# Patient Record
Sex: Male | Born: 1935 | Race: Black or African American | Hispanic: No | Smoking: Never smoker
Health system: Southern US, Community
[De-identification: ages and names within clinical notes are randomized; demographics above are authoritative.]

## PROBLEM LIST (undated history)

## (undated) DIAGNOSIS — J449 Chronic obstructive pulmonary disease, unspecified: Secondary | ICD-10-CM

## (undated) DIAGNOSIS — I509 Heart failure, unspecified: Secondary | ICD-10-CM

## (undated) DIAGNOSIS — C801 Malignant (primary) neoplasm, unspecified: Secondary | ICD-10-CM

## (undated) DIAGNOSIS — E785 Hyperlipidemia, unspecified: Secondary | ICD-10-CM

## (undated) DIAGNOSIS — I2510 Atherosclerotic heart disease of native coronary artery without angina pectoris: Secondary | ICD-10-CM

## (undated) DIAGNOSIS — F0390 Unspecified dementia without behavioral disturbance: Secondary | ICD-10-CM

## (undated) DIAGNOSIS — I429 Cardiomyopathy, unspecified: Secondary | ICD-10-CM

## (undated) DIAGNOSIS — I10 Essential (primary) hypertension: Secondary | ICD-10-CM

## (undated) DIAGNOSIS — M1990 Unspecified osteoarthritis, unspecified site: Secondary | ICD-10-CM

## (undated) HISTORY — DX: Cardiomyopathy, unspecified: I42.9

## (undated) HISTORY — DX: Unspecified osteoarthritis, unspecified site: M19.90

## (undated) HISTORY — PX: CARDIAC PACEMAKER PLACEMENT: SHX583

## (undated) HISTORY — DX: Hyperlipidemia, unspecified: E78.5

## (undated) HISTORY — DX: Unspecified dementia, unspecified severity, without behavioral disturbance, psychotic disturbance, mood disturbance, and anxiety: F03.90

---

## 2009-02-28 ENCOUNTER — Ambulatory Visit: Payer: Self-pay | Admitting: Diagnostic Radiology

## 2009-02-28 ENCOUNTER — Emergency Department (HOSPITAL_BASED_OUTPATIENT_CLINIC_OR_DEPARTMENT_OTHER): Admission: EM | Admit: 2009-02-28 | Discharge: 2009-02-28 | Payer: Self-pay | Admitting: Emergency Medicine

## 2010-08-24 LAB — BASIC METABOLIC PANEL
Chloride: 107 mEq/L (ref 96–112)
Creatinine, Ser: 1.2 mg/dL (ref 0.4–1.5)
GFR calc Af Amer: >60 mL/min (ref >60)
GFR calc non Af Amer: 58 mL/min — ABNORMAL LOW (ref >60)

## 2010-08-24 LAB — POCT CARDIAC MARKERS
CKMB, poc: 2 ng/mL (ref 1.0–8.0)
Myoglobin, poc: 173 ng/mL (ref 12–200)
Troponin i, poc: <0.05 ng/mL (ref 0.00–0.09)

## 2010-08-24 LAB — DIFFERENTIAL
Lymphocytes Relative: 8 % — ABNORMAL LOW (ref 12–46)
Lymphs Abs: 1.1 10*3/uL (ref 0.7–4.0)
Monocytes Absolute: 0.8 10*3/uL (ref 0.1–1.0)
Neutrophils Relative %: 85 % — ABNORMAL HIGH (ref 43–77)

## 2010-08-24 LAB — CBC
MCV: 100.1 fL — ABNORMAL HIGH (ref 78.0–100.0)
Platelets: 227 10*3/uL (ref 150–400)
RBC: 3.5 MIL/uL — ABNORMAL LOW (ref 4.22–5.81)
RDW: 13.2 % (ref 11.5–15.5)
WBC: 14.4 10*3/uL — ABNORMAL HIGH (ref 4.0–10.5)

## 2011-08-20 DIAGNOSIS — M549 Dorsalgia, unspecified: Secondary | ICD-10-CM | POA: Insufficient documentation

## 2012-10-25 DIAGNOSIS — Z7189 Other specified counseling: Secondary | ICD-10-CM | POA: Insufficient documentation

## 2012-10-25 DIAGNOSIS — R209 Unspecified disturbances of skin sensation: Secondary | ICD-10-CM | POA: Insufficient documentation

## 2012-10-25 DIAGNOSIS — M159 Polyosteoarthritis, unspecified: Secondary | ICD-10-CM | POA: Insufficient documentation

## 2012-10-25 DIAGNOSIS — R269 Unspecified abnormalities of gait and mobility: Secondary | ICD-10-CM | POA: Insufficient documentation

## 2013-03-02 DIAGNOSIS — R7309 Other abnormal glucose: Secondary | ICD-10-CM | POA: Insufficient documentation

## 2013-03-02 DIAGNOSIS — G4700 Insomnia, unspecified: Secondary | ICD-10-CM | POA: Insufficient documentation

## 2013-03-02 DIAGNOSIS — R197 Diarrhea, unspecified: Secondary | ICD-10-CM | POA: Insufficient documentation

## 2013-06-11 DIAGNOSIS — I509 Heart failure, unspecified: Secondary | ICD-10-CM | POA: Insufficient documentation

## 2013-06-11 DIAGNOSIS — F0391 Unspecified dementia with behavioral disturbance: Secondary | ICD-10-CM | POA: Insufficient documentation

## 2013-06-11 DIAGNOSIS — I428 Other cardiomyopathies: Secondary | ICD-10-CM | POA: Insufficient documentation

## 2013-06-11 DIAGNOSIS — I429 Cardiomyopathy, unspecified: Secondary | ICD-10-CM | POA: Insufficient documentation

## 2013-06-11 DIAGNOSIS — I10 Essential (primary) hypertension: Secondary | ICD-10-CM | POA: Insufficient documentation

## 2013-06-11 DIAGNOSIS — F03918 Unspecified dementia, unspecified severity, with other behavioral disturbance: Secondary | ICD-10-CM | POA: Insufficient documentation

## 2013-06-11 DIAGNOSIS — I5022 Chronic systolic (congestive) heart failure: Secondary | ICD-10-CM | POA: Insufficient documentation

## 2013-06-11 DIAGNOSIS — F0390 Unspecified dementia without behavioral disturbance: Secondary | ICD-10-CM

## 2013-12-24 DIAGNOSIS — R200 Anesthesia of skin: Secondary | ICD-10-CM | POA: Insufficient documentation

## 2013-12-24 DIAGNOSIS — R739 Hyperglycemia, unspecified: Secondary | ICD-10-CM | POA: Insufficient documentation

## 2013-12-24 DIAGNOSIS — M25551 Pain in right hip: Secondary | ICD-10-CM | POA: Insufficient documentation

## 2013-12-24 DIAGNOSIS — N400 Enlarged prostate without lower urinary tract symptoms: Secondary | ICD-10-CM | POA: Insufficient documentation

## 2014-05-10 DIAGNOSIS — E785 Hyperlipidemia, unspecified: Secondary | ICD-10-CM | POA: Insufficient documentation

## 2014-08-30 DIAGNOSIS — M1611 Unilateral primary osteoarthritis, right hip: Secondary | ICD-10-CM | POA: Insufficient documentation

## 2014-11-25 DIAGNOSIS — Z95810 Presence of automatic (implantable) cardiac defibrillator: Secondary | ICD-10-CM | POA: Insufficient documentation

## 2014-11-26 DIAGNOSIS — J439 Emphysema, unspecified: Secondary | ICD-10-CM | POA: Insufficient documentation

## 2015-07-08 DIAGNOSIS — R32 Unspecified urinary incontinence: Secondary | ICD-10-CM | POA: Insufficient documentation

## 2015-07-29 DIAGNOSIS — E559 Vitamin D deficiency, unspecified: Secondary | ICD-10-CM | POA: Insufficient documentation

## 2015-11-16 DIAGNOSIS — H547 Unspecified visual loss: Secondary | ICD-10-CM | POA: Insufficient documentation

## 2015-11-16 DIAGNOSIS — Z9181 History of falling: Secondary | ICD-10-CM | POA: Insufficient documentation

## 2016-02-15 DIAGNOSIS — J3089 Other allergic rhinitis: Secondary | ICD-10-CM | POA: Insufficient documentation

## 2016-11-09 DIAGNOSIS — D649 Anemia, unspecified: Secondary | ICD-10-CM | POA: Insufficient documentation

## 2017-01-25 DIAGNOSIS — S2241XA Multiple fractures of ribs, right side, initial encounter for closed fracture: Secondary | ICD-10-CM | POA: Insufficient documentation

## 2017-05-26 DIAGNOSIS — M48061 Spinal stenosis, lumbar region without neurogenic claudication: Secondary | ICD-10-CM | POA: Insufficient documentation

## 2017-06-21 DIAGNOSIS — R3915 Urgency of urination: Secondary | ICD-10-CM | POA: Insufficient documentation

## 2017-10-20 ENCOUNTER — Emergency Department (HOSPITAL_BASED_OUTPATIENT_CLINIC_OR_DEPARTMENT_OTHER)
Admission: EM | Admit: 2017-10-20 | Discharge: 2017-10-20 | Disposition: A | Discharge status: Home / Self Care | Payer: Medicare Other | Attending: Emergency Medicine | Admitting: Emergency Medicine

## 2017-10-20 ENCOUNTER — Other Ambulatory Visit: Payer: Self-pay

## 2017-10-20 ENCOUNTER — Encounter (HOSPITAL_BASED_OUTPATIENT_CLINIC_OR_DEPARTMENT_OTHER): Payer: Self-pay | Admitting: Emergency Medicine

## 2017-10-20 DIAGNOSIS — Y998 Other external cause status: Secondary | ICD-10-CM | POA: Insufficient documentation

## 2017-10-20 DIAGNOSIS — J449 Chronic obstructive pulmonary disease, unspecified: Secondary | ICD-10-CM | POA: Diagnosis not present

## 2017-10-20 DIAGNOSIS — I2510 Atherosclerotic heart disease of native coronary artery without angina pectoris: Secondary | ICD-10-CM | POA: Insufficient documentation

## 2017-10-20 DIAGNOSIS — Y9389 Activity, other specified: Secondary | ICD-10-CM | POA: Diagnosis not present

## 2017-10-20 DIAGNOSIS — Z79899 Other long term (current) drug therapy: Secondary | ICD-10-CM | POA: Insufficient documentation

## 2017-10-20 DIAGNOSIS — Z7902 Long term (current) use of antithrombotics/antiplatelets: Secondary | ICD-10-CM | POA: Diagnosis not present

## 2017-10-20 DIAGNOSIS — I5022 Chronic systolic (congestive) heart failure: Secondary | ICD-10-CM | POA: Diagnosis not present

## 2017-10-20 DIAGNOSIS — I110 Hypertensive heart disease with heart failure: Secondary | ICD-10-CM | POA: Diagnosis not present

## 2017-10-20 DIAGNOSIS — Z859 Personal history of malignant neoplasm, unspecified: Secondary | ICD-10-CM | POA: Insufficient documentation

## 2017-10-20 DIAGNOSIS — S6992XA Unspecified injury of left wrist, hand and finger(s), initial encounter: Secondary | ICD-10-CM | POA: Diagnosis present

## 2017-10-20 DIAGNOSIS — S61412A Laceration without foreign body of left hand, initial encounter: Secondary | ICD-10-CM

## 2017-10-20 DIAGNOSIS — W228XXA Striking against or struck by other objects, initial encounter: Secondary | ICD-10-CM | POA: Diagnosis not present

## 2017-10-20 DIAGNOSIS — Y929 Unspecified place or not applicable: Secondary | ICD-10-CM | POA: Diagnosis not present

## 2017-10-20 DIAGNOSIS — Z23 Encounter for immunization: Secondary | ICD-10-CM | POA: Insufficient documentation

## 2017-10-20 HISTORY — DX: Essential (primary) hypertension: I10

## 2017-10-20 HISTORY — DX: Malignant (primary) neoplasm, unspecified: C80.1

## 2017-10-20 HISTORY — DX: Heart failure, unspecified: I50.9

## 2017-10-20 HISTORY — DX: Atherosclerotic heart disease of native coronary artery without angina pectoris: I25.10

## 2017-10-20 HISTORY — DX: Chronic obstructive pulmonary disease, unspecified: J44.9

## 2017-10-20 MED ORDER — TETANUS-DIPHTH-ACELL PERTUSSIS 5-2.5-18.5 LF-MCG/0.5 IM SUSP
0.5000 mL | Freq: Once | INTRAMUSCULAR | Status: AC
  Administered 2017-10-20: 0.5 mL via INTRAMUSCULAR
  Filled 2017-10-20: qty 0.5

## 2017-10-20 NOTE — ED Provider Notes (Signed)
[facility] Provider Note   CSN: [id number] Arrival date & time: 10/20/17  1945     History   Chief Complaint Chief Complaint  Patient presents with  . Extremity Laceration    HPI Maurice Mckee is a 84 y.o. male.  Patient is a 84 year old male with a history of CHF, COPD, coronary artery disease and hypertension who presents with an injury to his left hand.  He was opening a door and scraped his hand on the latch.  He has 2 skin tears on his hand.  He has no other injuries.  Is unsure when his last tetanus shot was.     Past Medical History:  Diagnosis Date  . Cancer ([place])   . CHF (congestive heart failure) ([place])   . COPD (chronic obstructive pulmonary disease) ([place])   . Coronary artery disease   . Hypertension     Patient Active Problem List   Diagnosis Date Noted  . 'light-for-dates' infant with signs of fetal malnutrition 09/09/2017  . Urinary urgency 06/21/2017  . Spinal stenosis of lumbar region 05/26/2017  . Fracture of two ribs of right side, closed, initial encounter 01/25/2017  . Anemia, normocytic normochromic 11/09/2016  . Non-seasonal allergic rhinitis 02/15/2016  . Risk for falls 11/16/2015  . Vision problem 11/16/2015  . Vitamin D deficiency 07/29/2015  . Urinary incontinence 07/08/2015  . Pulmonary emphysema ([place]) 11/26/2014  . 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 Primary osteoarthritis of right hip 08/30/2014  . Hyperlipidemia 05/10/2014  . Arthralgia of right hip 12/24/2013  . Enlarged prostate without lower urinary tract symptoms (luts) 12/24/2013  . Hyperglycemia 12/24/2013  . Numbness 12/24/2013  . Chronic systolic congestive heart failure ([employer]) 06/11/2013  . Congestive heart failure ([place]) 06/11/2013  . Dementia without behavioral disturbance 06/11/2013  . Essential hypertension 06/11/2013  . Primary cardiomyopathy ([place]) 06/11/2013  . Abnormal glucose 03/02/2013  . Diarrhea  03/02/2013  . Insomnia 03/02/2013  . Abnormality of gait 10/25/2012  . Encounter for counseling 10/25/2012  . Generalized osteoarthritis of multiple sites 10/25/2012  . Skin sensation disturbance 10/25/2012  . Backache 08/20/2011  . Other allergy, other than to medicinal agents 01/19/2010    History reviewed. No pertinent surgical history.      Home Medications    Prior to Admission medications   Medication Sig Start Date End Date Taking? Authorizing Provider  atorvastatin (LIPITOR) 40 MG tablet 1 tablet. 11/09/16  Yes [provider]  carvedilol (COREG) 12.5 MG tablet Take 1 tablet by mouth. 11/09/16  Yes [provider]  clopidogrel (PLAVIX) 75 MG tablet 1 tablet. 11/09/16  Yes [provider]  donepezil (ARICEPT) 10 MG tablet Take 1 tablet by mouth. 11/09/16  Yes [provider]  furosemide (LASIX) 20 MG tablet Take 1 tablet by mouth. 07/23/16  Yes [provider]  gabapentin (NEURONTIN) 100 MG capsule 100 mg. 10/18/16  Yes [provider]  memantine (NAMENDA) 10 MG tablet Take 1 tablet by mouth. 08/28/17  Yes [provider]  potassium chloride (K-DUR,KLOR-CON) 10 MEQ tablet Take 1 tablet by mouth. 11/09/16  Yes [provider]  solifenacin (VESICARE) 5 MG tablet Take 1 tablet by mouth. 11/09/16 11/09/17 Yes [provider]  Calcium Carbonate-Vitamin D3 600-400 MG-UNIT TABS Take 1 tablet by mouth.    [provider]  ferrous gluconate (FERGON) 324 MG tablet Take 1 tablet by mouth.    [provider]    Family History History reviewed. No pertinent family  history.  Social History Social History   Tobacco Use  . Smoking status: Never Smoker  . Smokeless tobacco: Never Used  Substance Use Topics  . Alcohol use: Never    Frequency: Never  . Drug use: Never     Allergies   Patient has no known allergies.   Review of Systems Review of Systems  Constitutional: Negative for fever.   Gastrointestinal: Negative for nausea and vomiting.  Musculoskeletal: Negative for arthralgias, back pain, joint swelling and neck pain.  Skin: Positive for wound.  Neurological: Negative for weakness, numbness and headaches.     Physical Exam Updated Vital Signs BP 137/78 (BP Location: Right Arm)   Pulse 72   Temp 98 F (36.7 C) (Oral)   Resp 20   Ht 5\' 11"  (1.803 m)   Wt 65.8 kg (145 lb)   SpO2 95%   BMI 20.22 kg/m   Physical Exam  Constitutional: He is oriented to person, place, and time. He appears well-developed and well-nourished.  HENT:  Head: Normocephalic and atraumatic.  Neck: Normal range of motion. Neck supple.  Cardiovascular: Normal rate.  Pulmonary/Chest: Effort normal.  Musculoskeletal: He exhibits no edema or tenderness.  Patient has a 2 to 3 cm skin tear on the dorsal surface of his left hand overlying the fourth and fifth metacarpal bones.  He also has a small less than 1 cm skin tear on the dorsal surface of his fifth finger.  There is no underlying bony tenderness.  No active bleeding.  He has normal sensation and motor function of the hand.  Capillary refill normal distally.  Neurological: He is alert and oriented to person, place, and time.  Skin: Skin is warm and dry.  Psychiatric: He has a normal mood and affect.     ED Treatments / Results  Labs (all labs ordered are listed, but only abnormal results are displayed) Labs Reviewed - No data to display  EKG None  Radiology No results found.  Procedures Procedures (including critical care time)  Medications Ordered in ED Medications  Tdap (BOOSTRIX) injection 0.5 mL (0.5 mLs Intramuscular Given 10/20/17 2311)     Initial Impression / Assessment and Plan / ED Course  I have reviewed the triage vital signs and the nursing notes.  Pertinent labs & imaging results that were available during my care of the patient were reviewed by me and considered in my medical decision making (see chart for  details).     Patient has 2 small skin tears to his hand.  They are not suturable.  He has no underlying bony tenderness.  No active bleeding.  His wound was cleaned and dressed.  His tetanus shot was updated.  He was given ongoing wound care instructions and return precautions.  Final Clinical Impressions(s) / ED Diagnoses   Final diagnoses:  Skin tear of left hand without complication, initial encounter    ED Discharge Orders    None       Malvin Johns, MD 10/20/17 2324

## 2017-10-20 NOTE — ED Triage Notes (Signed)
Patient states that he cut the outside of his left hand on the door jam at home. Patient has noted 2 skin tears to his left hand  - bleeding controlled

## 2017-10-20 NOTE — ED Notes (Signed)
Skin tear on left hand dressed and cleansed with mepital dressing and wound cleanser.

## 2018-03-10 ENCOUNTER — Encounter: Payer: Self-pay | Admitting: Internal Medicine

## 2018-03-10 ENCOUNTER — Non-Acute Institutional Stay (SKILLED_NURSING_FACILITY): Payer: Medicare Other | Admitting: Internal Medicine

## 2018-03-10 DIAGNOSIS — G301 Alzheimer's disease with late onset: Secondary | ICD-10-CM

## 2018-03-10 DIAGNOSIS — I10 Essential (primary) hypertension: Secondary | ICD-10-CM

## 2018-03-10 DIAGNOSIS — S32591D Other specified fracture of right pubis, subsequent encounter for fracture with routine healing: Secondary | ICD-10-CM | POA: Diagnosis not present

## 2018-03-10 DIAGNOSIS — R296 Repeated falls: Secondary | ICD-10-CM | POA: Diagnosis not present

## 2018-03-10 DIAGNOSIS — R410 Disorientation, unspecified: Secondary | ICD-10-CM

## 2018-03-10 DIAGNOSIS — F0280 Dementia in other diseases classified elsewhere without behavioral disturbance: Secondary | ICD-10-CM

## 2018-03-10 DIAGNOSIS — N400 Enlarged prostate without lower urinary tract symptoms: Secondary | ICD-10-CM

## 2018-03-10 DIAGNOSIS — E785 Hyperlipidemia, unspecified: Secondary | ICD-10-CM

## 2018-03-10 NOTE — Progress Notes (Signed)
:  Location:  [place] Room Number: [phone] Place of Service:  SNF ([id number])  Marda Breidenbach D. Sheppard Coil, MD  Patient Care Team: Patient, No Pcp Per as PCP - General (General Practice)  Extended Emergency Contact Information Primary Emergency Contact: [place] Phone: [phone] Relation: None     Allergies: Patient has no known allergies.  Chief Complaint  Patient presents with  . New Admit To SNF    Admit to [facility]    HPI: Patient is 84 y.o. male with hypertension, dementia, chronic diastolic and systolic congestive heart failure, BPH, and multiple falls who presented to [facility] emergency department complaining of a fall day with right hip pain since he was taken to his primary care physician where an x-ray of the hip was obtained and showed a remote inferior pubic rami fracture and he was told to come to the emergency department.  Patient was admitted to [facility] from 10/11-18 for multiple falls.  Work-up was negative.  Hospital course was complicated by one episode of confusion and required Haldol.  Also patient had a couple episodes of diarrhea in the hospital which was negative for C. difficile.  Patient is admitted to skilled nursing facility for OT/PT.  While at skilled nursing facility patient will be followed for dementia treated with Aricept, BPH treated with Flomax and hyperlipidemia treated with Lipitor.  Past Medical History:  Diagnosis Date  . Arthritis   . Cancer ([place])   . Cardiomyopathy ([place])   . CHF (congestive heart failure) ([place])   . COPD (chronic obstructive pulmonary disease) ([place])   . Coronary artery disease   . Dementia ([place])   . Hyperlipidemia   . Hypertension     Past Surgical History:  Procedure Laterality Date  . CARDIAC PACEMAKER PLACEMENT      Allergies as of 03/10/2018   No Known Allergies     Medication List        Accurate as of 03/10/18 11:09 AM. Always use your most  recent med list.          atorvastatin 40 MG tablet Commonly known as:  LIPITOR 1 tablet.   carvedilol 12.5 MG tablet Commonly known as:  COREG Take 1 tablet by mouth.   cholecalciferol 1000 units tablet Commonly known as:  VITAMIN D Take 1,000 Units by mouth daily.   clopidogrel 75 MG tablet Commonly known as:  PLAVIX 1 tablet.   diphenoxylate-atropine 2.5-0.025 MG tablet Commonly known as:  LOMOTIL Take by mouth 4 (four) times daily as needed for diarrhea or loose stools.   donepezil 10 MG tablet Commonly known as:  ARICEPT Take 1 tablet by mouth.   ferrous gluconate 324 MG tablet Commonly known as:  FERGON Take 1 tablet by mouth.   tamsulosin 0.4 MG Caps capsule Commonly known as:  FLOMAX Take 0.4 mg by mouth.       No orders of the defined types were placed in this encounter.   Immunization History  Administered Date(s) Administered  . Tdap 10/20/2017    Social History   Tobacco Use  . Smoking status: Never Smoker  . Smokeless tobacco: Never Used  Substance Use Topics  . Alcohol use: Never    Frequency: Never    Family history is   Family History  Problem Relation Age of Onset  . Cancer Mother       Review of Systems  DATA OBTAINED: from patient, nurse GENERAL:  no fevers,  fatigue, appetite changes SKIN: No itching, or rash EYES: No eye pain, redness, discharge EARS: No earache, tinnitus, change in hearing NOSE: No congestion, drainage or bleeding  MOUTH/THROAT: No mouth or tooth pain, No sore throat RESPIRATORY: No cough, wheezing, SOB CARDIAC: No chest pain, palpitations, lower extremity edema  GI: No abdominal pain, No N/V/D or constipation, No heartburn or reflux  GU: No dysuria, frequency or urgency, or incontinence  MUSCULOSKELETAL: No unrelieved bone/joint pain NEUROLOGIC: No headache, dizziness or focal weakness PSYCHIATRIC: No c/o anxiety or sadness   Vitals:   03/10/18 1047  BP: 111/71  Pulse: 77  Resp: 17  Temp:  (!) 97.3 F (36.3 C)    SpO2 Readings from Last 1 Encounters:  10/20/17 99%   There is no height or weight on file to calculate BMI.     Physical Exam  GENERAL APPEARANCE: Alert, conversant,  No acute distress.  SKIN: No diaphoresis rash HEAD: Normocephalic, atraumatic  EYES: Conjunctiva/lids clear. Pupils round, reactive. EOMs intact.  EARS: External exam WNL, canals clear. Hearing grossly normal.  NOSE: No deformity or discharge.  MOUTH/THROAT: Lips w/o lesions  RESPIRATORY: Breathing is even, unlabored. Lung sounds are clear   CARDIOVASCULAR: Heart RRR no murmurs, rubs or gallops. No peripheral edema.   GASTROINTESTINAL: Abdomen is soft, non-tender, not distended w/ normal bowel sounds. GENITOURINARY: Bladder non tender, not distended  MUSCULOSKELETAL: No abnormal joints or musculature NEUROLOGIC:  Cranial nerves 2-12 grossly intact. Moves all extremities  PSYCHIATRIC: Mood and affect appropriate to situation, no behavioral issues  Patient Active Problem List   Diagnosis Date Noted  . 'light-for-dates' infant with signs of fetal malnutrition 09/09/2017  . Urinary urgency 06/21/2017  . Spinal stenosis of lumbar region 05/26/2017  . Fracture of two ribs of right side, closed, initial encounter 01/25/2017  . Anemia, normocytic normochromic 11/09/2016  . Non-seasonal allergic rhinitis 02/15/2016  . Risk for falls 11/16/2015  . Vision problem 11/16/2015  . Vitamin D deficiency 07/29/2015  . Urinary incontinence 07/08/2015  . Pulmonary emphysema ([place]) 11/26/2014  . 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 Primary osteoarthritis of right hip 08/30/2014  . Hyperlipidemia 05/10/2014  . Arthralgia of right hip 12/24/2013  . Enlarged prostate without lower urinary tract symptoms (luts) 12/24/2013  . Hyperglycemia 12/24/2013  . Numbness 12/24/2013  . Chronic systolic congestive heart failure ([place]) 06/11/2013  . Congestive heart failure ([place])  06/11/2013  . Dementia without behavioral disturbance ([place]) 06/11/2013  . Essential hypertension 06/11/2013  . Primary cardiomyopathy ([place]) 06/11/2013  . Abnormal glucose 03/02/2013  . Diarrhea 03/02/2013  . Insomnia 03/02/2013  . Abnormality of gait 10/25/2012  . Encounter for counseling 10/25/2012  . Generalized osteoarthritis of multiple sites 10/25/2012  . Skin sensation disturbance 10/25/2012  . Backache 08/20/2011  . Other allergy, other than to medicinal agents 01/19/2010      Labs reviewed: Basic Metabolic Panel:    Component Value Date/Time   NA 140 02/28/2009 1520   K 4.9 02/28/2009 1520   CL 107 02/28/2009 1520   CO2 28 02/28/2009 1520   GLUCOSE 100 (H) 02/28/2009 1520   BUN 29 (H) 02/28/2009 1520   CREATININE 1.2 02/28/2009 1520   CALCIUM 9.1 02/28/2009 1520   GFRNONAA 58 (L) 02/28/2009 1520   GFRAA  02/28/2009 1520    >60        The eGFR has been calculated using the MDRD equation. This calculation has not been validated in all clinical situations. eGFR's persistently <60 mL/min signify possible Chronic  Kidney Disease.    No results for input(s): NA, K, CL, CO2, GLUCOSE, BUN, CREATININE, CALCIUM, MG, PHOS in the last 8760 hours. Liver Function Tests: No results for input(s): AST, ALT, ALKPHOS, BILITOT, PROT, ALBUMIN in the last 8760 hours. No results for input(s): LIPASE, AMYLASE in the last 8760 hours. No results for input(s): AMMONIA in the last 8760 hours. CBC: No results for input(s): WBC, NEUTROABS, HGB, HCT, MCV, PLT in the last 8760 hours. Lipid No results for input(s): CHOL, HDL, LDLCALC, TRIG in the last 8760 hours.  Cardiac Enzymes: No results for input(s): CKTOTAL, CKMB, CKMBINDEX, TROPONINI in the last 8760 hours. BNP: No results for input(s): BNP in the last 8760 hours. No results found for: MICROALBUR No results found for: HGBA1C No results found for: TSH No results found for: VITAMINB12 No results found for: FOLATE No results  found for: IRON, TIBC, FERRITIN  Imaging and Procedures obtained prior to SNF admission: No results found.   Not all labs, radiology exams or other studies done during hospitalization come through on my EPIC note; however they are reviewed by me.    Assessment and Plan  Multiple falls/remote right inferior pubic rami fracture- fall precautions; ICD was interrogated and was normal, EKG noted atrial paced rhythm. 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 Sheppard Coil, MD

## 2018-03-12 LAB — CBC AND DIFFERENTIAL
HCT: 40 — AB (ref 41–53)
Hemoglobin: 13.4 — AB (ref 13.5–17.5)
Platelets: 151 (ref 150–399)
WBC: 10.1

## 2018-03-12 LAB — BASIC METABOLIC PANEL
BUN: 22 — AB (ref 4–21)
CREATININE: 1 (ref 0.6–1.3)
Glucose: 106
POTASSIUM: 4.4 (ref 3.4–5.3)
Sodium: 141 (ref 137–147)

## 2018-03-18 ENCOUNTER — Encounter: Payer: Self-pay | Admitting: Internal Medicine

## 2018-03-18 DIAGNOSIS — S32599A Other specified fracture of unspecified pubis, initial encounter for closed fracture: Secondary | ICD-10-CM | POA: Insufficient documentation

## 2018-03-18 DIAGNOSIS — N400 Enlarged prostate without lower urinary tract symptoms: Secondary | ICD-10-CM | POA: Insufficient documentation

## 2018-03-18 DIAGNOSIS — R296 Repeated falls: Secondary | ICD-10-CM | POA: Insufficient documentation

## 2018-03-18 DIAGNOSIS — R410 Disorientation, unspecified: Secondary | ICD-10-CM | POA: Insufficient documentation

## 2018-03-25 ENCOUNTER — Non-Acute Institutional Stay (SKILLED_NURSING_FACILITY): Payer: Medicare Other | Admitting: Internal Medicine

## 2018-03-25 ENCOUNTER — Encounter: Payer: Self-pay | Admitting: Internal Medicine

## 2018-03-25 DIAGNOSIS — R410 Disorientation, unspecified: Secondary | ICD-10-CM

## 2018-03-25 DIAGNOSIS — G301 Alzheimer's disease with late onset: Secondary | ICD-10-CM

## 2018-03-25 DIAGNOSIS — R296 Repeated falls: Secondary | ICD-10-CM | POA: Diagnosis not present

## 2018-03-25 DIAGNOSIS — I10 Essential (primary) hypertension: Secondary | ICD-10-CM

## 2018-03-25 DIAGNOSIS — F0280 Dementia in other diseases classified elsewhere without behavioral disturbance: Secondary | ICD-10-CM

## 2018-03-25 DIAGNOSIS — S32591D Other specified fracture of right pubis, subsequent encounter for fracture with routine healing: Secondary | ICD-10-CM | POA: Diagnosis not present

## 2018-03-25 DIAGNOSIS — N400 Enlarged prostate without lower urinary tract symptoms: Secondary | ICD-10-CM

## 2018-03-25 DIAGNOSIS — E785 Hyperlipidemia, unspecified: Secondary | ICD-10-CM

## 2018-03-25 LAB — BASIC METABOLIC PANEL
BUN: 19 (ref 4–21)
Creatinine: 1.1 (ref 0.6–1.3)
Glucose: 137
POTASSIUM: 4 (ref 3.4–5.3)
SODIUM: 140 (ref 137–147)

## 2018-03-25 NOTE — Progress Notes (Signed)
Location:  [place] Room Number: [phone] Place of Service:  SNF [phone])  Maurice Delaine. Sheppard Coil, MD  Patient Care Team: Patient, Maurice Mckee Per as Mckee - General (General Practice)  Extended Emergency Contact Information Primary Emergency Contact: [place] Phone: [phone] Relation: None  Maurice Mckee Allergies  Chief Complaint  Patient presents with  . Discharge Note    Discharge from [facility]    HPI:  84 y.o. male with hypertension, dementia, chronic diastolic and systolic congestive heart failure, BPH, and multiple falls who presented to [facility] emergency department complaining of a fall the day prior with right hip pain since.  He was taken to his primary care physician where an x-ray of the hip was obtained and showed a remote inferior pubic ramus fracture and he was told to come to the emergency department.  Patient was admitted to [facility] from 10/11-18 for multiple falls.  Work-up was negative.  Hospital course was complicated by one episode of confusion which required Haldol.  Also patient had a couple episodes of diarrhea in the hospital which were negative for C. difficile.  Patient was admitted to skilled nursing facility for OT/PT and is now ready to be discharged home.    Past Medical History:  Diagnosis Date  . Arthritis   . Cancer ([place])   . Cardiomyopathy ([place])   . CHF (congestive heart failure) ([place])   . COPD (chronic obstructive pulmonary disease) ([place])   . Coronary artery disease   . Dementia ([place])   . Hyperlipidemia   . Hypertension     Past Surgical History:  Procedure Laterality Date  . CARDIAC PACEMAKER PLACEMENT       reports that he has never smoked. He has never used smokeless tobacco. He reports that he does not drink alcohol or use drugs. Social History   Socioeconomic History  . Marital status: Widowed    Spouse Mckee: Not on file  . Number of children: Not on file  . Years of  education: Not on file  . Highest education level: Not on file  Occupational History  . Not on file  Social Needs  . Financial resource strain: Not on file  . Food insecurity:    Worry: Not on file    Inability: Not on file  . Transportation needs:    Medical: Not on file    Non-medical: Not on file  Tobacco Use  . Smoking status: Never Smoker  . Smokeless tobacco: Never Used  Substance and Sexual Activity  . Alcohol use: Never    Frequency: Never  . Drug use: Never  . Sexual activity: Not on file  Lifestyle  . Physical activity:    Days per week: Not on file    Minutes per session: Not on file  . Stress: Not on file  Relationships  . Social connections:    Talks on phone: Not on file    Gets together: Not on file    Attends religious service: Not on file    Active member of club or organization: Not on file    Attends meetings of clubs or organizations: Not on file    Relationship status: Not on file  . Intimate partner violence:    Fear of current or ex partner: Not on file    Emotionally abused: Not on file    Physically abused: Not on file    Forced sexual activity: Not on file  Other Topics Concern  . Not on file  Social History Narrative  . Not on file    Pertinent  Health Maintenance Due  Topic Date Due  . PNA vac Low Risk Adult (1 of 2 - PCV13) 11/22/1992  . INFLUENZA VACCINE  12/19/2017    Medications: Allergies as of 03/25/2018   Maurice Mckee Allergies     Medication List        Accurate as of 03/25/18  7:14 PM. Always use your most recent med list.          atorvastatin 40 MG tablet Commonly Mckee as:  LIPITOR 1 tablet.   carvedilol 12.5 MG tablet Commonly Mckee as:  COREG Take 1 tablet by mouth.   cholecalciferol 1000 units tablet Commonly Mckee as:  VITAMIN D Take 1,000 Units by mouth daily.   clopidogrel 75 MG tablet Commonly Mckee as:  PLAVIX 1 tablet.   donepezil 10 MG tablet Commonly Mckee as:  ARICEPT Take 10 mg by mouth at  bedtime.   furosemide 40 MG tablet Commonly Mckee as:  LASIX Take 40 mg by mouth daily.   tamsulosin 0.4 MG Caps capsule Commonly Mckee as:  FLOMAX Take 0.4 mg by mouth.        Vitals:   03/25/18 0958  BP: 110/62  Pulse: 69  Resp: 18  Temp: 97.9 F (36.6 C)  Weight: 145 lb (65.8 kg)  Height: 5\' 11"  (1.803 m)   Body mass index is 20.22 kg/m.  Physical Exam  GENERAL APPEARANCE: Alert, conversant. Maurice acute distress.  HEENT: Unremarkable. RESPIRATORY: Breathing is even, unlabored. Lung sounds are clear   CARDIOVASCULAR: Heart RRR Maurice murmurs, rubs or gallops. Maurice peripheral edema.  GASTROINTESTINAL: Abdomen is soft, non-tender, not distended w/ normal bowel sounds.  NEUROLOGIC: Cranial nerves 2-12 grossly intact. Moves all extremities   Labs reviewed: Basic Metabolic Panel: Recent Labs    03/12/18  NA 141  K 4.4  BUN 22*  CREATININE 1.0   Maurice results found for: [facility] Liver Function Tests: Maurice results for input(s): AST, ALT, ALKPHOS, BILITOT, PROT, ALBUMIN in the last 8760 hours. Maurice results for input(s): LIPASE, AMYLASE in the last 8760 hours. Maurice results for input(s): AMMONIA in the last 8760 hours. CBC: Recent Labs    03/12/18  WBC 10.1  HGB 13.4*  HCT 40*  PLT 151   Lipid Maurice results for input(s): CHOL, HDL, LDLCALC, TRIG in the last 8760 hours. Cardiac Enzymes: Maurice results for input(s): CKTOTAL, CKMB, CKMBINDEX, TROPONINI in the last 8760 hours. BNP: No results for input(s): BNP in the last 8760 hours. CBG: Maurice results for input(s): GLUCAP in the last 8760 hours.  Procedures and Imaging Studies During Stay: Maurice results found.  Assessment/Plan:   Multiple falls  Closed fracture of right inferior pubic ramus with routine healing, subsequent encounter  Delirium  Late onset Alzheimer's disease without behavioral disturbance ([place])  Essential hypertension  Hyperlipidemia, unspecified hyperlipidemia type  Benign prostatic hyperplasia without  lower urinary tract symptoms   Patient is being discharged with the following home health services: OT/PT  Patient is being discharged with the following durable medical equipment: None  Patient has been advised to f/u with their Mckee in 1-2 weeks to bring them up to date on their rehab stay.  Social services at facility was responsible for arranging this appointment.  Pt was provided with a 30 day supply of prescriptions for medications and refills must be obtained from their Mckee.  For controlled substances, a more  limited supply may be provided adequate until Mckee appointment only.  Medications have been reconciled.  Time spent greater than 35 minutes Olumide Dolinger D. Sheppard Coil, MD

## 2018-10-10 ENCOUNTER — Other Ambulatory Visit: Payer: Self-pay

## 2018-10-10 ENCOUNTER — Emergency Department (HOSPITAL_COMMUNITY): Payer: Medicare Other

## 2018-10-10 ENCOUNTER — Encounter (HOSPITAL_COMMUNITY): Payer: Self-pay | Admitting: Emergency Medicine

## 2018-10-10 ENCOUNTER — Inpatient Hospital Stay (HOSPITAL_COMMUNITY)
Admission: EM | Admit: 2018-10-10 | Discharge: 2018-10-21 | DRG: 480 | Disposition: A | Discharge status: Skilled Nursing Facility | Payer: Medicare Other | Source: Skilled Nursing Facility | Attending: Family Medicine | Admitting: Family Medicine

## 2018-10-10 DIAGNOSIS — Z95810 Presence of automatic (implantable) cardiac defibrillator: Secondary | ICD-10-CM

## 2018-10-10 DIAGNOSIS — I471 Supraventricular tachycardia: Secondary | ICD-10-CM | POA: Diagnosis not present

## 2018-10-10 DIAGNOSIS — I5022 Chronic systolic (congestive) heart failure: Secondary | ICD-10-CM | POA: Diagnosis present

## 2018-10-10 DIAGNOSIS — E785 Hyperlipidemia, unspecified: Secondary | ICD-10-CM | POA: Diagnosis present

## 2018-10-10 DIAGNOSIS — I429 Cardiomyopathy, unspecified: Secondary | ICD-10-CM | POA: Diagnosis present

## 2018-10-10 DIAGNOSIS — J90 Pleural effusion, not elsewhere classified: Secondary | ICD-10-CM

## 2018-10-10 DIAGNOSIS — J939 Pneumothorax, unspecified: Secondary | ICD-10-CM | POA: Diagnosis not present

## 2018-10-10 DIAGNOSIS — Z20828 Contact with and (suspected) exposure to other viral communicable diseases: Secondary | ICD-10-CM | POA: Diagnosis present

## 2018-10-10 DIAGNOSIS — F0390 Unspecified dementia without behavioral disturbance: Secondary | ICD-10-CM | POA: Diagnosis present

## 2018-10-10 DIAGNOSIS — S72009A Fracture of unspecified part of neck of unspecified femur, initial encounter for closed fracture: Secondary | ICD-10-CM

## 2018-10-10 DIAGNOSIS — D509 Iron deficiency anemia, unspecified: Secondary | ICD-10-CM | POA: Diagnosis present

## 2018-10-10 DIAGNOSIS — I10 Essential (primary) hypertension: Secondary | ICD-10-CM | POA: Diagnosis not present

## 2018-10-10 DIAGNOSIS — R296 Repeated falls: Secondary | ICD-10-CM | POA: Diagnosis present

## 2018-10-10 DIAGNOSIS — I5032 Chronic diastolic (congestive) heart failure: Secondary | ICD-10-CM | POA: Diagnosis not present

## 2018-10-10 DIAGNOSIS — E43 Unspecified severe protein-calorie malnutrition: Secondary | ICD-10-CM

## 2018-10-10 DIAGNOSIS — M1611 Unilateral primary osteoarthritis, right hip: Secondary | ICD-10-CM | POA: Diagnosis present

## 2018-10-10 DIAGNOSIS — M810 Age-related osteoporosis without current pathological fracture: Secondary | ICD-10-CM | POA: Diagnosis present

## 2018-10-10 DIAGNOSIS — S72141A Displaced intertrochanteric fracture of right femur, initial encounter for closed fracture: Principal | ICD-10-CM | POA: Diagnosis present

## 2018-10-10 DIAGNOSIS — L89152 Pressure ulcer of sacral region, stage 2: Secondary | ICD-10-CM | POA: Diagnosis present

## 2018-10-10 DIAGNOSIS — D539 Nutritional anemia, unspecified: Secondary | ICD-10-CM | POA: Diagnosis present

## 2018-10-10 DIAGNOSIS — R0902 Hypoxemia: Secondary | ICD-10-CM

## 2018-10-10 DIAGNOSIS — S72001A Fracture of unspecified part of neck of right femur, initial encounter for closed fracture: Secondary | ICD-10-CM | POA: Diagnosis not present

## 2018-10-10 DIAGNOSIS — E739 Lactose intolerance, unspecified: Secondary | ICD-10-CM | POA: Diagnosis present

## 2018-10-10 DIAGNOSIS — L8990 Pressure ulcer of unspecified site, unspecified stage: Secondary | ICD-10-CM

## 2018-10-10 DIAGNOSIS — J9811 Atelectasis: Secondary | ICD-10-CM | POA: Diagnosis not present

## 2018-10-10 DIAGNOSIS — Z9181 History of falling: Secondary | ICD-10-CM

## 2018-10-10 DIAGNOSIS — Z79899 Other long term (current) drug therapy: Secondary | ICD-10-CM

## 2018-10-10 DIAGNOSIS — Z7902 Long term (current) use of antithrombotics/antiplatelets: Secondary | ICD-10-CM

## 2018-10-10 DIAGNOSIS — Z7982 Long term (current) use of aspirin: Secondary | ICD-10-CM

## 2018-10-10 DIAGNOSIS — M25551 Pain in right hip: Secondary | ICD-10-CM | POA: Diagnosis not present

## 2018-10-10 DIAGNOSIS — D62 Acute posthemorrhagic anemia: Secondary | ICD-10-CM | POA: Diagnosis not present

## 2018-10-10 DIAGNOSIS — D649 Anemia, unspecified: Secondary | ICD-10-CM | POA: Diagnosis not present

## 2018-10-10 DIAGNOSIS — Z7189 Other specified counseling: Secondary | ICD-10-CM

## 2018-10-10 DIAGNOSIS — E559 Vitamin D deficiency, unspecified: Secondary | ICD-10-CM | POA: Diagnosis present

## 2018-10-10 DIAGNOSIS — J449 Chronic obstructive pulmonary disease, unspecified: Secondary | ICD-10-CM | POA: Diagnosis present

## 2018-10-10 DIAGNOSIS — Z419 Encounter for procedure for purposes other than remedying health state, unspecified: Secondary | ICD-10-CM

## 2018-10-10 DIAGNOSIS — J95811 Postprocedural pneumothorax: Secondary | ICD-10-CM | POA: Diagnosis not present

## 2018-10-10 DIAGNOSIS — Z9689 Presence of other specified functional implants: Secondary | ICD-10-CM

## 2018-10-10 DIAGNOSIS — W1830XA Fall on same level, unspecified, initial encounter: Secondary | ICD-10-CM | POA: Diagnosis present

## 2018-10-10 DIAGNOSIS — R64 Cachexia: Secondary | ICD-10-CM | POA: Diagnosis present

## 2018-10-10 DIAGNOSIS — I110 Hypertensive heart disease with heart failure: Secondary | ICD-10-CM | POA: Diagnosis present

## 2018-10-10 DIAGNOSIS — R0602 Shortness of breath: Secondary | ICD-10-CM

## 2018-10-10 DIAGNOSIS — Y838 Other surgical procedures as the cause of abnormal reaction of the patient, or of later complication, without mention of misadventure at the time of the procedure: Secondary | ICD-10-CM | POA: Diagnosis not present

## 2018-10-10 DIAGNOSIS — N400 Enlarged prostate without lower urinary tract symptoms: Secondary | ICD-10-CM | POA: Diagnosis present

## 2018-10-10 DIAGNOSIS — S72001D Fracture of unspecified part of neck of right femur, subsequent encounter for closed fracture with routine healing: Secondary | ICD-10-CM | POA: Diagnosis not present

## 2018-10-10 DIAGNOSIS — Z6820 Body mass index (BMI) 20.0-20.9, adult: Secondary | ICD-10-CM | POA: Diagnosis not present

## 2018-10-10 DIAGNOSIS — I2510 Atherosclerotic heart disease of native coronary artery without angina pectoris: Secondary | ICD-10-CM | POA: Diagnosis present

## 2018-10-10 DIAGNOSIS — M8000XA Age-related osteoporosis with current pathological fracture, unspecified site, initial encounter for fracture: Secondary | ICD-10-CM | POA: Diagnosis not present

## 2018-10-10 DIAGNOSIS — Z515 Encounter for palliative care: Secondary | ICD-10-CM

## 2018-10-10 LAB — URINALYSIS, ROUTINE W REFLEX MICROSCOPIC
Bilirubin Urine: NEGATIVE
Glucose, UA: NEGATIVE mg/dL
Hgb urine dipstick: NEGATIVE
Ketones, ur: NEGATIVE mg/dL
Leukocytes,Ua: NEGATIVE
Nitrite: NEGATIVE
Protein, ur: NEGATIVE mg/dL
Specific Gravity, Urine: 1.018 (ref 1.005–1.030)
pH: 7 (ref 5.0–8.0)

## 2018-10-10 LAB — COMPREHENSIVE METABOLIC PANEL
ALT: 19 U/L (ref 0–44)
AST: 18 U/L (ref 15–41)
Albumin: 2.6 g/dL — ABNORMAL LOW (ref 3.5–5.0)
Alkaline Phosphatase: 126 U/L (ref 38–126)
Anion gap: 9 (ref 5–15)
BUN: 17 mg/dL (ref 8–23)
CO2: 25 mmol/L (ref 22–32)
Calcium: 8.6 mg/dL — ABNORMAL LOW (ref 8.9–10.3)
Chloride: 107 mmol/L (ref 98–111)
Creatinine, Ser: 0.94 mg/dL (ref 0.61–1.24)
GFR calc Af Amer: >60 mL/min (ref >60)
GFR calc non Af Amer: >60 mL/min (ref >60)
Glucose, Bld: 136 mg/dL — ABNORMAL HIGH (ref 70–99)
Potassium: 4.2 mmol/L (ref 3.5–5.1)
Sodium: 141 mmol/L (ref 135–145)
Total Bilirubin: 0.7 mg/dL (ref 0.3–1.2)
Total Protein: 5.4 g/dL — ABNORMAL LOW (ref 6.5–8.1)

## 2018-10-10 LAB — CBC WITH DIFFERENTIAL/PLATELET
Abs Immature Granulocytes: 0.16 10*3/uL — ABNORMAL HIGH (ref 0.00–0.07)
Basophils Absolute: 0.1 10*3/uL (ref 0.0–0.1)
Basophils Relative: 0 %
Eosinophils Absolute: 0 10*3/uL (ref 0.0–0.5)
Eosinophils Relative: 0 %
HCT: 31.9 % — ABNORMAL LOW (ref 39.0–52.0)
Hemoglobin: 9.9 g/dL — ABNORMAL LOW (ref 13.0–17.0)
Immature Granulocytes: 1 %
Lymphocytes Relative: 19 %
Lymphs Abs: 4 10*3/uL (ref 0.7–4.0)
MCH: 32 pg (ref 26.0–34.0)
MCHC: 31 g/dL (ref 30.0–36.0)
MCV: 103.2 fL — ABNORMAL HIGH (ref 80.0–100.0)
Monocytes Absolute: 2.1 10*3/uL — ABNORMAL HIGH (ref 0.1–1.0)
Monocytes Relative: 10 %
Neutro Abs: 14.3 10*3/uL — ABNORMAL HIGH (ref 1.7–7.7)
Neutrophils Relative %: 70 %
Platelets: 223 10*3/uL (ref 150–400)
RBC: 3.09 MIL/uL — ABNORMAL LOW (ref 4.22–5.81)
RDW: 15.6 % — ABNORMAL HIGH (ref 11.5–15.5)
WBC: 20.6 10*3/uL — ABNORMAL HIGH (ref 4.0–10.5)
nRBC: 0 % (ref 0.0–0.2)

## 2018-10-10 LAB — BRAIN NATRIURETIC PEPTIDE: B Natriuretic Peptide: 113 pg/mL — ABNORMAL HIGH (ref 0.0–100.0)

## 2018-10-10 LAB — APTT: aPTT: 36 seconds (ref 24–36)

## 2018-10-10 LAB — TYPE AND SCREEN
ABO/RH(D): A POS
Antibody Screen: NEGATIVE

## 2018-10-10 LAB — PROTIME-INR
INR: 1.2 (ref 0.8–1.2)
Prothrombin Time: 14.7 seconds (ref 11.4–15.2)

## 2018-10-10 LAB — TROPONIN I: Troponin I: <0.03 ng/mL (ref <0.03)

## 2018-10-10 LAB — ABO/RH: ABO/RH(D): A POS

## 2018-10-10 LAB — SARS CORONAVIRUS 2 BY RT PCR (HOSPITAL ORDER, PERFORMED IN ~~LOC~~ HOSPITAL LAB): SARS Coronavirus 2: NEGATIVE

## 2018-10-10 MED ORDER — ACETAMINOPHEN 325 MG PO TABS: 650.0000 mg | ORAL_TABLET | Freq: Four times a day (QID) | ORAL | Status: DC | PRN

## 2018-10-10 MED ORDER — ACETAMINOPHEN 325 MG PO TABS
650.0000 mg | ORAL_TABLET | Freq: Four times a day (QID) | ORAL | Status: DC | PRN
  Administered 2018-10-10: 650 mg via ORAL
  Filled 2018-10-10: qty 2

## 2018-10-10 MED ORDER — ACETAMINOPHEN 650 MG RE SUPP: 650.0000 mg | Freq: Four times a day (QID) | RECTAL | Status: DC | PRN

## 2018-10-10 MED ORDER — CARVEDILOL 12.5 MG PO TABS
12.5000 mg | ORAL_TABLET | Freq: Two times a day (BID) | ORAL | Status: DC
  Administered 2018-10-10 – 2018-10-21 (×21): 12.5 mg via ORAL
  Filled 2018-10-10 (×21): qty 1

## 2018-10-10 MED ORDER — MORPHINE SULFATE (PF) 2 MG/ML IV SOLN
1.0000 mg | INTRAVENOUS | Status: DC | PRN
  Administered 2018-10-10 – 2018-10-11 (×2): 1 mg via INTRAVENOUS
  Filled 2018-10-10 (×2): qty 1

## 2018-10-10 MED ORDER — SODIUM CHLORIDE 0.9 % IV SOLN: INTRAVENOUS | Status: DC

## 2018-10-10 MED ORDER — MORPHINE SULFATE (PF) 2 MG/ML IV SOLN: 1.0000 mg | INTRAVENOUS | Status: DC | PRN

## 2018-10-10 NOTE — ED Notes (Signed)
ED TO INPATIENT HANDOFF REPORT  ED Nurse Name and Phone #: Caprice Kluver [phone]  S Name/Age/Gender Maurice Mckee 85 y.o. male Room/Bed: 019C/019C  Code Status   Code Status: Not on file  Home/SNF/Other Home Patient oriented to: self Is this baseline? Yes   Triage Complete: Triage complete  Chief Complaint Hip Fracture  Triage Note Pt here from [facility]. EMS called for right hip fracture confirmed by xray. Pt fell from wheelchair 2 days ago. At [facility] for rehab for recent left hip fracture. Upon arrival pt found to have O2 sats of 79%. Placed on nonrebreather by EMS.     Allergies Allergies  Allergen Reactions  . Lactose Intolerance (Gi) Other (See Comments)    Unknown reaction    Level of Care/Admitting Diagnosis ED Disposition    ED Disposition Condition Comment   Admit  Hospital Area: [place] [[id number]]  Level of Care: Med-Surg [16]  Covid Evaluation: N/A  Diagnosis: Closed right hip fracture [facility]) [[id number]]  Admitting Physician: Martyn Malay [[id number]]  Attending Physician: Martyn Malay [[id number]]  Estimated length of stay: past midnight tomorrow  Certification:: I certify this patient will need inpatient services for at least 2 midnights  PT Class (Do Not Modify): Inpatient [101]  PT Acc Code (Do Not Modify): Private [1]       B Medical/Surgery History Past Medical History:  Diagnosis Date  . Arthritis   . Cancer ([place])   . Cardiomyopathy ([place])   . CHF (congestive heart failure) ([place])   . COPD (chronic obstructive pulmonary disease) ([place])   . Coronary artery disease   . Dementia ([place])   . Hyperlipidemia   . Hypertension    Past Surgical History:  Procedure Laterality Date  . CARDIAC PACEMAKER PLACEMENT       A IV Location/Drains/Wounds Patient Lines/Drains/Airways Status   Active Line/Drains/Airways    Name:   Placement date:   Placement time:   Site:   Days:   Peripheral IV 10/10/18 Right Forearm    10/10/18    1443    Forearm   less than 1          Intake/Output Last 24 hours No intake or output data in the 24 hours ending 10/10/18 1855  Labs/Imaging Results for orders placed or performed during the hospital encounter of 10/10/18 (from the past 48 hour(s))  SARS Coronavirus 2 (CEPHEID- Performed in [place] hospital lab), Hosp Order     Status: None   Collection Time: 10/10/18 12:46 PM  Result Value Ref Range   SARS Coronavirus 2 NEGATIVE NEGATIVE    Comment: (NOTE) If result is NEGATIVE SARS-CoV-2 target nucleic acids are NOT DETECTED. The SARS-CoV-2 RNA is generally detectable in upper and lower  respiratory specimens during the acute phase of infection. The lowest  concentration of SARS-CoV-2 viral copies this assay can detect is 250  copies / mL. A negative result does not preclude SARS-CoV-2 infection  and should not be used as the sole basis for treatment or other  patient management decisions.  A negative result may occur with  improper specimen collection / handling, submission of specimen other  than nasopharyngeal swab, presence of viral mutation(s) within the  areas targeted by this assay, and inadequate number of viral copies  (<250 copies / mL). A negative result must be combined with clinical  observations, patient history, and epidemiological information. If result is POSITIVE SARS-CoV-2 target nucleic acids are DETECTED. The SARS-CoV-2 RNA is generally detectable  in upper and lower  respiratory specimens dur ing the acute phase of infection.  Positive  results are indicative of active infection with SARS-CoV-2.  Clinical  correlation with patient history and other diagnostic information is  necessary to determine patient infection status.  Positive results do  not rule out bacterial infection or co-infection with other viruses. If result is PRESUMPTIVE POSTIVE SARS-CoV-2 nucleic acids MAY BE PRESENT.   A presumptive positive result was obtained on the  submitted specimen  and confirmed on repeat testing.  While 2019 novel coronavirus  (SARS-CoV-2) nucleic acids may be present in the submitted sample  additional confirmatory testing may be necessary for epidemiological  and / or clinical management purposes  to differentiate between  SARS-CoV-2 and other Sarbecovirus currently known to infect humans.  If clinically indicated additional testing with an alternate test  methodology [phone]) is advised. The SARS-CoV-2 RNA is generally  detectable in upper and lower respiratory sp ecimens during the acute  phase of infection. The expected result is Negative. Fact Sheet for Patients:  StrictlyIdeas.no Fact Sheet for Healthcare Providers: BankingDealers.co.za This test is not yet approved or cleared by the [place] FDA and has been authorized for detection and/or diagnosis of SARS-CoV-2 by FDA under an Emergency Use Authorization (EUA).  This EUA will remain in effect (meaning this test can be used) for the duration of the COVID-19 declaration under Section 564(b)(1) of the Act, 21 U.S.C. section 360bbb-3(b)(1), unless the authorization is terminated or revoked sooner. Performed at [facility], [place] [address], [place], [place] [address]   Comprehensive metabolic panel     Status: Abnormal   Collection Time: 10/10/18  1:00 PM  Result Value Ref Range   Sodium 141 135 - 145 mmol/L   Potassium 4.2 3.5 - 5.1 mmol/L   Chloride 107 98 - 111 mmol/L   CO2 25 22 - 32 mmol/L   Glucose, Bld 136 (H) 70 - 99 mg/dL   BUN 17 8 - 23 mg/dL   Creatinine, Ser 0.94 0.61 - 1.24 mg/dL   Calcium 8.6 (L) 8.9 - 10.3 mg/dL   Total Protein 5.4 (L) 6.5 - 8.1 g/dL   Albumin 2.6 (L) 3.5 - 5.0 g/dL   AST 18 15 - 41 U/L   ALT 19 0 - 44 U/L   Alkaline Phosphatase 126 38 - 126 U/L   Total Bilirubin 0.7 0.3 - 1.2 mg/dL   GFR calc non Af Amer >60 >60 mL/min   GFR calc Af Amer >60 >60 mL/min   Anion gap 9 5  - 15    Comment: Performed at [facility] [address], [place], [place] [address]  CBC WITH DIFFERENTIAL     Status: Abnormal   Collection Time: 10/10/18  1:00 PM  Result Value Ref Range   WBC 20.6 (H) 4.0 - 10.5 K/uL   RBC 3.09 (L) 4.22 - 5.81 MIL/uL   Hemoglobin 9.9 (L) 13.0 - 17.0 g/dL   HCT 31.9 (L) 39.0 - 52.0 %   MCV 103.2 (H) 80.0 - 100.0 fL   MCH 32.0 26.0 - 34.0 pg   MCHC 31.0 30.0 - 36.0 g/dL   RDW 15.6 (H) 11.5 - 15.5 %   Platelets 223 150 - 400 K/uL   nRBC 0.0 0.0 - 0.2 %   Neutrophils Relative % 70 %   Neutro Abs 14.3 (H) 1.7 - 7.7 K/uL   Lymphocytes Relative 19 %   Lymphs Abs 4.0 0.7 - 4.0  K/uL   Monocytes Relative 10 %   Monocytes Absolute 2.1 (H) 0.1 - 1.0 K/uL   Eosinophils Relative 0 %   Eosinophils Absolute 0.0 0.0 - 0.5 K/uL   Basophils Relative 0 %   Basophils Absolute 0.1 0.0 - 0.1 K/uL   Immature Granulocytes 1 %   Abs Immature Granulocytes 0.16 (H) 0.00 - 0.07 K/uL    Comment: Performed at [facility] [address] [place], [place] [address]  APTT     Status: None   Collection Time: 10/10/18  1:00 PM  Result Value Ref Range   aPTT 36 24 - 36 seconds    Comment: Performed at [facility] [address], [place], [place] [address]  Protime-INR     Status: None   Collection Time: 10/10/18  1:00 PM  Result Value Ref Range   Prothrombin Time 14.7 11.4 - 15.2 seconds   INR 1.2 0.8 - 1.2    Comment: (NOTE) INR goal varies based on device and disease states. Performed at [facility], [place] [address], [place], [place] [address]   Type and screen [place]     Status: None   Collection Time: 10/10/18  1:00 PM  Result Value Ref Range   ABO/RH(D) A POS    Antibody Screen NEG    Sample Expiration      10/13/2018,2359 Performed at [facility], [place] [address], [place], [place] [address]   ABO/Rh     Status: None   Collection Time: 10/10/18  1:00 PM  Result Value Ref Range   ABO/RH(D)      A  POS Performed at [facility] [address], [place], [place] [address]   Urinalysis, Routine w reflex microscopic     Status: None   Collection Time: 10/10/18  2:15 PM  Result Value Ref Range   Color, Urine YELLOW YELLOW   APPearance CLEAR CLEAR   Specific Gravity, Urine 1.018 1.005 - 1.030   pH 7.0 5.0 - 8.0   Glucose, UA NEGATIVE NEGATIVE mg/dL   Hgb urine dipstick NEGATIVE NEGATIVE   Bilirubin Urine NEGATIVE NEGATIVE   Ketones, ur NEGATIVE NEGATIVE mg/dL   Protein, ur NEGATIVE NEGATIVE mg/dL   Nitrite NEGATIVE NEGATIVE   Leukocytes,Ua NEGATIVE NEGATIVE    Comment: Performed at [facility] [address], [place], [place] [address]  Brain natriuretic peptide     Status: Abnormal   Collection Time: 10/10/18  3:59 PM  Result Value Ref Range   B Natriuretic Peptide 113.0 (H) 0.0 - 100.0 pg/mL    Comment: Performed at [facility] [address], [place], [place] [address]  Troponin I - ONCE - STAT     Status: None   Collection Time: 10/10/18  3:59 PM  Result Value Ref Range   Troponin I <0.03 <0.03 ng/mL    Comment: Performed at [facility], [place] [address], [place],  [address]   Dg Chest 1 View  Result Date: 10/10/2018 CLINICAL DATA:  Right hip fracture. EXAM: CHEST  1 VIEW COMPARISON:  Chest x-ray dated September 04, 2018. FINDINGS: Unchanged left chest wall pacemaker. Stable cardiomediastinal silhouette. Normal pulmonary vascularity. No focal consolidation, pleural effusion, or pneumothorax. No acute osseous abnormality. IMPRESSION: No active disease. Electronically Signed   By: Titus Dubin M.D.   On: 10/10/2018 16:16   Ct Head Wo Contrast  Result Date: 10/10/2018 CLINICAL DATA:  Altered level  of consciousness. EXAM: CT HEAD WITHOUT CONTRAST TECHNIQUE: Contiguous axial images were obtained from the base of the skull through the vertex without intravenous contrast. COMPARISON:  CT scan of August 13, 2018. FINDINGS: Brain: Mild diffuse cortical  atrophy is noted. Mild chronic ischemic white matter disease is noted. No mass effect or midline shift is noted. Ventricular size is within normal limits. There is no evidence of mass lesion, hemorrhage or acute infarction. Vascular: No hyperdense vessel or unexpected calcification. Skull: Normal. Negative for fracture or focal lesion. Sinuses/Orbits: Complete opacification of left maxillary sinus is again noted. Other: None. IMPRESSION: Mild diffuse cortical atrophy. Mild chronic ischemic white matter disease. No acute intracranial abnormality seen. Electronically Signed   By: Marijo Conception M.D.   On: 10/10/2018 13:55   Dg Hip Unilat With Pelvis 2-3 Views Left  Result Date: 10/10/2018 CLINICAL DATA:  Right hip fracture. [place] out of a wheelchair 2 days ago. EXAM: DG HIP (WITH OR WITHOUT PELVIS) 2-3V LEFT COMPARISON:  Pelvic x-ray dated September 05, 2018. FINDINGS: Acute comminuted, displaced, and angulated right intertrochanteric femur fracture. No dislocation. Unchanged left hip hemiarthroplasty. No evidence of hardware failure or loosening. Old fracture of the right inferior pubic ramus again noted. The pubic symphysis and sacroiliac joints are intact. Osteopenia. Soft tissues are unremarkable. IMPRESSION: 1. Acute right intertrochanteric femur fracture. 2. Unchanged left hip hemiarthroplasty without hardware complication. Electronically Signed   By: Titus Dubin M.D.   On: 10/10/2018 16:15   Dg Hip Unilat With Pelvis 2-3 Views Right  Result Date: 10/10/2018 CLINICAL DATA:  Fall, hip fracture EXAM: DG HIP (WITH OR WITHOUT PELVIS) 2-3V RIGHT COMPARISON:  10/10/2018 FINDINGS: There is an acute displaced and angulated right hip intertrochanteric fracture. Bones are osteopenic. No subluxation or dislocation. Peripheral atherosclerosis noted. Remote right inferior ramus fracture with healed deformity. IMPRESSION: Acute displaced and angulated right hip intertrochanteric fracture. Electronically Signed   By:  Jerilynn Mages.  Shick M.D.   On: 10/10/2018 17:17    Pending Labs Unresulted Labs (From admission, onward)    Start     Ordered   10/11/18 0500  Iron and TIBC  Tomorrow morning,   R     10/10/18 1829   10/11/18 0500  Ferritin  Tomorrow morning,   R     10/10/18 1829   10/11/18 0500  TSH  Tomorrow morning,   R     10/10/18 1829   10/11/18 0500  Folate RBC  Tomorrow morning,   R     10/10/18 1829   10/11/18 0500  Vitamin B12  Tomorrow morning,   R     10/10/18 1829   10/10/18 1244  Urine culture  ONCE - STAT,   STAT     10/10/18 1246   Signed and Held  Comprehensive metabolic panel  Tomorrow morning,   R     Signed and Held   Signed and Held  CBC WITH DIFFERENTIAL  Tomorrow morning,   R     Signed and Held   Signed and Held  Protime-INR  Tomorrow morning,   R     Signed and Held          Vitals/Pain Today's Vitals   10/10/18 1430 10/10/18 1445 10/10/18 1500 10/10/18 1619  BP: (!) 150/97 (!) 165/127 (!) 167/79 (!) 165/95  Pulse:   (!) 114 100  Resp: 16 15 20 16   Temp:      TempSrc:      SpO2:   96% 93%  Weight:      Height:        Isolation Precautions Droplet and Contact precautions  Medications Medications  morphine 2 MG/ML injection 1 mg (has no administration in time range)  0.9 %  sodium chloride infusion (has no administration in time range)    Mobility walks with device High fall risk   Focused Assessments Vascular   R Recommendations: See Admitting Provider Note  Report given to:   Additional Notes:

## 2018-10-10 NOTE — Progress Notes (Addendum)
I have discussed this patient's care with Dr. Tyrone Nine in the emergency department, and with [name], the son of Mr. Malburg.  At this time he would like to consider the options of surgery versus nonsurgical treatment for the hip fracture.  I well touch base with the family in the morning to see what their desire is.  Patient may have a diet at this time   Please hold any dvt ppx until after final decision made regarding surgery or not.   Nicholes Stairs

## 2018-10-10 NOTE — ED Notes (Signed)
Called radiology to take pt.  They are awaiting covid test results.  MD feels pt should go. Radiology to call MD.

## 2018-10-10 NOTE — Progress Notes (Signed)
Family Medicine Teaching Service Daily Progress Note Intern Pager: [phone]  Patient name: Maurice Mckee Medical record number: [id number] Date of birth: 1925-10-07 Age: 85 y.o. Gender: male  Primary Care Provider: Patient, No Pcp Per Consultants: further Code Status: Full   Pt Overview and Major Events to Date:  5/22 admitted for R hip fracture  Assessment and Plan: Maurice Mckee is a 85 y.o. male presenting from SNF after right hip fracture. PMH is significant for left hip fracture s/p repair on 09/04/18 (SNF at [facility]), recent h/o PNA in April, severe dementia, CHF (EF 45-50% on 04/13/18) , HTN,  HLD, and vitamin D deficiency  Right femoral fracture  H/o Multiple Falls: S/p fall at SNF and found to have acute displaced and angulated right hip intertrochanteric fracture on xray. Family conflicted regarding proceeding with surgery vs nonsurgical management. Per orthopedics, high likelihood of patient becoming bedbound without surgery which has associated morbidity. Patient does already have a stage 2 sacral ulcer. Discussed this recommendation with son Maurice Mckee overnight, attempted to reassure that no signs of infection or PNA which appeared to be family's concern given patient had developed PNA at the time of his recent L hip surgery in April.  - Ortho recommending intramedullary nail of R hip, plans to discuss with family again this morning. - Morphine 1mg  q4 PRN, tylenol PRN - bed rest for nonweightbearing  - continuous cardiac monitoring - consult PT/OT if patient proceeds with surgery - consult palliative care to assist with [facility] - EKG pending for risk stratification preop - consult wound for sacral ulcer  Leukocytosis: WBC 20.6 on admission, now 17.8. No signs of infection, patient afebrile and CXR clear. - continue to monitor closely for signs of infection  - if develops dyspnea or fever will obtain repeat CXR, blood culture, urine culture, UA and start antibiotics -  monitor WBC  Anemia: Hgb 9.0, MCV 100. Baseline 12-13. Home meds: iron supplement. Iron 16, sat 9, ferritin 393, vitb12 243 - restart home iron supplement when no longer NPO - monitor CBC  HFrEF (45-50%)  Pacemaker: Not in acute exacerbation though BNP slightly elevated on admit to 113 - continue home Coreg 12.5mg  BID - hold ASA, Plavix, Lasix.  - EKG pending - holding home torsemide as appears dry on exam  Advanced Dementia: Home meds: Aricept and Namenda - hold home meds, may stop during this hospital stay given patient likely not benefiting from these medications   Severe Protein calorie malnutrition: Albumin 2.6. Cachetic appearing on exam.  - nutrition consulted  FEN/GI: NPO in case of surgery Prophylaxis: SCDs  Disposition: awaiting family discussion regardign surgery vs non-operative management  Subjective:  Per RN patient did well overnight. Not in much pain. Did have run of asymptomatic SVT overnight.  Objective: Temp:  [98.4 F (36.9 C)-99.2 F (37.3 C)] 99 F (37.2 C) (05/23 0454) Pulse Rate:  [82-114] 88 (05/23 0454) Resp:  [11-20] 15 (05/23 0454) BP: (116-190)/(70-127) 151/70 (05/23 0454) SpO2:  [89 %-98 %] 95 % (05/23 0454) Weight:  [63.6 kg-65.8 kg] 63.6 kg (05/22 2232) Physical Exam: General: laying in bed comfortably, in NAD, frail appearing Cardiovascular: RRR, no murmurs. No JVD.  Respiratory: CTAB, nasal cannula above head, NWOB Abdomen: soft, nontender, nondistended, + bowel sounds Extremities: WWP, no LE edema Skin: stage 2 stage sacral ulcer  Laboratory: Recent Labs  Lab 10/10/18 1300 10/11/18 0327  WBC 20.6* 17.8*  HGB 9.9* 9.0*  HCT 31.9* 28.0*  PLT 223 208   Recent Labs  Lab 10/10/18 1300 10/11/18 0327  NA 141 138  K 4.2 4.1  CL 107 107  CO2 25 25  BUN 17 17  CREATININE 0.94 0.80  CALCIUM 8.6* 8.2*  PROT 5.4* 4.7*  BILITOT 0.7 0.8  ALKPHOS 126 116  ALT 19 18  AST 18 15  GLUCOSE 136* 119*    TSH 3.805  EKG  NSR  Iron/TIBC/Ferritin/ %Sat    Component Value Date/Time   IRON 16 (L) 10/11/2018 0327   TIBC 176 (L) 10/11/2018 0327   FERRITIN 393 (H) 10/11/2018 0327   IRONPCTSAT 9 (L) 10/11/2018 0327     Imaging/Diagnostic Tests: Dg Chest 1 View  Result Date: 10/10/2018 CLINICAL DATA:  Right hip fracture. EXAM: CHEST  1 VIEW COMPARISON:  Chest x-ray dated September 04, 2018. FINDINGS: Unchanged left chest wall pacemaker. Stable cardiomediastinal silhouette. Normal pulmonary vascularity. No focal consolidation, pleural effusion, or pneumothorax. No acute osseous abnormality. IMPRESSION: No active disease. Electronically Signed   By: Titus Dubin M.D.   On: 10/10/2018 16:16   Ct Head Wo Contrast  Result Date: 10/10/2018 CLINICAL DATA:  Altered level of consciousness. EXAM: CT HEAD WITHOUT CONTRAST TECHNIQUE: Contiguous axial images were obtained from the base of the skull through the vertex without intravenous contrast. COMPARISON:  CT scan of August 13, 2018. FINDINGS: Brain: Mild diffuse cortical atrophy is noted. Mild chronic ischemic white matter disease is noted. No mass effect or midline shift is noted. Ventricular size is within normal limits. There is no evidence of mass lesion, hemorrhage or acute infarction. Vascular: No hyperdense vessel or unexpected calcification. Skull: Normal. Negative for fracture or focal lesion. Sinuses/Orbits: Complete opacification of left maxillary sinus is again noted. Other: None. IMPRESSION: Mild diffuse cortical atrophy. Mild chronic ischemic white matter disease. No acute intracranial abnormality seen. Electronically Signed   By: Marijo Conception M.D.   On: 10/10/2018 13:55   Dg Hip Unilat With Pelvis 2-3 Views Left  Result Date: 10/10/2018 CLINICAL DATA:  Right hip fracture. [place] out of a wheelchair 2 days ago. EXAM: DG HIP (WITH OR WITHOUT PELVIS) 2-3V LEFT COMPARISON:  Pelvic x-ray dated September 05, 2018. FINDINGS: Acute comminuted, displaced, and angulated right  intertrochanteric femur fracture. No dislocation. Unchanged left hip hemiarthroplasty. No evidence of hardware failure or loosening. Old fracture of the right inferior pubic ramus again noted. The pubic symphysis and sacroiliac joints are intact. Osteopenia. Soft tissues are unremarkable. IMPRESSION: 1. Acute right intertrochanteric femur fracture. 2. Unchanged left hip hemiarthroplasty without hardware complication. Electronically Signed   By: Titus Dubin M.D.   On: 10/10/2018 16:15   Dg Hip Unilat With Pelvis 2-3 Views Right  Result Date: 10/10/2018 CLINICAL DATA:  Fall, hip fracture EXAM: DG HIP (WITH OR WITHOUT PELVIS) 2-3V RIGHT COMPARISON:  10/10/2018 FINDINGS: There is an acute displaced and angulated right hip intertrochanteric fracture. Bones are osteopenic. No subluxation or dislocation. Peripheral atherosclerosis noted. Remote right inferior ramus fracture with healed deformity. IMPRESSION: Acute displaced and angulated right hip intertrochanteric fracture. Electronically Signed   By: Jerilynn Mages.  Shick M.D.   On: 10/10/2018 17:17    Bufford Lope, DO 10/11/2018, 6:43 AM PGY-3, [facility] Intern pager: [phone], text pages welcome

## 2018-10-10 NOTE — ED Provider Notes (Signed)
I received the patient in signout from Dr. Jeanell Sparrow.  Briefly the patient is a 85-year-old male with a chief complaint of right hip pain after a fall.  He recently had his left hip repaired surgically less than a month ago and is in rehab.  Patient was found to have a new right intertrochanteric fracture.  Was sent here for evaluation.  Found to be newly hypoxic, plan is for work-up and that admission.  Patient had minimal ambulation prior to the initial hip repair initial discussions with the family showed some hesitation to have the second hip fixed.  I had a telephone discussion with son who is still working over the details with the rest of the family.  Plain films viewed by me with intertrochanteric right hip fracture.  I discussed the case with Victorino December, orthopedics he will discussed the case with the family.  I will discuss with the hospitalist for admission.  Plan to make the patient n.p.o. at midnight for possible intervention tomorrow if the family agrees to surgery   Deno Etienne, DO 10/10/18 2009

## 2018-10-10 NOTE — ED Notes (Signed)
Communicated with pt son. Told him that pt was in imaging and had orthopedic consult.

## 2018-10-10 NOTE — H&P (Addendum)
[facility] Admission History and Physical Service Pager: [phone]  Patient name: Maurice Mckee Medical record number: [id number] Date of birth: September 09, 1925 Age: 85 y.o. Gender: male  Primary Care Provider: Patient, No Pcp Per Consultants: Ortho Code Status: Full (to be further discussed after further discussion with family) Emergency Contact: [name] [phone]   Chief Complaint: right hip fracture  Assessment and Plan: Toshiyuki Fredell is a 85 y.o. male presenting from SNF after right hip fracture. PMH is significant for left hip fracture s/p repair on 09/04/18 (SNF at [facility]), recent h/o PNA in April, severe dementia, CHF (EF 45-50% on 04/13/18) , HTN,  HLD, and vitamin D deficiency  Right femoral fracture  H/o Multiple Falls: S/p fall 2 days ago at [facility]. Hip xray consistent with acute right intertrochanteric femur fracture. Patient with recent history of left hip fracture on 09/04/18 s/p hemiarthroplasty on 4/17. Hip x-ray significant for confirmeddisplaced and angulated right intertrochanteric femur fracture with unchanged left hip hemiarhtroplasty without hardware. CT head negative for acute bleed. PT/INR WNL. Hgb 9.9 (Baseline 12-13, most recent 11.6). On exam, some tenderness to palpation but no signs of open fracture, hematoma, or ecchymosis. Neurovascularly intact. Ortho consulted in ED. Family to decide if they want to pursue surgical treatment or opt for non-operative treatment given patient's dementia and chronic medical problems. - Admit to med-tele, attending Dr. Owens Shark - Per ortho, recommend intramedullary nail of R hip, awaiting family discussion between surgery vs non-operative management, plan to check in with family in AM - Morphine 1mg  q4 PRN, tylenol PRN - bed rest - Heart heathy diet (okay per ortho), NPO at midnight - NS @ 67mL/hr for 8 hours  - continuous cardiac monitoring - consult PT/OT pending surgery - AM CBC, BMP - AM  EKG - consult palliative care to assist with GoC  Leukocytosis: WBC 20.6 on admission. History and Exam very limited given severe dementia and frailty of patient. Abdomen nontender to palpation. CXR without active disease. UA negative. VSS with fever. Recently treated for RML pneumonia on 09/05/18 at [place] 2/2 to acute trauma. - continue to monitor closely for signs of infection  - if develops dyspnea or fever will obtain repeat CXR, blood culture, urine culture, UA and start antibiotics - AM CBC  Macrocytic Anemia: Hgb 9.9, MCV 103.2. Baseline 12-13. Last Hgb 11.6 with normal MCV on 09/09/18. Unable to find iron studies in chart. Home meds: iron supplement. No signs of bleeding on exam.  - continue oral iron  - follow up iron studies, ferritin, B12, folate, TSH - f/u AM CBC  HFrEF (45-50%)  Pacemaker:  Echo 04/13/18 with EF of 45-50%. Home meds: Coreg 12.5mg  BID, ASA, Plavix, and Lasix 20mg  QD  - continue Coreg 12.5mg  BID - hold ASA, Plavix, Lasix.  - AM EKG  Advanced Dementia: Home meds: Aricept and Namenda. Unclear if any benefit to continuing these at dc - hold home meds  Vitamin D Deficiency: Home meds: Cholecalciferol - hold home meds  Severe Protein calorie malnutrition: Albumin 2.6. Cachetic appearing on exam.  - nutrition consulted  FEN/GI: Heart Healthy Prophylaxis: SCDs  Disposition: Med-surg  History of Present Illness:  Maurice Mckee is a 85 y.o. male presenting after a fall at [facility] with a right femoral fractur. History limited from patient due to dementia. History provided by son Maurice Mckee. Has been at rehab for ~3 weeks after left hip fracture repair on 09/04/17. Supposed to be released tomorrow.Marland Kitchen He fell again  while at rehab. Was informed that he fell ~2 days ago. He was having some pain on his right hip which prompted SNF to get right hip x-ray which showed the fracture.   [name] notes patient has severe baseline dementia with memory  issues. Does have acute times of worsening dementia. At baseline he is able to get around with walker. Prior to SNF he lived with his other son Maurice Mckee) with two full time home health nurses. Per son, ADL's are very limited. Wears depends but can occasionally get up to go the bathroom.  ED Course: Patient presented hemodynamcially stable. Labs: CoVID negative, Na 141, K 4.2, BUN 17, Cr 0.94, WBC 20.6, ANC 14.3, Hgb 9.9, MCV 103.2, PT/INR 14.7/1.2, UA negative, BNP 113, Trop <0.03, CXR negative, CT heat with mild diffuse cortical atrophy and mild chronic ischemic white matter disease without acute bleed. Hip x-ray with acute right intertrochanteric femur fracture and unchanged left hip hemiarthroplasty without hardware complication. CXR negative for acute disease. Ortho consulted in ED recommend surgery however family would like to further discuss prior to making a decision. Patient admitted for further management.   Review Of Systems: Per HPI with the following additions:   Level 5 caveat. History provided family.  Review of Systems  Unable to perform ROS: Dementia    Patient Active Problem List   Diagnosis Date Noted  . Multiple falls 03/18/2018  . Fracture of inferior pubic ramus ([place]) 03/18/2018  . Delirium 03/18/2018  . BPH (benign prostatic hyperplasia) 03/18/2018  . 'light-for-dates' infant with signs of fetal malnutrition 09/09/2017  . Urinary urgency 06/21/2017  . Spinal stenosis of lumbar region 05/26/2017  . Fracture of two ribs of right side, closed, initial encounter 01/25/2017  . Anemia, normocytic normochromic 11/09/2016  . Non-seasonal allergic rhinitis 02/15/2016  . Risk for falls 11/16/2015  . Vision problem 11/16/2015  . Vitamin D deficiency 07/29/2015  . Urinary incontinence 07/08/2015  . Pulmonary emphysema ([place]) 11/26/2014  . 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 Primary osteoarthritis of right hip 08/30/2014  . Hyperlipidemia  05/10/2014  . Arthralgia of right hip 12/24/2013  . Enlarged prostate without lower urinary tract symptoms (luts) 12/24/2013  . Hyperglycemia 12/24/2013  . Numbness 12/24/2013  . Chronic systolic congestive heart failure ([place]) 06/11/2013  . Congestive heart failure ([place]) 06/11/2013  . Dementia without behavioral disturbance ([place]) 06/11/2013  . Essential hypertension 06/11/2013  . Primary cardiomyopathy ([place]) 06/11/2013  . Abnormal glucose 03/02/2013  . Diarrhea 03/02/2013  . Insomnia 03/02/2013  . Abnormality of gait 10/25/2012  . Encounter for counseling 10/25/2012  . Generalized osteoarthritis of multiple sites 10/25/2012  . Skin sensation disturbance 10/25/2012  . Backache 08/20/2011  . Other allergy, other than to medicinal agents 01/19/2010    Past Medical History: Past Medical History:  Diagnosis Date  . Arthritis   . Cancer ([place])   . Cardiomyopathy ([place])   . CHF (congestive heart failure) ([place])   . COPD (chronic obstructive pulmonary disease) ([place])   . Coronary artery disease   . Dementia ([place])   . Hyperlipidemia   . Hypertension     Past Surgical History: Past Surgical History:  Procedure Laterality Date  . CARDIAC PACEMAKER PLACEMENT      Social History: Social History   Tobacco Use  . Smoking status: Never Smoker  . Smokeless tobacco: Never Used  Substance Use Topics  . Alcohol use: Never    Frequency: Never  . Drug use: Never   Additional social history: No  alcohol.  Please also refer to relevant sections of EMR.  Family History: Family History  Problem Relation Age of Onset  . Cancer Mother    Allergies and Medications: No Known Allergies No current facility-administered medications on file prior to encounter.    Current Outpatient Medications on File Prior to Encounter  Medication Sig Dispense Refill  . atorvastatin (LIPITOR) 40 MG tablet 1 tablet.    . carvedilol (COREG) 12.5 MG tablet Take 1 tablet by mouth.    . cholecalciferol  (VITAMIN D) 1000 units tablet Take 1,000 Units by mouth daily.    . clopidogrel (PLAVIX) 75 MG tablet 1 tablet.    . donepezil (ARICEPT) 10 MG tablet Take 10 mg by mouth at bedtime.    . furosemide (LASIX) 40 MG tablet Take 40 mg by mouth daily.    . tamsulosin (FLOMAX) 0.4 MG CAPS capsule Take 0.4 mg by mouth.      Objective: BP (!) 165/95   Pulse 100   Temp 99.2 F (37.3 C) (Rectal)   Resp 16   Ht 5\' 11"  (1.803 m)   Wt 65.8 kg   SpO2 93%   BMI 20.23 kg/m  Exam: General: sleeping upon entering exam, frail and cachectic, well nourished, well developed, in no acute distress with non-toxic appearance. Able to follow commands HEENT: normocephalic, atraumatic CV: regular rate and rhythm without murmurs, rubs, or gallops, no lower extremity edema, 2+ radial and pedal pulses bilaterally, pacemaker in place along right upper chest Lungs: lung exam difficult given inability to move and severe dementia, but no obvious crackles appreciated, normal work of breathing on room air Abdomen: soft, non-tender, non-distended,normoactive bowel sounds Skin: warm, dry, no hematoma or signs of open fracture on inspection of right hip, left hip normal appearing Extremities: warm and well perfused MSK: right hip internally rotated in bed, tender to palpation, skin soft, no ecchymoses,  Neuro: Alert but not oriented, pleasantly demented, answers questions but unintelligible   Labs and Imaging: CBC BMET  Recent Labs  Lab 10/10/18 1300  WBC 20.6*  HGB 9.9*  HCT 31.9*  PLT 223   Recent Labs  Lab 10/10/18 1300  NA 141  K 4.2  CL 107  CO2 25  BUN 17  CREATININE 0.94  GLUCOSE 136*  CALCIUM 8.6*     COVID neg PT/INR: 14.7/1.2 PTT: 36 BNP: 113 Trop: <0.03  Urinalysis    Component Value Date/Time   COLORURINE YELLOW 10/10/2018 [place] 10/10/2018 1415   LABSPEC 1.018 10/10/2018 1415   PHURINE 7.0 10/10/2018 1415   GLUCOSEU NEGATIVE 10/10/2018 1415   HGBUR NEGATIVE  10/10/2018 [place] 10/10/2018 [place] 10/10/2018 1415   PROTEINUR NEGATIVE 10/10/2018 1415   NITRITE NEGATIVE 10/10/2018 [place] 10/10/2018 1415   Dg Chest 1 View  Result Date: 10/10/2018 CLINICAL DATA:  Right hip fracture. EXAM: CHEST  1 VIEW COMPARISON:  Chest x-ray dated September 04, 2018. FINDINGS: Unchanged left chest wall pacemaker. Stable cardiomediastinal silhouette. Normal pulmonary vascularity. No focal consolidation, pleural effusion, or pneumothorax. No acute osseous abnormality. IMPRESSION: No active disease. Electronically Signed   By: Titus Dubin M.D.   On: 10/10/2018 16:16   Ct Head Wo Contrast  Result Date: 10/10/2018 CLINICAL DATA:  Altered level of consciousness. EXAM: CT HEAD WITHOUT CONTRAST TECHNIQUE: Contiguous axial images were obtained from the base of the skull through the vertex without intravenous contrast. COMPARISON:  CT scan of August 13, 2018. FINDINGS: Brain: Mild diffuse cortical atrophy is noted. Mild chronic ischemic white matter disease is noted. No mass effect or midline shift is noted. Ventricular size is within normal limits. There is no evidence of mass lesion, hemorrhage or acute infarction. Vascular: No hyperdense vessel or unexpected calcification. Skull: Normal. Negative for fracture or focal lesion. Sinuses/Orbits: Complete opacification of left maxillary sinus is again noted. Other: None. IMPRESSION: Mild diffuse cortical atrophy. Mild chronic ischemic white matter disease. No acute intracranial abnormality seen. Electronically Signed   By: Marijo Conception M.D.   On: 10/10/2018 13:55   Dg Hip Unilat With Pelvis 2-3 Views Left  Result Date: 10/10/2018 CLINICAL DATA:  Right hip fracture. [place] out of a wheelchair 2 days ago. EXAM: DG HIP (WITH OR WITHOUT PELVIS) 2-3V LEFT COMPARISON:  Pelvic x-ray dated September 05, 2018. FINDINGS: Acute comminuted, displaced, and angulated right intertrochanteric  femur fracture. No dislocation. Unchanged left hip hemiarthroplasty. No evidence of hardware failure or loosening. Old fracture of the right inferior pubic ramus again noted. The pubic symphysis and sacroiliac joints are intact. Osteopenia. Soft tissues are unremarkable. IMPRESSION: 1. Acute right intertrochanteric femur fracture. 2. Unchanged left hip hemiarthroplasty without hardware complication. Electronically Signed   By: Titus Dubin M.D.   On: 10/10/2018 16:15   Dg Hip Unilat With Pelvis 2-3 Views Right  Result Date: 10/10/2018 CLINICAL DATA:  Fall, hip fracture EXAM: DG HIP (WITH OR WITHOUT PELVIS) 2-3V RIGHT COMPARISON:  10/10/2018 FINDINGS: There is an acute displaced and angulated right hip intertrochanteric fracture. Bones are osteopenic. No subluxation or dislocation. Peripheral atherosclerosis noted. Remote right inferior ramus fracture with healed deformity. IMPRESSION: Acute displaced and angulated right hip intertrochanteric fracture. Electronically Signed   By: Jerilynn Mages.  Shick M.D.   On: 10/10/2018 17:17    Danna Hefty, DO 10/10/2018, 5:35 PM PGY-1, [place] Intern pager: [phone], text pages welcome --------------------------------------------------------------------------------------------------------------- Upper Level Addendum: I have seen and evaluated this patient along with Dr. Tarry Kos and reviewed the above note, making necessary revisions in blue.  Guadalupe Dawn MD PGY-2 Family Medicine Resident

## 2018-10-10 NOTE — ED Provider Notes (Signed)
[facility] EMERGENCY DEPARTMENT Provider Note   CSN: [id number] Arrival date & time: 10/10/18  1209    History   Chief Complaint Chief Complaint  Patient presents with  . Fall    HPI Maurice Mckee is a 85 y.o. male.   Level 5 caveat  HPI   85 year old man presents today via EMS from rehab facility with report of right hip fracture.  EMS report to RN was that patient fell from chair 2 days ago, however additional history from his son reveals that he had a fall last week also.  X-Maclean Foister obtained at the facility significant for right hip fracture.  He was transported to the ED for further evaluation and treatment.  He recently had a left hip fracture which was repaired at [facility].  He has dementia at baseline and previously lived with his son.  He has been ambulatory only with assistance.  In discussion with his son, his current presentation appears consistent with his dementia.  However, he is hypoxic and according to his son he is not normally on oxygen or has significant breathing problems although he has a history of COPD.  Previously, his son Maurice Mckee, is the power of attorney and in consultation with his siblings he had been CPR but no intubation.  I discussed CODE STATUS with his son today and he is considering not having compressions done either.  We have also discussed whether or not they should proceed with further hip surgery.  Currently, he is contacting his siblings. Past Medical History:  Diagnosis Date  . Arthritis   . Cancer ([place])   . Cardiomyopathy ([place])   . CHF (congestive heart failure) ([place])   . COPD (chronic obstructive pulmonary disease) ([place])   . Coronary artery disease   . Dementia ([place])   . Hyperlipidemia   . Hypertension     Patient Active Problem List   Diagnosis Date Noted  . Multiple falls 03/18/2018  . Fracture of inferior pubic ramus ([place]) 03/18/2018  . Delirium 03/18/2018  . BPH (benign prostatic hyperplasia)  03/18/2018  . 'light-for-dates' infant with signs of fetal malnutrition 09/09/2017  . Urinary urgency 06/21/2017  . Spinal stenosis of lumbar region 05/26/2017  . Fracture of two ribs of right side, closed, initial encounter 01/25/2017  . Anemia, normocytic normochromic 11/09/2016  . Non-seasonal allergic rhinitis 02/15/2016  . Risk for falls 11/16/2015  . Vision problem 11/16/2015  . Vitamin D deficiency 07/29/2015  . Urinary incontinence 07/08/2015  . Pulmonary emphysema ([place]) 11/26/2014  . 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 Primary osteoarthritis of right hip 08/30/2014  . Hyperlipidemia 05/10/2014  . Arthralgia of right hip 12/24/2013  . Enlarged prostate without lower urinary tract symptoms (luts) 12/24/2013  . Hyperglycemia 12/24/2013  . Numbness 12/24/2013  . Chronic systolic congestive heart failure ([place]) 06/11/2013  . Congestive heart failure ([place]) 06/11/2013  . Dementia without behavioral disturbance ([place]) 06/11/2013  . Essential hypertension 06/11/2013  . Primary cardiomyopathy ([place]) 06/11/2013  . Abnormal glucose 03/02/2013  . Diarrhea 03/02/2013  . Insomnia 03/02/2013  . Abnormality of gait 10/25/2012  . Encounter for counseling 10/25/2012  . Generalized osteoarthritis of multiple sites 10/25/2012  . Skin sensation disturbance 10/25/2012  . Backache 08/20/2011  . Other allergy, other than to medicinal agents 01/19/2010    Past Surgical History:  Procedure Laterality Date  . CARDIAC PACEMAKER PLACEMENT          Home Medications    Prior to  Admission medications   Medication Sig Start Date End Date Taking? Authorizing Provider  atorvastatin (LIPITOR) 40 MG tablet 1 tablet. 11/09/16   [provider]  carvedilol (COREG) 12.5 MG tablet Take 1 tablet by mouth. 11/09/16   [provider]  cholecalciferol (VITAMIN D) 1000 units tablet Take 1,000 Units by mouth daily.    [provider]  clopidogrel  (PLAVIX) 75 MG tablet 1 tablet. 11/09/16   [provider]  donepezil (ARICEPT) 10 MG tablet Take 10 mg by mouth at bedtime.    [provider]  furosemide (LASIX) 40 MG tablet Take 40 mg by mouth daily.    [provider]  tamsulosin (FLOMAX) 0.4 MG CAPS capsule Take 0.4 mg by mouth.    [provider]    Family History Family History  Problem Relation Age of Onset  . Cancer Mother     Social History Social History   Tobacco Use  . Smoking status: Never Smoker  . Smokeless tobacco: Never Used  Substance Use Topics  . Alcohol use: Never    Frequency: Never  . Drug use: Never     Allergies   Patient has no known allergies.   Review of Systems Review of Systems  Unable to perform ROS: Dementia     Physical Exam Updated Vital Signs BP (!) 171/80 (BP Location: Left Arm)   Pulse 95   Temp 99.2 F (37.3 C) (Rectal)   Resp 16   Ht 1.803 m (5\' 11" )   Wt 65.8 kg   SpO2 (!) 89%   BMI 20.23 kg/m   Physical Exam Vitals signs and nursing note reviewed.  Constitutional:      General: He is not in acute distress.    Appearance: He is ill-appearing.  HENT:     Head: Normocephalic.     Right Ear: External ear normal.     Left Ear: External ear normal.     Nose: Nose normal.     Mouth/Throat:     Mouth: Mucous membranes are dry.  Eyes:     Comments: Patient will not open his eyes for evaluation  Neck:     Musculoskeletal: Normal range of motion.  Cardiovascular:     Rate and Rhythm: Normal rate and regular rhythm.     Pulses: Normal pulses.  Pulmonary:     Effort: Pulmonary effort is normal.  Abdominal:     Palpations: Abdomen is soft.  Musculoskeletal:     Comments: Left hip with staples in place and wound appears clean without significant tenderness Right hip with obvious deformity but is tender to palpation Upper extremities have multiple ecchymosis but no bony deformity is noted  Skin:    General: Skin is warm and dry.      Capillary Refill: Capillary refill takes less than 2 seconds.     Comments: Some sacral skin breakdown  Neurological:     General: No focal deficit present.     Mental Status: He is alert.     Comments: Patient is somewhat confused but does respond to [place] regarding pain questions.  He is not oriented to place but is able to tell us his name there are no lateralizing symptoms present      ED Treatments / Results  Labs (all labs ordered are listed, but only abnormal results are displayed) Labs Reviewed  CBC WITH DIFFERENTIAL/PLATELET - Abnormal; Notable for the following components:      Result Value   WBC 20.6 (*)  RBC 3.09 (*)    Hemoglobin 9.9 (*)    HCT 31.9 (*)    MCV 103.2 (*)    RDW 15.6 (*)    Neutro Abs 14.3 (*)    Monocytes Absolute 2.1 (*)    Abs Immature Granulocytes 0.16 (*)    All other components within normal limits  URINE CULTURE  SARS CORONAVIRUS 2 (HOSPITAL ORDER, [facility])  APTT  PROTIME-INR  COMPREHENSIVE METABOLIC PANEL  URINALYSIS, ROUTINE W REFLEX MICROSCOPIC  TYPE AND SCREEN    EKG None  Radiology Ct Head Wo Contrast  Result Date: 10/10/2018 CLINICAL DATA:  Altered level of consciousness. EXAM: CT HEAD WITHOUT CONTRAST TECHNIQUE: Contiguous axial images were obtained from the base of the skull through the vertex without intravenous contrast. COMPARISON:  CT scan of August 13, 2018. FINDINGS: Brain: Mild diffuse cortical atrophy is noted. Mild chronic ischemic white matter disease is noted. No mass effect or midline shift is noted. Ventricular size is within normal limits. There is no evidence of mass lesion, hemorrhage or acute infarction. Vascular: No hyperdense vessel or unexpected calcification. Skull: Normal. Negative for fracture or focal lesion. Sinuses/Orbits: Complete opacification of left maxillary sinus is again noted. Other: None. IMPRESSION: Mild diffuse cortical atrophy. Mild chronic ischemic white matter  disease. No acute intracranial abnormality seen. Electronically Signed   By: Marijo Conception M.D.   On: 10/10/2018 13:55    Procedures Procedures (including critical care time)  Medications Ordered in ED Medications  morphine 2 MG/ML injection 1 mg (has no administration in time range)     Initial Impression / Assessment and Plan / ED Course  I have reviewed the triage vital signs and the nursing notes.  Pertinent labs & imaging results that were available during my care of the patient were reviewed by me and considered in my medical decision making (see chart for details).       85 yo male with dementia and right hip fracture here from rehab facility with fall at least 2 days prior and reported new fx, although there for recent left hip fx s/p orif. 1- hip x-Ayisha Pol pending Discussed with son, Maurice Mckee, [phone] He will discuss with sibs how to proceed but, for now, likely surgical candidate as prior to last fx,patient was ambulatory although with assistance Maurice Mckee was evaluated in Emergency Department on 10/10/2018 for the symptoms described in the history of present illness. He was evaluated in the context of the global COVID-19 pandemic, which necessitated consideration that the patient might be at risk for infection with the SARS-CoV-2 virus that causes COVID-19. Institutional protocols and algorithms that pertain to the evaluation of patients at risk for COVID-19 are in a state of rapid change based on information released by regulatory bodies including the CDC and federal and state organizations. These policies and algorithms were followed during the patient's care in the ED.  2- low sats- patient with cough  covid pending cxr pending Wbc elevated at 20,000  3- DNR status discussed with son. Currently wants no intubation.  Previously discussed compressions or not- he will discuss with sibs and will need follow up.  Likely would benefit from goals of care and palliative  consult.    Final Clinical Impressions(s) / ED Diagnoses   Final diagnoses:  Closed fracture of right hip, initial encounter [facility])  Hypoxia    ED Discharge Orders    None       Pattricia Boss, MD 10/10/18 [phone]

## 2018-10-10 NOTE — Consult Note (Signed)
ORTHOPAEDIC CONSULTATION  REQUESTING PHYSICIAN: Martyn Malay, MD  PCP:  Patient, No Pcp Per  Chief Complaint: Right hip pain  HPI: Maurice Mckee is a 85 y.o. male who complains of right hip pain and inability to weight-bear following a ground-level fall approximately 2 days ago.  Briefly, he has fairly advanced dementia at baseline and much of the history is obtained via chart review and discussion with the emergency department provider as well as the patient's son, Maurice Mckee.  Back in April he did have a left femoral neck fracture that was treated with a hemiarthroplasty.  He was rehabilitating that at a skilled nursing facility when he was found to have a ground-level fall 2 days ago.  X-rays at that time indicated a right intratrochanteric hip fracture.  He presented to our emergency department today for definitive care.  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  He does require a walker at baseline.  When he is not with his medical assistant he is very minimally ambulatory.  Past Medical History:  Diagnosis Date   Arthritis    Cancer ([place])    Cardiomyopathy ([place])    CHF (congestive heart failure) (HCC)    COPD (chronic obstructive pulmonary disease) (HCC)    Coronary artery disease    Dementia ([place])    Hyperlipidemia    Hypertension    Past Surgical History:  Procedure Laterality Date   CARDIAC PACEMAKER PLACEMENT     Social History   Socioeconomic History   Marital status: Widowed    Spouse name: Not on file   Number of children: Not on file   Years of education: Not on file   Highest education level: Not on file  Occupational History   Not on file  Social Needs   Financial resource strain: Not on file   Food insecurity:    Worry: Not on file    Inability: Not on file   Transportation needs:    Medical: Not on file    Non-medical: Not on file  Tobacco Use   Smoking status: Never Smoker   Smokeless  tobacco: Never Used  Substance and Sexual Activity   Alcohol use: Never    Frequency: Never   Drug use: Never   Sexual activity: Not on file  Lifestyle   Physical activity:    Days per week: Not on file    Minutes per session: Not on file   Stress: Not on file  Relationships   Social connections:    Talks on phone: Not on file    Gets together: Not on file    Attends religious service: Not on file    Active member of club or organization: Not on file    Attends meetings of clubs or organizations: Not on file    Relationship status: Not on file  Other Topics Concern   Not on file  Social History Narrative   Not on file   Family History  Problem Relation Age of Onset   Cancer Mother    Allergies  Allergen Reactions   Lactose Intolerance (Gi) Other (See Comments)    Unknown reaction   Prior to Admission medications   Medication Sig Start Date End Date Taking? Authorizing Provider  acetaminophen (TYLENOL) 325 MG tablet Take 650 mg by mouth every 6 (six) hours as needed (pain, fever >101).   Yes [provider]  aspirin EC 325 MG tablet Take 325 mg by mouth daily.  Yes [provider]  atorvastatin (LIPITOR) 40 MG tablet Take 40 mg by mouth daily.  11/09/16  Yes [provider]  carvedilol (COREG) 12.5 MG tablet Take 12.5 mg by mouth 2 (two) times daily with a meal.  11/09/16  Yes [provider]  cholecalciferol (VITAMIN D) 1000 units tablet Take 1,000 Units by mouth daily.   Yes [provider]  clopidogrel (PLAVIX) 75 MG tablet Take 75 mg by mouth daily.  11/09/16  Yes [provider]  diphenoxylate-atropine (LOMOTIL) 2.5-0.025 MG tablet Take 2 tablets by mouth every 6 (six) hours as needed for diarrhea or loose stools.   Yes [provider]  donepezil (ARICEPT) 10 MG tablet Take 10 mg by mouth at bedtime.   Yes [provider]  ferrous sulfate 325 (65 FE) MG tablet Take 325 mg by mouth daily.    Yes [provider]  fluticasone (FLONASE) 50 MCG/ACT nasal spray Place 2 sprays into both nostrils daily.   Yes [provider]  furosemide (LASIX) 20 MG tablet Take 20 mg by mouth daily.    Yes [provider]  Guaifenesin (MUCINEX MAXIMUM STRENGTH) 1200 MG TB12 Take 1,200 mg by mouth every 12 (twelve) hours.   Yes [provider]  memantine (NAMENDA) 5 MG tablet Take 5 mg by mouth 2 (two) times daily with a meal.   Yes [provider]   Dg Chest 1 View  Result Date: 10/10/2018 CLINICAL DATA:  Right hip fracture. EXAM: CHEST  1 VIEW COMPARISON:  Chest x-ray dated September 04, 2018. FINDINGS: Unchanged left chest wall pacemaker. Stable cardiomediastinal silhouette. Normal pulmonary vascularity. No focal consolidation, pleural effusion, or pneumothorax. No acute osseous abnormality. IMPRESSION: No active disease. Electronically Signed   By: Titus Dubin M.D.   On: 10/10/2018 16:16   Ct Head Wo Contrast  Result Date: 10/10/2018 CLINICAL DATA:  Altered level of consciousness. EXAM: CT HEAD WITHOUT CONTRAST TECHNIQUE: Contiguous axial images were obtained from the base of the skull through the vertex without intravenous contrast. COMPARISON:  CT scan of August 13, 2018. FINDINGS: Brain: Mild diffuse cortical atrophy is noted. Mild chronic ischemic white matter disease is noted. No mass effect or midline shift is noted. Ventricular size is within normal limits. There is no evidence of mass lesion, hemorrhage or acute infarction. Vascular: No hyperdense vessel or unexpected calcification. Skull: Normal. Negative for fracture or focal lesion. Sinuses/Orbits: Complete opacification of left maxillary sinus is again noted. Other: None. IMPRESSION: Mild diffuse cortical atrophy. Mild chronic ischemic white matter disease. No acute intracranial abnormality seen. Electronically Signed   By: Marijo Conception M.D.   On: 10/10/2018 13:55   Dg Hip Unilat With Pelvis 2-3 Views  Left  Result Date: 10/10/2018 CLINICAL DATA:  Right hip fracture. [place] out of a wheelchair 2 days ago. EXAM: DG HIP (WITH OR WITHOUT PELVIS) 2-3V LEFT COMPARISON:  Pelvic x-ray dated September 05, 2018. FINDINGS: Acute comminuted, displaced, and angulated right intertrochanteric femur fracture. No dislocation. Unchanged left hip hemiarthroplasty. No evidence of hardware failure or loosening. Old fracture of the right inferior pubic ramus again noted. The pubic symphysis and sacroiliac joints are intact. Osteopenia. Soft tissues are unremarkable. IMPRESSION: 1. Acute right intertrochanteric femur fracture. 2. Unchanged left hip hemiarthroplasty without hardware complication. Electronically Signed   By: Titus Dubin M.D.   On: 10/10/2018 16:15   Dg Hip Unilat With Pelvis 2-3 Views Right  Result Date: 10/10/2018 CLINICAL DATA:  Fall, hip fracture EXAM: DG  HIP (WITH OR WITHOUT PELVIS) 2-3V RIGHT COMPARISON:  10/10/2018 FINDINGS: There is an acute displaced and angulated right hip intertrochanteric fracture. Bones are osteopenic. No subluxation or dislocation. Peripheral atherosclerosis noted. Remote right inferior ramus fracture with healed deformity. IMPRESSION: Acute displaced and angulated right hip intertrochanteric fracture. Electronically Signed   By: Jerilynn Mages.  Shick M.D.   On: 10/10/2018 17:17    Positive ROS: All other systems have been reviewed and were otherwise negative with the exception of those mentioned in the HPI and as above.  Physical Exam: General: Alert, no acute distress Cardiovascular: No pedal edema Respiratory: No cyanosis, no use of accessory musculature GI: No organomegaly, abdomen is soft and non-tender Skin: No lesions in the area of chief complaint Neurologic: Sensation intact distally Psychiatric: Patient is competent for consent with normal mood and affect Lymphatic: No axillary or cervical lymphadenopathy  MUSCULOSKELETAL:  Right lower extremity is nondeformed with no open  wounds.  He has tenderness at the groin with logroll.  Otherwise he moves spontaneously throughout, and response to pain.  2+ dorsalis pedis pulse.  Assessment: Right hip intertrochanteric fracture.  Plan: -This is an operative injury that would best be managed with an intramedullary nail of the right hip with early patient mobilization.  -At this time the patient is recovering from a left hip fracture with hemiarthroplasty just 1 month prior to today's presentation.  The family is a little bit concerned about moving forward with another surgery in such short duration.  They have question me regarding potential for nonoperative management.  My recommendation is to move forward with surgery to allow ambulation.  Should he not have surgery will be bed stricken for the remainder of his life and likely have some morbidity related to being bedbound.  I have had that recommendation discussion with Maurice Mckee, his oldest son and healthcare power of attorney.  He would like to consider surgical versus nonsurgical options with his siblings.  I will check with him in the morning to see what direction he would like to go.  -At this time he may have a diet.  He should be nonweightbearing and bedrest.  Please make him n.p.o. at midnight for potential surgery tomorrow.  Otherwise we will update the medical record in the morning as to the family's desired treatment.    Nicholes Stairs, MD Cell [phone]    10/10/2018 6:33 PM

## 2018-10-10 NOTE — ED Triage Notes (Signed)
Pt here from [facility]. EMS called for right hip fracture confirmed by xray. Pt fell from wheelchair 2 days ago. At [facility] for rehab for recent left hip fracture. Upon arrival pt found to have O2 sats of 79%. Placed on nonrebreather by EMS.

## 2018-10-11 ENCOUNTER — Other Ambulatory Visit: Payer: Self-pay

## 2018-10-11 ENCOUNTER — Encounter (HOSPITAL_COMMUNITY): Payer: Self-pay

## 2018-10-11 DIAGNOSIS — L8990 Pressure ulcer of unspecified site, unspecified stage: Secondary | ICD-10-CM

## 2018-10-11 DIAGNOSIS — R0902 Hypoxemia: Secondary | ICD-10-CM

## 2018-10-11 DIAGNOSIS — Z515 Encounter for palliative care: Secondary | ICD-10-CM

## 2018-10-11 DIAGNOSIS — I5032 Chronic diastolic (congestive) heart failure: Secondary | ICD-10-CM

## 2018-10-11 DIAGNOSIS — M25551 Pain in right hip: Secondary | ICD-10-CM

## 2018-10-11 DIAGNOSIS — S72001D Fracture of unspecified part of neck of right femur, subsequent encounter for closed fracture with routine healing: Secondary | ICD-10-CM

## 2018-10-11 DIAGNOSIS — M8000XA Age-related osteoporosis with current pathological fracture, unspecified site, initial encounter for fracture: Secondary | ICD-10-CM

## 2018-10-11 DIAGNOSIS — I10 Essential (primary) hypertension: Secondary | ICD-10-CM

## 2018-10-11 DIAGNOSIS — S72001A Fracture of unspecified part of neck of right femur, initial encounter for closed fracture: Secondary | ICD-10-CM

## 2018-10-11 DIAGNOSIS — Z7189 Other specified counseling: Secondary | ICD-10-CM

## 2018-10-11 LAB — COMPREHENSIVE METABOLIC PANEL
ALT: 18 U/L (ref 0–44)
AST: 15 U/L (ref 15–41)
Albumin: 2.2 g/dL — ABNORMAL LOW (ref 3.5–5.0)
Alkaline Phosphatase: 116 U/L (ref 38–126)
Anion gap: 6 (ref 5–15)
BUN: 17 mg/dL (ref 8–23)
CO2: 25 mmol/L (ref 22–32)
Calcium: 8.2 mg/dL — ABNORMAL LOW (ref 8.9–10.3)
Chloride: 107 mmol/L (ref 98–111)
Creatinine, Ser: 0.8 mg/dL (ref 0.61–1.24)
GFR calc Af Amer: >60 mL/min (ref >60)
GFR calc non Af Amer: >60 mL/min (ref >60)
Glucose, Bld: 119 mg/dL — ABNORMAL HIGH (ref 70–99)
Potassium: 4.1 mmol/L (ref 3.5–5.1)
Sodium: 138 mmol/L (ref 135–145)
Total Bilirubin: 0.8 mg/dL (ref 0.3–1.2)
Total Protein: 4.7 g/dL — ABNORMAL LOW (ref 6.5–8.1)

## 2018-10-11 LAB — PROTIME-INR
INR: 1.2 (ref 0.8–1.2)
Prothrombin Time: 15.4 seconds — ABNORMAL HIGH (ref 11.4–15.2)

## 2018-10-11 LAB — CBC WITH DIFFERENTIAL/PLATELET
Abs Immature Granulocytes: 0.1 10*3/uL — ABNORMAL HIGH (ref 0.00–0.07)
Basophils Absolute: 0.1 10*3/uL (ref 0.0–0.1)
Basophils Relative: 0 %
Eosinophils Absolute: 0.1 10*3/uL (ref 0.0–0.5)
Eosinophils Relative: 0 %
HCT: 28 % — ABNORMAL LOW (ref 39.0–52.0)
Hemoglobin: 9 g/dL — ABNORMAL LOW (ref 13.0–17.0)
Immature Granulocytes: 1 %
Lymphocytes Relative: 19 %
Lymphs Abs: 3.4 10*3/uL (ref 0.7–4.0)
MCH: 32.1 pg (ref 26.0–34.0)
MCHC: 32.1 g/dL (ref 30.0–36.0)
MCV: 100 fL (ref 80.0–100.0)
Monocytes Absolute: 2 10*3/uL — ABNORMAL HIGH (ref 0.1–1.0)
Monocytes Relative: 11 %
Neutro Abs: 12.2 10*3/uL — ABNORMAL HIGH (ref 1.7–7.7)
Neutrophils Relative %: 69 %
Platelets: 208 10*3/uL (ref 150–400)
RBC: 2.8 MIL/uL — ABNORMAL LOW (ref 4.22–5.81)
RDW: 15.3 % (ref 11.5–15.5)
WBC: 17.8 10*3/uL — ABNORMAL HIGH (ref 4.0–10.5)
nRBC: 0 % (ref 0.0–0.2)

## 2018-10-11 LAB — SURGICAL PCR SCREEN
MRSA, PCR: NEGATIVE
Staphylococcus aureus: NEGATIVE

## 2018-10-11 LAB — URINE CULTURE

## 2018-10-11 LAB — IRON AND TIBC
Iron: 16 ug/dL — ABNORMAL LOW (ref 45–182)
Saturation Ratios: 9 % — ABNORMAL LOW (ref 17.9–39.5)
TIBC: 176 ug/dL — ABNORMAL LOW (ref 250–450)
UIBC: 160 ug/dL

## 2018-10-11 LAB — TSH: TSH: 3.805 u[IU]/mL (ref 0.350–4.500)

## 2018-10-11 LAB — VITAMIN B12: Vitamin B-12: 243 pg/mL (ref 180–914)

## 2018-10-11 LAB — FERRITIN: Ferritin: 393 ng/mL — ABNORMAL HIGH (ref 24–336)

## 2018-10-11 MED ORDER — WHITE PETROLATUM EX OINT
TOPICAL_OINTMENT | CUTANEOUS | Status: AC
  Administered 2018-10-11: 07:00:00
  Filled 2018-10-11: qty 28.35

## 2018-10-11 MED ORDER — ENSURE SURGERY PO LIQD
237.0000 mL | Freq: Two times a day (BID) | ORAL | Status: DC
  Administered 2018-10-12 – 2018-10-18 (×10): 237 mL via ORAL
  Filled 2018-10-11 (×15): qty 237

## 2018-10-11 MED ORDER — CARVEDILOL 12.5 MG PO TABS: 12.5000 mg | ORAL_TABLET | Freq: Once | ORAL | Status: DC

## 2018-10-11 MED ORDER — ENSURE SURGERY PO LIQD
237.0000 mL | Freq: Three times a day (TID) | ORAL | Status: AC
  Administered 2018-10-11 (×2): 237 mL via ORAL
  Filled 2018-10-11 (×2): qty 237

## 2018-10-11 MED ORDER — CEFAZOLIN SODIUM-DEXTROSE 2-4 GM/100ML-% IV SOLN
2.0000 g | INTRAVENOUS | Status: DC
  Filled 2018-10-11: qty 100

## 2018-10-11 MED ORDER — ADULT MULTIVITAMIN LIQUID CH
15.0000 mL | Freq: Every day | ORAL | Status: DC
  Administered 2018-10-11 – 2018-10-15 (×4): 15 mL via ORAL
  Filled 2018-10-11 (×5): qty 15

## 2018-10-11 MED ORDER — ATORVASTATIN CALCIUM 40 MG PO TABS
40.0000 mg | ORAL_TABLET | Freq: Every day | ORAL | Status: DC
  Administered 2018-10-11 – 2018-10-20 (×10): 40 mg via ORAL
  Filled 2018-10-11 (×10): qty 1

## 2018-10-11 MED ORDER — FUROSEMIDE 20 MG PO TABS
20.0000 mg | ORAL_TABLET | Freq: Every day | ORAL | Status: DC
  Administered 2018-10-11: 14:00:00 20 mg via ORAL
  Filled 2018-10-11: qty 1

## 2018-10-11 MED ORDER — CHLORHEXIDINE GLUCONATE 4 % EX LIQD
60.0000 mL | Freq: Once | CUTANEOUS | Status: AC
  Administered 2018-10-12: 4 via TOPICAL
  Filled 2018-10-11: qty 60

## 2018-10-11 MED ORDER — BOOST / RESOURCE BREEZE PO LIQD CUSTOM
1.0000 | Freq: Two times a day (BID) | ORAL | Status: DC
  Administered 2018-10-12 – 2018-10-15 (×5): 1 via ORAL

## 2018-10-11 MED ORDER — POVIDONE-IODINE 10 % EX SWAB: 2.0000 "application " | Freq: Once | CUTANEOUS | Status: DC

## 2018-10-11 NOTE — Progress Notes (Addendum)
Updated plan of care:  I have spoken again with the patient's son, Jenson Beedle his healthcare power of attorney, he and the family have elected to proceed with right hip  Surgery.  The plan will be for surgery tomorrow morning.  Mr. Dunagan is okay for a full diet today.  He should be nothing by mouth tonight at midnight and hold any chemical DVT prophylaxis, please.

## 2018-10-11 NOTE — Social Work (Signed)
CSW acknowledging consult for pt being from [facility]. Will follow for therapy recommendations and goals of care.   Westley Hummer, MSW, [place] Work [phone]

## 2018-10-11 NOTE — Consult Note (Signed)
Consultation Note Date: 10/11/2018   Patient Name: Maurice Mckee  DOB: 1925/12/09  MRN: [id number]  Age / Sex: 85 y.o., male  PCP: Patient, No Pcp Per Referring Physician: Martyn Malay, MD  Reason for Consultation: Establishing goals of care and Psychosocial/spiritual support  HPI/Patient Profile: 85 y.o. male  with past medical history of severe dementia, systolic heart failure with an EF of 45 to 50%, hypertension, hyperlipidemia, vitamin D deficiency, cardiomyopathy, active ICD, left hip fracture with repair September 04, 2018 admitted on 10/10/2018 after sustaining a fall at rehab facility.  Patient was found to have fractured his right hip  Consult ordered for goals of care as well as assisting family in making decisions regarding surgery  Clinical Assessment and Goals of Care: Patient seen, chart reviewed.  Patient has severe dementia but he is alert, able to tell me his name and some of his children's names but he does not recognize that he is in the hospital or why he is here.  He denies having any pain and he is not exhibiting any distress in terms of agitation or anxiety  Spoke to his son Amillion Macchia via phone.  Families are unable to visit secondary to COVID-19 visitor restrictions.  Francee Piccolo shares with me that patient has 6 children.  Francee Piccolo is the [employer] power of attorney as well as power of attorney but they are making decisions together as a family.  They have decided to go forward with repair of hip fracture.  Francee Piccolo states that surgical services will be in touch with him today regarding a time for surgery on 10/12/2018  Introduced palliative medicine services as an additional resource and source of support aimed at care directed by patient's goals and improve proving quality of life for both patient and family.  I also introduced comp of goals of care specifically: CODE STATUS, ICD.  Patient  at this point is eating well but dysphasia could be in his future as he declines further  Francee Piccolo shares that these are new concepts to him so we did speak at length and defined terms of full code vs DNR as well as partial code status in an in-pt setting.  I also am emailing [employer] for [employer] booklet as well as MOST form to use as a template for family discussions.  Palliative medicine team also happy to arrange a family meeting via teleconference.  Patient is unable to make his own healthcare decisions at this point secondary to severe dementia.  He has 6 children but does have a designated healthcare power of attorney who is his son, Omar Gayden who can be reached at [phone]    SUMMARY OF RECOMMENDATIONS   Family wishes to go forward with repair of hip Full scope of treatment for now.  Remains full code as family begins discussions regarding goals of care with palliative medicine Palliative medicine to remain involved and continue with goals of care discussion, hopefully can arrange a video conference with all family members Code Status/Advance Care Planning:  Full code    Symptom Management:   Right hip pain: Continue with low-dose morphine 1 mg every 4 hours as needed.  Postop this may be in an inadequate and may need to offer titration of 1 to 2 mg with a shortened interval of every 3 hours as needed  Palliative Prophylaxis:   Aspiration, Bowel Regimen, Delirium Protocol, Eye Care, Frequent Pain Assessment, Oral Care and Turn Reposition  Additional Recommendations (Limitations, Scope, Preferences):  Full Scope Treatment  Psycho-social/Spiritual:   Desire for further Chaplaincy support:no  Additional Recommendations: Referral to Community Resources   Prognosis:   Unable to determine  Discharge Planning: Home with Home Health      Primary Diagnoses: Present on Admission: . Closed right hip fracture ([place])   I have reviewed the medical record,  interviewed the patient and family, and examined the patient. The following aspects are pertinent.  Past Medical History:  Diagnosis Date  . Arthritis   . Cancer ([place])   . Cardiomyopathy ([place])   . CHF (congestive heart failure) ([place])   . COPD (chronic obstructive pulmonary disease) ([place])   . Coronary artery disease   . Dementia ([place])   . Hyperlipidemia   . Hypertension    Social History   Socioeconomic History  . Marital status: Widowed    Spouse name: Not on file  . Number of children: Not on file  . Years of education: Not on file  . Highest education level: Not on file  Occupational History  . Not on file  Social Needs  . Financial resource strain: Not on file  . Food insecurity:    Worry: Not on file    Inability: Not on file  . Transportation needs:    Medical: Not on file    Non-medical: Not on file  Tobacco Use  . Smoking status: Never Smoker  . Smokeless tobacco: Never Used  Substance and Sexual Activity  . Alcohol use: Never    Frequency: Never  . Drug use: Never  . Sexual activity: Not on file  Lifestyle  . Physical activity:    Days per week: Not on file    Minutes per session: Not on file  . Stress: Not on file  Relationships  . Social connections:    Talks on phone: Not on file    Gets together: Not on file    Attends religious service: Not on file    Active member of club or organization: Not on file    Attends meetings of clubs or organizations: Not on file    Relationship status: Not on file  Other Topics Concern  . Not on file  Social History Narrative  . Not on file   Family History  Problem Relation Age of Onset  . Cancer Mother    Scheduled Meds: . atorvastatin  40 mg Oral q1800  . carvedilol  12.5 mg Oral BID WC  . carvedilol  12.5 mg Oral Once  . furosemide  20 mg Oral Daily   Continuous Infusions: PRN Meds:.acetaminophen **OR** acetaminophen, morphine injection Medications Prior to Admission:  Prior to Admission medications    Medication Sig Start Date End Date Taking? Authorizing Provider  acetaminophen (TYLENOL) 325 MG tablet Take 650 mg by mouth every 6 (six) hours as needed (pain, fever >101).   Yes [provider]  aspirin EC 325 MG tablet Take 325 mg by mouth daily.   Yes [provider]  atorvastatin (LIPITOR) 40 MG tablet Take 40 mg by mouth  daily.  11/09/16  Yes [provider]  carvedilol (COREG) 12.5 MG tablet Take 12.5 mg by mouth 2 (two) times daily with a meal.  11/09/16  Yes [provider]  cholecalciferol (VITAMIN D) 1000 units tablet Take 1,000 Units by mouth daily.   Yes [provider]  clopidogrel (PLAVIX) 75 MG tablet Take 75 mg by mouth daily.  11/09/16  Yes [provider]  diphenoxylate-atropine (LOMOTIL) 2.5-0.025 MG tablet Take 2 tablets by mouth every 6 (six) hours as needed for diarrhea or loose stools.   Yes [provider]  donepezil (ARICEPT) 10 MG tablet Take 10 mg by mouth at bedtime.   Yes [provider]  ferrous sulfate 325 (65 FE) MG tablet Take 325 mg by mouth daily.   Yes [provider]  fluticasone (FLONASE) 50 MCG/ACT nasal spray Place 2 sprays into both nostrils daily.   Yes [provider]  furosemide (LASIX) 20 MG tablet Take 20 mg by mouth daily.    Yes [provider]  Guaifenesin (MUCINEX MAXIMUM STRENGTH) 1200 MG TB12 Take 1,200 mg by mouth every 12 (twelve) hours.   Yes [provider]  memantine (NAMENDA) 5 MG tablet Take 5 mg by mouth 2 (two) times daily with a meal.   Yes [provider]   Allergies  Allergen Reactions  . Lactose Intolerance (Gi) Other (See Comments)    Unknown reaction   Review of Systems  Unable to perform ROS: Dementia    Physical Exam Vitals signs and nursing note reviewed.  Constitutional:      Comments: Elderly, underweight man in no acute distress.  HENT:     Head: Normocephalic and atraumatic.  Cardiovascular:      Rate and Rhythm: Normal rate.     Pulses: Normal pulses.  Pulmonary:     Effort: Pulmonary effort is normal.  Musculoskeletal:     Comments: Not moving his lower extremity secondary to hip fracture, pain  Skin:    General: Skin is warm and dry.  Neurological:     Comments: Short-term and long-term memory deficits noted Oriented to self Is able to tell me some of his children's names Does not know why he is in the hospital  Psychiatric:     Comments: No agitation, patient is confused otherwise unable to perform mental status exam     Vital Signs: BP (!) 151/70 (BP Location: Left Arm)   Pulse 88   Temp 99 F (37.2 C) (Oral)   Resp 15   Ht 5\' 11"  (1.803 m)   Wt 63.6 kg   SpO2 95%   BMI 19.56 kg/m  Pain Scale: PAINAD   Pain Score: 0-No pain   SpO2: SpO2: 95 % O2 Device:SpO2: 95 % O2 Flow Rate: .O2 Flow Rate (L/min): 2 L/min  IO: Intake/output summary:   Intake/Output Summary (Last 24 hours) at 10/11/2018 1146 Last data filed at 10/11/2018 0900 Gross per 24 hour  Intake 120 ml  Output 500 ml  Net -380 ml    LBM: Last BM Date: 10/09/18 Baseline Weight: Weight: 65.8 kg Most recent weight: Weight: 63.6 kg     Palliative Assessment/Data:   Flowsheet Rows     Most Recent Value  Intake Tab  Referral Department  Hospitalist  Unit at Time of Referral  Med/Surg Unit  Palliative Care Primary Diagnosis  Trauma  Date Notified  10/10/18  Palliative Care Type  New Palliative care  Reason for referral  Psychosocial or Spiritual support, Clarify  Goals of Care  Date of Admission  10/10/18  Date first seen by Palliative Care  10/11/18  # of days Palliative referral response time  1 Day(s)  # of days IP prior to Palliative referral  0  Clinical Assessment  Palliative Performance Scale Score  40%  Pain Max last 24 hours  Not able to report  Pain Min Last 24 hours  Not able to report  Dyspnea Max Last 24 Hours  Not able to report  Dyspnea Min Last 24 hours  Not able to  report  Nausea Max Last 24 Hours  Not able to report  Nausea Min Last 24 Hours  Not able to report  Anxiety Max Last 24 Hours  Not able to report  Anxiety Min Last 24 Hours  Not able to report  Other Max Last 24 Hours  Not able to report  Psychosocial & Spiritual Assessment  Palliative Care Outcomes  Patient/Family meeting held?  Yes  Who was at the meeting?  son [name]/HCPOA  Palliative Care follow-up planned  Yes, Facility      Time In: 1100 Time Out: 1206 Time Total: 66 min Greater than 50%  of this time was spent counseling and coordinating care related to the above assessment and plan.  Signed by: Dory Horn, NP   Please contact Palliative Medicine Team phone at [phone] for questions and concerns.  For individual provider: See Shea Evans

## 2018-10-11 NOTE — Plan of Care (Signed)
  Problem: Education: Goal: Verbalization of understanding the information provided (i.e., activity precautions, restrictions, etc) will improve Outcome: Progressing   Problem: Pain Management: Goal: Pain level will decrease Outcome: Progressing   Problem: Nutrition: Goal: Adequate nutrition will be maintained Outcome: Progressing   Problem: Safety: Goal: Ability to remain free from injury will improve Outcome: Progressing   Problem: Skin Integrity: Goal: Risk for impaired skin integrity will decrease Outcome: Progressing

## 2018-10-11 NOTE — Progress Notes (Addendum)
Initial Nutrition Assessment  DOCUMENTATION CODES:  Not applicable  INTERVENTION:  Ensure Surgery Preop/post op as diet allows    Postop, Boost Breeze while on CLs w/ transition back to Ensure Surgery once diet advanced to FLs.   Liquid mvi w/ min to ensure sufficient substrate for healing of multiple fractures/wounds  NUTRITION DIAGNOSIS:  Increased nutrient needs related to post-op healing, wound healing as evidenced by estimated nutritional requirements for these outcomes  GOAL:  Patient will meet greater than or equal to 90% of their needs  MONITOR:  PO intake, Labs, I & O's, Supplement acceptance, Diet advancement, Weight trends  REASON FOR ASSESSMENT:  Consult, Malnutrition Screening Tool Hip fracture protocol  ASSESSMENT:  85 y/o male PMHx advanced dementia, HF, HTN, HLD. Has been at [facility] for rehab after a recent L hip fx s/p hemiarthroplasty 4/17. Presents with R hip pain after falling again. Hip Xray confirmed fracture. Admitted for potential surgical intervention  RD operating remotely due to covid precautions. Given pts dementia, all history obtained from chart.   After discussion, family agreed to pursue surgical intervention. Surgery is scheduled for tomorrow morning.   Per chart, pt normally lives with family members and receives 24hr care. However, due to a recent L hip fracture, he has been at a SNF the past several weeks for rehab. Apparently he struggled with acute pna following his surgery.   While there is no mention of his recent level of oral intake, his weight stability suggests he has been able to meet needs. Pt is currently 140.2 lbs. Looking at his weight measurements over the past year, his weight has been relatively stable between 135-145. He actually appears to have started to gain a slight amount of weight in the most recent months, having remained at the higher end of this range.   Pts diet has been advanced this evening and will be NPO after  midnight. Will order supplements before/after to support healing of both his recent and existing fracture and his sacral wound.   Labs: WBC: 20.6->17.8, B12- lower end of NL, Albumin: 2.6->2.2, BG:136,  20.6 Meds: Lasix  Recent Labs  Lab 10/10/18 1300 10/11/18 0327  NA 141 138  K 4.2 4.1  CL 107 107  CO2 25 25  BUN 17 17  CREATININE 0.94 0.80  CALCIUM 8.6* 8.2*  GLUCOSE 136* 119*   NUTRITION - FOCUSED PHYSICAL EXAM: Unable to conduct  Diet Order:   Diet Order            Diet NPO time specified  Diet effective midnight        Diet Heart Room service appropriate? Yes; Fluid consistency: Thin  Diet effective now             EDUCATION NEEDS:  Not appropriate for education at this time  Skin:  PU stage II to sacrum  Last BM:  5/21  Height:  Ht Readings from Last 1 Encounters:  10/10/18 5\' 11"  (1.803 m)   Weight:  Wt Readings from Last 1 Encounters:  10/10/18 63.6 kg   Wt Readings from Last 10 Encounters:  10/10/18 63.6 kg  03/25/18 65.8 kg  10/20/17 65.8 kg   Ideal Body Weight:  78.18 kg  BMI:  Body mass index is 19.56 kg/m.  Estimated Nutritional Needs:  Kcal:  1700-1900 (27-30 kcal/kg bw) Protein:  83-95g Pro (1.3-1.5g/kg bw) Fluid:  1.6-1.9 L (25-30 mls/kg bw)  Burtis Junes RD, LDN, CNSC Clinical Nutrition Available Tues-Sat via Pager: [phone] 10/11/2018 2:28 PM

## 2018-10-12 ENCOUNTER — Encounter (HOSPITAL_COMMUNITY): Payer: Self-pay | Admitting: *Deleted

## 2018-10-12 ENCOUNTER — Inpatient Hospital Stay (HOSPITAL_COMMUNITY): Payer: Medicare Other | Admitting: Registered Nurse

## 2018-10-12 ENCOUNTER — Inpatient Hospital Stay (HOSPITAL_COMMUNITY): Payer: Medicare Other

## 2018-10-12 ENCOUNTER — Encounter (HOSPITAL_COMMUNITY)
Admission: EM | Disposition: A | Discharge status: Skilled Nursing Facility | Payer: Self-pay | Source: Skilled Nursing Facility | Attending: Family Medicine

## 2018-10-12 DIAGNOSIS — D649 Anemia, unspecified: Secondary | ICD-10-CM

## 2018-10-12 DIAGNOSIS — F0390 Unspecified dementia without behavioral disturbance: Secondary | ICD-10-CM

## 2018-10-12 HISTORY — PX: INTRAMEDULLARY (IM) NAIL INTERTROCHANTERIC: SHX5875

## 2018-10-12 LAB — CBC WITH DIFFERENTIAL/PLATELET
Abs Immature Granulocytes: 0.2 10*3/uL — ABNORMAL HIGH (ref 0.00–0.07)
Basophils Absolute: 0 10*3/uL (ref 0.0–0.1)
Basophils Relative: 0 %
Eosinophils Absolute: 0.2 10*3/uL (ref 0.0–0.5)
Eosinophils Relative: 1 %
HCT: 30.1 % — ABNORMAL LOW (ref 39.0–52.0)
Hemoglobin: 9.4 g/dL — ABNORMAL LOW (ref 13.0–17.0)
Immature Granulocytes: 1 %
Lymphocytes Relative: 19 %
Lymphs Abs: 3.9 10*3/uL (ref 0.7–4.0)
MCH: 32.1 pg (ref 26.0–34.0)
MCHC: 31.2 g/dL (ref 30.0–36.0)
MCV: 102.7 fL — ABNORMAL HIGH (ref 80.0–100.0)
Monocytes Absolute: 0.7 10*3/uL (ref 0.1–1.0)
Monocytes Relative: 4 %
Neutro Abs: 15.5 10*3/uL — ABNORMAL HIGH (ref 1.7–7.7)
Neutrophils Relative %: 75 %
Platelets: 214 10*3/uL (ref 150–400)
RBC: 2.93 MIL/uL — ABNORMAL LOW (ref 4.22–5.81)
RDW: 15.3 % (ref 11.5–15.5)
WBC: 20.5 10*3/uL — ABNORMAL HIGH (ref 4.0–10.5)
nRBC: 0 % (ref 0.0–0.2)

## 2018-10-12 LAB — BASIC METABOLIC PANEL
Anion gap: 8 (ref 5–15)
BUN: 29 mg/dL — ABNORMAL HIGH (ref 8–23)
CO2: 26 mmol/L (ref 22–32)
Calcium: 8.2 mg/dL — ABNORMAL LOW (ref 8.9–10.3)
Chloride: 104 mmol/L (ref 98–111)
Creatinine, Ser: 0.98 mg/dL (ref 0.61–1.24)
GFR calc Af Amer: >60 mL/min (ref >60)
GFR calc non Af Amer: >60 mL/min (ref >60)
Glucose, Bld: 179 mg/dL — ABNORMAL HIGH (ref 70–99)
Potassium: 4.1 mmol/L (ref 3.5–5.1)
Sodium: 138 mmol/L (ref 135–145)

## 2018-10-12 LAB — CBC
HCT: 26.5 % — ABNORMAL LOW (ref 39.0–52.0)
Hemoglobin: 8.4 g/dL — ABNORMAL LOW (ref 13.0–17.0)
MCH: 32.1 pg (ref 26.0–34.0)
MCHC: 31.7 g/dL (ref 30.0–36.0)
MCV: 101.1 fL — ABNORMAL HIGH (ref 80.0–100.0)
Platelets: 218 10*3/uL (ref 150–400)
RBC: 2.62 MIL/uL — ABNORMAL LOW (ref 4.22–5.81)
RDW: 15.4 % (ref 11.5–15.5)
WBC: 17.6 10*3/uL — ABNORMAL HIGH (ref 4.0–10.5)
nRBC: 0 % (ref 0.0–0.2)

## 2018-10-12 SURGERY — FIXATION, FRACTURE, INTERTROCHANTERIC, WITH INTRAMEDULLARY ROD
Anesthesia: General | Laterality: Right

## 2018-10-12 MED ORDER — LIDOCAINE 2% (20 MG/ML) 5 ML SYRINGE
INTRAMUSCULAR | Status: DC | PRN
  Administered 2018-10-12: 40 mg via INTRAVENOUS

## 2018-10-12 MED ORDER — FENTANYL CITRATE (PF) 250 MCG/5ML IJ SOLN
INTRAMUSCULAR | Status: DC | PRN
  Administered 2018-10-12 (×2): 50 ug via INTRAVENOUS

## 2018-10-12 MED ORDER — PROPOFOL 10 MG/ML IV BOLUS
INTRAVENOUS | Status: DC | PRN
  Administered 2018-10-12: 20 mg via INTRAVENOUS
  Administered 2018-10-12: 50 mg via INTRAVENOUS

## 2018-10-12 MED ORDER — DOCUSATE SODIUM 100 MG PO CAPS
100.0000 mg | ORAL_CAPSULE | Freq: Two times a day (BID) | ORAL | Status: DC
  Administered 2018-10-12 – 2018-10-21 (×16): 100 mg via ORAL
  Filled 2018-10-12 (×17): qty 1

## 2018-10-12 MED ORDER — SUGAMMADEX SODIUM 200 MG/2ML IV SOLN
INTRAVENOUS | Status: DC | PRN
  Administered 2018-10-12: 200 mg via INTRAVENOUS

## 2018-10-12 MED ORDER — PHENYLEPHRINE HCL-NACL 10-0.9 MG/250ML-% IV SOLN
INTRAVENOUS | Status: AC
  Filled 2018-10-12: qty 250

## 2018-10-12 MED ORDER — PHENYLEPHRINE HCL (PRESSORS) 10 MG/ML IV SOLN
INTRAVENOUS | Status: DC | PRN
  Administered 2018-10-12: 40 ug via INTRAVENOUS

## 2018-10-12 MED ORDER — CEFAZOLIN SODIUM 1 G IJ SOLR
INTRAMUSCULAR | Status: AC
  Filled 2018-10-12: qty 20

## 2018-10-12 MED ORDER — ENSURE PRE-SURGERY PO LIQD
296.0000 mL | Freq: Once | ORAL | Status: AC
  Administered 2018-10-12: 296 mL via ORAL
  Filled 2018-10-12: qty 296

## 2018-10-12 MED ORDER — DEXAMETHASONE SODIUM PHOSPHATE 10 MG/ML IJ SOLN
INTRAMUSCULAR | Status: DC | PRN
  Administered 2018-10-12: 4 mg via INTRAVENOUS

## 2018-10-12 MED ORDER — PHENYLEPHRINE 40 MCG/ML (10ML) SYRINGE FOR IV PUSH (FOR BLOOD PRESSURE SUPPORT)
PREFILLED_SYRINGE | INTRAVENOUS | Status: AC
  Filled 2018-10-12: qty 10

## 2018-10-12 MED ORDER — ROCURONIUM BROMIDE 10 MG/ML (PF) SYRINGE
PREFILLED_SYRINGE | INTRAVENOUS | Status: AC
  Filled 2018-10-12: qty 10

## 2018-10-12 MED ORDER — FENTANYL CITRATE (PF) 250 MCG/5ML IJ SOLN
INTRAMUSCULAR | Status: AC
  Filled 2018-10-12: qty 5

## 2018-10-12 MED ORDER — ONDANSETRON HCL 4 MG/2ML IJ SOLN
INTRAMUSCULAR | Status: AC
  Filled 2018-10-12: qty 2

## 2018-10-12 MED ORDER — STERILE WATER FOR INJECTION IJ SOLN
INTRAMUSCULAR | Status: DC | PRN
  Administered 2018-10-12: 1000 mL

## 2018-10-12 MED ORDER — 0.9 % SODIUM CHLORIDE (POUR BTL) OPTIME
TOPICAL | Status: DC | PRN
  Administered 2018-10-12: 1000 mL

## 2018-10-12 MED ORDER — ONDANSETRON HCL 4 MG PO TABS: 4.0000 mg | ORAL_TABLET | Freq: Four times a day (QID) | ORAL | Status: DC | PRN

## 2018-10-12 MED ORDER — METOCLOPRAMIDE HCL 5 MG PO TABS: 5.0000 mg | ORAL_TABLET | Freq: Three times a day (TID) | ORAL | Status: DC | PRN

## 2018-10-12 MED ORDER — LIDOCAINE 2% (20 MG/ML) 5 ML SYRINGE
INTRAMUSCULAR | Status: AC
  Filled 2018-10-12: qty 5

## 2018-10-12 MED ORDER — ONDANSETRON HCL 4 MG/2ML IJ SOLN: 4.0000 mg | Freq: Four times a day (QID) | INTRAMUSCULAR | Status: DC | PRN

## 2018-10-12 MED ORDER — LACTATED RINGERS IV SOLN
INTRAVENOUS | Status: DC | PRN
  Administered 2018-10-12: 11:00:00 via INTRAVENOUS

## 2018-10-12 MED ORDER — ROCURONIUM BROMIDE 10 MG/ML (PF) SYRINGE
PREFILLED_SYRINGE | INTRAVENOUS | Status: DC | PRN
  Administered 2018-10-12: 50 mg via INTRAVENOUS

## 2018-10-12 MED ORDER — PROPOFOL 10 MG/ML IV BOLUS
INTRAVENOUS | Status: AC
  Filled 2018-10-12: qty 20

## 2018-10-12 MED ORDER — DEXAMETHASONE SODIUM PHOSPHATE 10 MG/ML IJ SOLN
INTRAMUSCULAR | Status: AC
  Filled 2018-10-12: qty 1

## 2018-10-12 MED ORDER — CEFAZOLIN SODIUM-DEXTROSE 2-3 GM-%(50ML) IV SOLR
INTRAVENOUS | Status: DC | PRN
  Administered 2018-10-12: 2 g via INTRAVENOUS

## 2018-10-12 MED ORDER — ONDANSETRON HCL 4 MG/2ML IJ SOLN
INTRAMUSCULAR | Status: DC | PRN
  Administered 2018-10-12: 4 mg via INTRAVENOUS

## 2018-10-12 MED ORDER — METOCLOPRAMIDE HCL 5 MG/ML IJ SOLN: 5.0000 mg | Freq: Three times a day (TID) | INTRAMUSCULAR | Status: DC | PRN

## 2018-10-12 MED ORDER — CEFAZOLIN SODIUM-DEXTROSE 1-4 GM/50ML-% IV SOLN
1.0000 g | Freq: Four times a day (QID) | INTRAVENOUS | Status: AC
  Administered 2018-10-12 – 2018-10-13 (×3): 1 g via INTRAVENOUS
  Filled 2018-10-12 (×3): qty 50

## 2018-10-12 SURGICAL SUPPLY — 40 items
ALCOHOL 70% 16 OZ (MISCELLANEOUS) ×3 IMPLANT
BIT DRILL FLUTED FEMUR 4.2/3 (BIT) ×3 IMPLANT
BNDG COHESIVE 6X5 TAN STRL LF (GAUZE/BANDAGES/DRESSINGS) ×6 IMPLANT
CANISTER SUCTION WELLS/JOHNSON (MISCELLANEOUS) ×3 IMPLANT
COVER PERINEAL POST (MISCELLANEOUS) ×3 IMPLANT
COVER SURGICAL LIGHT HANDLE (MISCELLANEOUS) ×3 IMPLANT
COVER WAND RF STERILE (DRAPES) ×3 IMPLANT
DRAPE HALF SHEET 40X57 (DRAPES) IMPLANT
DRAPE INCISE IOBAN 66X45 STRL (DRAPES) ×3 IMPLANT
DRAPE STERI IOBAN 125X83 (DRAPES) ×3 IMPLANT
DRSG ADAPTIC 3X8 NADH LF (GAUZE/BANDAGES/DRESSINGS) ×3 IMPLANT
DRSG EMULSION OIL 3X3 NADH (GAUZE/BANDAGES/DRESSINGS) ×3 IMPLANT
DURAPREP 26ML APPLICATOR (WOUND CARE) ×3 IMPLANT
ELECT CAUTERY BLADE 6.4 (BLADE) ×3 IMPLANT
ELECT REM PT RETURN 9FT ADLT (ELECTROSURGICAL) ×3
ELECTRODE REM PT RTRN 9FT ADLT (ELECTROSURGICAL) ×1 IMPLANT
GAUZE SPONGE 4X4 12PLY STRL (GAUZE/BANDAGES/DRESSINGS) ×3 IMPLANT
GAUZE SPONGE 4X4 12PLY STRL LF (GAUZE/BANDAGES/DRESSINGS) ×3 IMPLANT
GLOVE BIO SURGEON STRL SZ7.5 (GLOVE) ×3 IMPLANT
GLOVE BIOGEL PI IND STRL 8 (GLOVE) ×1 IMPLANT
GLOVE BIOGEL PI INDICATOR 8 (GLOVE) ×2
GOWN STRL REUS W/ TWL LRG LVL3 (GOWN DISPOSABLE) ×1 IMPLANT
GOWN STRL REUS W/ TWL XL LVL3 (GOWN DISPOSABLE) ×1 IMPLANT
GOWN STRL REUS W/TWL LRG LVL3 (GOWN DISPOSABLE) ×2
GOWN STRL REUS W/TWL XL LVL3 (GOWN DISPOSABLE) ×2
GUIDEWIRE 3.2X400 (WIRE) ×6 IMPLANT
KIT BASIN OR (CUSTOM PROCEDURE TRAY) ×3 IMPLANT
KIT TURNOVER KIT B (KITS) ×3 IMPLANT
NAIL TROCH FIX 10X235 RT 130 (Nail) ×3 IMPLANT
NS IRRIG 1000ML POUR BTL (IV SOLUTION) ×3 IMPLANT
PACK GENERAL/GYN (CUSTOM PROCEDURE TRAY) ×3 IMPLANT
PAD ARMBOARD 7.5X6 YLW CONV (MISCELLANEOUS) ×6 IMPLANT
SCREW LOCK T25 FT 36X5X4.3X (Screw) ×1 IMPLANT
SCREW LOCKING 5.0X36MM (Screw) ×2 IMPLANT
SCREW TFNA P5MM STERILE (Screw) ×3 IMPLANT
STAPLER VISISTAT 35W (STAPLE) ×3 IMPLANT
SUT MON AB 2-0 CT1 36 (SUTURE) ×3 IMPLANT
TOWEL OR 17X24 6PK STRL BLUE (TOWEL DISPOSABLE) ×3 IMPLANT
TOWEL OR 17X26 10 PK STRL BLUE (TOWEL DISPOSABLE) ×3 IMPLANT
WATER STERILE IRR 1000ML POUR (IV SOLUTION) ×3 IMPLANT

## 2018-10-12 NOTE — Progress Notes (Addendum)
Family Medicine Teaching Service Daily Progress Note Intern Pager: [phone]  Patient name: Maurice Mckee Medical record number: [id number] Date of birth: 1925/09/10 Age: 85 y.o. Gender: male  Primary Care Provider: Patient, No Pcp Per Consultants: further Code Status: Full   Pt Overview and Major Events to Date:  5/22 admitted for R hip fracture  Assessment and Plan: Maurice Mckee is a 85 y.o. male presenting from SNF after right hip fracture. PMH is significant for left hip fracture s/p repair on 09/04/18 (SNF at [facility]), recent h/o PNA in April, severe dementia, CHF (EF 45-50% on 04/13/18) , HTN,  HLD, and vitamin D deficiency  Right femoral fracture  H/o Multiple Falls: Undergoing intramedullary implant procedure to repair broken right femur.  Weightbearing as tolerated, return in 2 weeks for staple removal, resume antiplatelet regimen, start DVT prophylaxis on 5/25. -Q4 vital signs after terms of 4 -Ortho following appreciate recommendations -Per op note, patient weightbearing as tolerated -PT/OT -We will get postop CBC to evaluate risk for anemia -Immediately postop diet and pain control per surgery -Can restart Lovenox and antiplatelet 5/25  Leukocytosis: WBC 20.6 on admission, now 17.6.  Afebrile, chest x-ray clear.  Likely secondary to trauma.  High risk for postop pneumonia, will need to monitor closely -Daily CBC -Monitor for any signs and symptoms of infection such as fever or leukocytosis  Anemia: Hgb 8.4, MCV 101. Baseline 12-13. Home meds: iron supplement. Iron 16, sat 9, ferritin 393, vitb12 243 - will get post-op cbc - monitor with daily cbc - restart home iron supplement when no longer NPO  HFrEF (45-50%)  Pacemaker: By all account has been dry during admission. Could potentially need diuretic post-operatively pending how much fluid he receives during surgery. - continue home Coreg 12.5mg  BID - can restart asa, plavix on 5/25 - EKG pending - hold  home torsemide  Advanced Dementia: Home meds: Aricept and Namenda - hold home meds, may stop during this hospital stay given patient likely not benefiting from these medications  Severe Protein calorie malnutrition: Albumin 2.6. Cachetic appearing on exam.  - nutrition consulted  FEN/GI: advance diet as tolerated Prophylaxis: SCDs  Disposition: pending clinical course  Subjective:  Did well overnight, IM nail procedure this am  Objective: Temp:  [98.3 F (36.8 C)-99.1 F (37.3 C)] 98.3 F (36.8 C) (05/24 1204) Pulse Rate:  [76-93] 80 (05/24 1218) Resp:  [13-18] 15 (05/24 1218) BP: (110-139)/(56-76) 139/76 (05/24 1218) SpO2:  [92 %-99 %] 93 % (05/24 1218) Physical Exam: Unable to examine patient this am due to surgery. Will re-evaluate after returns to floor post-operatively  Laboratory: Recent Labs  Lab 10/10/18 1300 10/11/18 0327 10/12/18 0317  WBC 20.6* 17.8* 17.6*  HGB 9.9* 9.0* 8.4*  HCT 31.9* 28.0* 26.5*  PLT 223 208 218   Recent Labs  Lab 10/10/18 1300 10/11/18 0327 10/12/18 0317  NA 141 138 138  K 4.2 4.1 4.1  CL 107 107 104  CO2 25 25 26   BUN 17 17 29*  CREATININE 0.94 0.80 0.98  CALCIUM 8.6* 8.2* 8.2*  PROT 5.4* 4.7*  --   BILITOT 0.7 0.8  --   ALKPHOS 126 116  --   ALT 19 18  --   AST 18 15  --   GLUCOSE 136* 119* 179*    TSH 3.805  EKG NSR  Iron/TIBC/Ferritin/ %Sat    Component Value Date/Time   IRON 16 (L) 10/11/2018 0327   TIBC 176 (L) 10/11/2018 0327   FERRITIN 393 (  H) 10/11/2018 0327   IRONPCTSAT 9 (L) 10/11/2018 0327     Imaging/Diagnostic Tests: Dg C-arm 1-60 Min  Result Date: 10/12/2018 CLINICAL DATA:  Open reduction internal fixation for fracture EXAM: OPERATIVE RIGHT HIP  2 VIEWS TECHNIQUE: Fluoroscopic spot image(s) were submitted for interpretation post-operatively. COMPARISON:  Preoperative right hip region radiographs Oct 10, 2018 FLUOROSCOPY TIME:  0 minutes 53 seconds; 6 acquired images FINDINGS: Frontal and  lateral views obtained. There is screw and nail fixation through a fracture of the intertrochanteric portion of the proximal right femur. The tip of the screws in the proximal femoral head. There is avulsion of the lesser trochanter, unchanged from preoperative study. No dislocation. Right hip joint appears intact. IMPRESSION: Screw and nail fixation through comminuted fracture of the proximal right intertrochanteric region. Persistent avulsion lesser trochanter. No dislocation. Tip of screw in proximal femoral head. Electronically Signed   By: Lowella Grip III M.D.   On: 10/12/2018 12:16   Dg Hip Operative Unilat W Or W/o Pelvis Right  Result Date: 10/12/2018 CLINICAL DATA:  Open reduction internal fixation for fracture EXAM: OPERATIVE RIGHT HIP  2 VIEWS TECHNIQUE: Fluoroscopic spot image(s) were submitted for interpretation post-operatively. COMPARISON:  Preoperative right hip region radiographs Oct 10, 2018 FLUOROSCOPY TIME:  0 minutes 53 seconds; 6 acquired images FINDINGS: Frontal and lateral views obtained. There is screw and nail fixation through a fracture of the intertrochanteric portion of the proximal right femur. The tip of the screws in the proximal femoral head. There is avulsion of the lesser trochanter, unchanged from preoperative study. No dislocation. Right hip joint appears intact. IMPRESSION: Screw and nail fixation through comminuted fracture of the proximal right intertrochanteric region. Persistent avulsion lesser trochanter. No dislocation. Tip of screw in proximal femoral head. Electronically Signed   By: Lowella Grip III M.D.   On: 10/12/2018 12:16    Guadalupe Dawn, MD 10/12/2018, 12:24 PM PGY-2, [place] Intern pager: [phone], text pages welcome

## 2018-10-12 NOTE — Transfer of Care (Signed)
Immediate Anesthesia Transfer of Care Note  Patient: Maurice Mckee  Procedure(s) Performed: INTRAMEDULLARY (IM) NAIL INTERTROCHANTRIC (Right )  Patient Location: PACU  Anesthesia Type:General  Level of Consciousness: drowsy and responds to stimulation  Airway & Oxygen Therapy: Patient Spontanous Breathing and Patient connected to face mask oxygen  Post-op Assessment: Report given to RN and Post -op Vital signs reviewed and stable  Post vital signs: Reviewed and stable  Last Vitals:  Vitals Value Taken Time  BP 129/68   Temp    Pulse 76 10/12/2018 12:04 PM  Resp 13 10/12/2018 12:04 PM  SpO2 99 % 10/12/2018 12:04 PM  Vitals shown include unvalidated device data.  Last Pain:  Vitals:   10/12/18 0850  TempSrc:   PainSc: Asleep      Patients Stated Pain Goal: 1(pt is confused, nurses goal) (31/67/42 5525)  Complications: No apparent anesthesia complications

## 2018-10-12 NOTE — Progress Notes (Signed)
  Palliative medicine progress note  Patient underwent surgery for right hip fracture today.  He is still in PACU.  Per chart review he has had minimal PRN medications for pain.  I did call his son Thurlow Gallaga at [phone].  He did receive my email containing attachments of hard choices for loving People as well as a most form.  He said he did review that with his 5 siblings and at this point most of his siblings he reported "want to continue all options", "if he ends up on a ventilator will make  decisions then".  He would like to video chat with his father on 10/13/2018 or this evening if this can be arranged.  I will reach out to [facility] staff to see if they can assist with this when he returns from PACU  Patient Profile: 85 y.o. male  with past medical history of severe dementia, systolic heart failure with an EF of 45 to 50%, hypertension, hyperlipidemia, vitamin D deficiency, cardiomyopathy, active ICD, left hip fracture with repair September 04, 2018 admitted on 10/10/2018 after sustaining a fall at rehab facility.  Patient was found to have fractured his right hip  Consult ordered for goals of care as well as assisting family in making decisions regarding surgery  On 10/12/2018, patient underwent repair of right hip fracture.  Per operative note his surgery went well.  At the time of this note, he is currently in PACU  Plan  Palliative medicine to continue support patient and family.  I emailed patient's son/healthcare power of attorney, Francee Piccolo, Hard Choices for [employer] booklet as well as MOST form.  He states he reviewed this with his 5 siblings and they are of the mind to cont full scope treatment including intubation if it were required.  He states "we will make further decisions then"  His chief concern is getting his father home with home health.  Thank you, Romona Curls, NP Total time: 15 minutes Greater than 50% of time was spent in counseling and coordination of care

## 2018-10-12 NOTE — Progress Notes (Signed)
Family Medicine Teaching Service Daily Progress Note Intern Pager: [phone]  Patient name: Maurice Mckee Medical record number: [id number] Date of birth: 1926/01/25 Age: 85 y.o. Gender: male  Primary Care Provider: Patient, No Pcp Per Consultants: further Code Status: Full   Pt Overview and Major Events to Date:  5/22 admitted for R hip fracture 5/24 - IM fixiation of right femur  Assessment and Plan: Maurice Mckee is a 85 y.o. male presenting from SNF after right hip fracture. PMH is significant for left hip fracture s/p repair on 09/04/18 (SNF at [facility]), recent h/o PNA in April, severe dementia, CHF (EF 45-50% on 04/13/18) , HTN,  HLD, and vitamin D deficiency  Right femoral fracture s/p IM  H/o Multiple Falls: Per Orthopedics [facility], return in 2 weeks for staple removal, resume antiplatelet regimen, start DVT prophylaxis on 5/25. Given low Hg will restart antiplatelet first + SCD then restart DVT prophylaxis on 5/26 -Ortho following appreciate recommendations -PT/OT  Leukocytosis improving: WBC 20 > 17.  Afebrile. Likely secondary to trauma.   -Daily CBC -Monitor for any signs and symptoms of infection such as fever or leukocytosis  ABLA: Hgb 9.4 >8.1. BL 12-13. Likely due to surgery.  - daily CBC - cont home iron  HFrEF (45-50%)  Pacemaker: By all account has been dry during admission. Could potentially need diuretic post-operatively pending how much fluid he receives during surgery. - continue home Coreg 12.5mg  BID - restart asa, plavix on 5/25 - restart torsemide  Advanced Dementia: Home meds: Aricept and Namenda - hold home meds, may stop during this hospital stay given patient likely not benefiting from these medications  Severe Protein calorie malnutrition: Albumin 2.6. Cachetic appearing on exam.  - nutrition consulted  FEN/GI: ADAT Prophylaxis: SCDs  Disposition: pending clinical course  Subjective:  No acute events  overnight.  Objective: Temp:  [97.5 F (36.4 C)-99.1 F (37.3 C)] 98.4 F (36.9 C) (05/24 2213) Pulse Rate:  [75-88] 76 (05/24 2251) Resp:  [11-18] 18 (05/24 2213) BP: (98-158)/(56-127) 98/57 (05/24 2251) SpO2:  [91 %-99 %] 93 % (05/24 2213) Physical Exam: General: Awake, NAD,  Cards: Regular rate rhythm, no M/R/G.  Lungs: Lungs clear to auscultation bilaterally, no accessory muscle use, no distress Right hip:  Incision site clean dry and intact with dressing overlying.   Neuro: No evidence of focal neurologic deficit  Laboratory: Recent Labs  Lab 10/11/18 0327 10/12/18 0317 10/12/18 1617  WBC 17.8* 17.6* 20.5*  HGB 9.0* 8.4* 9.4*  HCT 28.0* 26.5* 30.1*  PLT 208 218 214   Recent Labs  Lab 10/10/18 1300 10/11/18 0327 10/12/18 0317  NA 141 138 138  K 4.2 4.1 4.1  CL 107 107 104  CO2 25 25 26   BUN 17 17 29*  CREATININE 0.94 0.80 0.98  CALCIUM 8.6* 8.2* 8.2*  PROT 5.4* 4.7*  --   BILITOT 0.7 0.8  --   ALKPHOS 126 116  --   ALT 19 18  --   AST 18 15  --   GLUCOSE 136* 119* 179*    TSH 3.805  EKG NSR  Iron/TIBC/Ferritin/ %Sat    Component Value Date/Time   IRON 16 (L) 10/11/2018 0327   TIBC 176 (L) 10/11/2018 0327   FERRITIN 393 (H) 10/11/2018 0327   IRONPCTSAT 9 (L) 10/11/2018 0327     Imaging/Diagnostic Tests: Dg C-arm 1-60 Min  Result Date: 10/12/2018 CLINICAL DATA:  Open reduction internal fixation for fracture EXAM: OPERATIVE RIGHT HIP  2 VIEWS TECHNIQUE: Fluoroscopic  spot image(s) were submitted for interpretation post-operatively. COMPARISON:  Preoperative right hip region radiographs Oct 10, 2018 FLUOROSCOPY TIME:  0 minutes 53 seconds; 6 acquired images FINDINGS: Frontal and lateral views obtained. There is screw and nail fixation through a fracture of the intertrochanteric portion of the proximal right femur. The tip of the screws in the proximal femoral head. There is avulsion of the lesser trochanter, unchanged from preoperative study. No  dislocation. Right hip joint appears intact. IMPRESSION: Screw and nail fixation through comminuted fracture of the proximal right intertrochanteric region. Persistent avulsion lesser trochanter. No dislocation. Tip of screw in proximal femoral head. Electronically Signed   By: Lowella Grip III M.D.   On: 10/12/2018 12:16   Dg Hip Operative Unilat W Or W/o Pelvis Right  Result Date: 10/12/2018 CLINICAL DATA:  Open reduction internal fixation for fracture EXAM: OPERATIVE RIGHT HIP  2 VIEWS TECHNIQUE: Fluoroscopic spot image(s) were submitted for interpretation post-operatively. COMPARISON:  Preoperative right hip region radiographs Oct 10, 2018 FLUOROSCOPY TIME:  0 minutes 53 seconds; 6 acquired images FINDINGS: Frontal and lateral views obtained. There is screw and nail fixation through a fracture of the intertrochanteric portion of the proximal right femur. The tip of the screws in the proximal femoral head. There is avulsion of the lesser trochanter, unchanged from preoperative study. No dislocation. Right hip joint appears intact. IMPRESSION: Screw and nail fixation through comminuted fracture of the proximal right intertrochanteric region. Persistent avulsion lesser trochanter. No dislocation. Tip of screw in proximal femoral head. Electronically Signed   By: Lowella Grip III M.D.   On: 10/12/2018 12:16    Bonnita Hollow, MD 10/12/2018, 11:13 PM PGY-2, [place] Intern pager: [phone], text pages welcome

## 2018-10-12 NOTE — Brief Op Note (Signed)
10/12/2018  11:42 AM  PATIENT:  Maurice Mckee  85 y.o. male  PRE-OPERATIVE DIAGNOSIS:  right hip fracture  POST-OPERATIVE DIAGNOSIS:  * No post-op diagnosis entered *  PROCEDURE:  Procedure(s): INTRAMEDULLARY (IM) NAIL INTERTROCHANTRIC (Right)  SURGEON:  Surgeon(s) and Role:    * Nicholes Stairs, MD - Primary  PHYSICIAN ASSISTANT:   ASSISTANTS: none   ANESTHESIA:   general  EBL:  100 mL   BLOOD ADMINISTERED:none  DRAINS: none   LOCAL MEDICATIONS USED:  NONE  SPECIMEN:  No Specimen  DISPOSITION OF SPECIMEN:  N/A  COUNTS:  YES  TOURNIQUET:  * No tourniquets in log *  DICTATION: .Note written in EPIC  PLAN OF CARE: Admit to inpatient   PATIENT DISPOSITION:  PACU - hemodynamically stable.   Delay start of Pharmacological VTE agent (>24hrs) due to surgical blood loss or risk of bleeding: not applicable

## 2018-10-12 NOTE — Op Note (Signed)
Date of Surgery: 10/12/2018  INDICATIONS: Mr. Maurice Mckee is a 85 y.o.-year-old male who sustained a right hip fracture. The risks and benefits of the procedure discussed with the family and his son who is his [place] prior to the procedure and all questions were answered; consent was obtained.  PREOPERATIVE DIAGNOSIS: right hip fracture   POSTOPERATIVE DIAGNOSIS: Same   PROCEDURE: Treatment of intertrochanteric, pertrochanteric, subtrochanteric fracture with intramedullary implant. CPT [phone]   SURGEON: Dannielle Karvonen. Stann Mainland, M.D.   ANESTHESIA: general   IV FLUIDS AND URINE: See anesthesia record   ESTIMATED BLOOD LOSS: 100 cc  IMPLANTS:  Synthes TFNA  10 mm x 235 mm  proximal 100 mm compression screw Distal 36 x 5.0 mm interlocking screw  DRAINS: None.   COMPLICATIONS: None.   DESCRIPTION OF PROCEDURE: The patient was brought to the operating room and placed supine on the operating table. The patient's leg had been signed prior to the procedure. The patient had the anesthesia placed by the anesthesiologist. The prep verification and incision time-outs were performed to confirm that this was the correct patient, site, side and location. The patient had an SCD on the opposite lower extremity. The patient did receive antibiotics prior to the incision and was re-dosed during the procedure as needed at indicated intervals. The patient was positioned on the fracture table with the table in traction and internal rotation to reduce the hip. The well leg was placed in a scissor position and all bony prominences were well-padded. The patient had the lower extremity prepped and draped in the standard surgical fashion. The incision was made 4 finger breadths superior to the greater trochanter. A guide pin was inserted into the tip of the greater trochanter under fluoroscopic guidance. An opening reamer was used to gain access to the femoral canal. The nail length was measured and inserted down the femoral  canal to its proper depth. The appropriate version of insertion for the lag screw was found under fluoroscopy. A pin was inserted up the femoral neck through the jig. The length of the lag screw was then measured. The lag screw was inserted as near to center-center in the head as possible. The leg was taken out of traction, then the compression screw was used to compress across the fracture. Compression was visualized on serial xrays.   We next turned our attention to the distal interlocking screw.  This was placed through the drill guide of the nail inserter.  A small incision was made overlying the lateral thigh at the screw site, and a tonsil was used to disect down to bone.  A drill pass was made through the jig and across the nail through both cortices.  This was measured, and the appropriate screw was placed under hand power and found to have good bite.    The wound was copiously irrigated with saline and the subcutaneous layer closed with 2.0 vicryl and the skin was reapproximated with staples. The wounds were cleaned and dried a final time and a sterile dressing was placed. The hip was taken through a range of motion at the end of the case under fluoroscopic imaging to visualize the approach-withdraw phenomenon and confirm implant length in the head. The patient was then awakened from anesthesia and taken to the recovery room in stable condition. All counts were correct at the end of the case.   POSTOPERATIVE PLAN: The patient will be weight bearing as tolerated and will return in 2 weeks for staple removal and  the patient will receive DVT prophylaxis based on other medications, activity level, and risk ratio of bleeding to thrombosis.   He will resume is antiplatelet regimen as before surgery.    Geralynn Rile, MD [facility] [phone] 11:44 AM

## 2018-10-12 NOTE — Progress Notes (Addendum)
Family medicine progress note addendum  Able to examine patient postoperatively.  Patient resting comfortably in no distress at bedside.  Mildly arousable but does drift off to sleep fairly quickly.  General: Sleeping well, reactive to pain, can follow commands Cards: Regular rate rhythm, no M/R/G.  Palpable PT DP bilaterally Lungs: Lungs clear to auscultation bilaterally, no accessory muscle use, no distress Orthopedic:  Right hip: Ice pack in place directly overlying incision sites.  Incision site clean dry and intact with dressing overlying.  No ecchymoses apparent on exam, hip joint soft with no evidence of hematoma formation Neuro: No evidence of focal neurologic deficit  We will recheck CBC at 1700.  If patient below transfusion threshold will give 1 unit.  Per orthopedic note weightbearing as tolerated, can restart antiplatelet and anticoagulant likely on 5/25.  Gave update to patient's son Hargis Vandyne via telephone number listed in chart.  Explained planned postoperative course and plan for the night.  Clarify that they would want blood transfusion if necessary.  Patient son appreciative for update, all questions answered.  Guadalupe Dawn MD PGY-2 Family Medicine Resident

## 2018-10-12 NOTE — Anesthesia Preprocedure Evaluation (Addendum)
Anesthesia Evaluation  Patient identified by MRN, date of birth, ID band Patient confused    Reviewed: Allergy & Precautions, NPO status , Patient's Chart, lab work & pertinent test results, reviewed documented beta blocker date and time   History of Anesthesia Complications Negative for: history of anesthetic complications  Airway Mallampati: II  TM Distance: <3 FB Neck ROM: Full    Dental  (+) Edentulous Upper, Edentulous Lower   Pulmonary COPD, neg recent URI,    breath sounds clear to auscultation       Cardiovascular hypertension, Pt. on medications and Pt. on home beta blockers + CAD and +CHF  + pacemaker + Cardiac Defibrillator  Rhythm:Regular     Neuro/Psych PSYCHIATRIC DISORDERS Dementia    GI/Hepatic negative GI ROS, Neg liver ROS,   Endo/Other  negative endocrine ROS  Renal/GU negative Renal ROS     Musculoskeletal  (+) Arthritis , right hip fracture   Abdominal   Peds  Hematology  (+) Blood dyscrasia, anemia ,   Anesthesia Other Findings   Reproductive/Obstetrics                            Anesthesia Physical Anesthesia Plan  ASA: III  Anesthesia Plan: General   Post-op Pain Management:    Induction: Intravenous  PONV Risk Score and Plan: 2 and Treatment may vary due to age or medical condition, Ondansetron and Dexamethasone  Airway Management Planned: Oral ETT  Additional Equipment: None  Intra-op Plan:   Post-operative Plan: Extubation in OR and Possible Post-op intubation/ventilation  Informed Consent:     History available from chart only  Plan Discussed with: CRNA and Surgeon  Anesthesia Plan Comments:        Anesthesia Quick Evaluation

## 2018-10-12 NOTE — Anesthesia Procedure Notes (Signed)
Procedure Name: Intubation Date/Time: 10/12/2018 10:41 AM Performed by: Jearld Pies, CRNA Pre-anesthesia Checklist: Patient identified, Emergency Drugs available, Suction available and Patient being monitored Patient Re-evaluated:Patient Re-evaluated prior to induction Oxygen Delivery Method: Circle System Utilized Preoxygenation: Pre-oxygenation with 100% oxygen Induction Type: IV induction Ventilation: Oral airway inserted - appropriate to patient size and Two handed mask ventilation required Laryngoscope Size: Miller and 3 Grade View: Grade I Tube type: Oral Tube size: 7.5 mm Number of attempts: 1 Airway Equipment and Method: Stylet and Oral airway Placement Confirmation: ETT inserted through vocal cords under direct vision,  positive ETCO2 and breath sounds checked- equal and bilateral Secured at: 22 cm Tube secured with: Tape Dental Injury: Teeth and Oropharynx as per pre-operative assessment

## 2018-10-13 ENCOUNTER — Inpatient Hospital Stay (HOSPITAL_COMMUNITY): Payer: Medicare Other

## 2018-10-13 DIAGNOSIS — D62 Acute posthemorrhagic anemia: Secondary | ICD-10-CM

## 2018-10-13 LAB — BASIC METABOLIC PANEL
Anion gap: 9 (ref 5–15)
BUN: 32 mg/dL — ABNORMAL HIGH (ref 8–23)
CO2: 26 mmol/L (ref 22–32)
Calcium: 8.3 mg/dL — ABNORMAL LOW (ref 8.9–10.3)
Chloride: 104 mmol/L (ref 98–111)
Creatinine, Ser: 0.93 mg/dL (ref 0.61–1.24)
GFR calc Af Amer: >60 mL/min (ref >60)
GFR calc non Af Amer: >60 mL/min (ref >60)
Glucose, Bld: 147 mg/dL — ABNORMAL HIGH (ref 70–99)
Potassium: 4.7 mmol/L (ref 3.5–5.1)
Sodium: 139 mmol/L (ref 135–145)

## 2018-10-13 LAB — CBC WITH DIFFERENTIAL/PLATELET
Abs Immature Granulocytes: 0.1 10*3/uL — ABNORMAL HIGH (ref 0.00–0.07)
Basophils Absolute: 0 10*3/uL (ref 0.0–0.1)
Basophils Relative: 0 %
Eosinophils Absolute: 0 10*3/uL (ref 0.0–0.5)
Eosinophils Relative: 0 %
HCT: 26 % — ABNORMAL LOW (ref 39.0–52.0)
Hemoglobin: 8.1 g/dL — ABNORMAL LOW (ref 13.0–17.0)
Immature Granulocytes: 1 %
Lymphocytes Relative: 19 %
Lymphs Abs: 3.2 10*3/uL (ref 0.7–4.0)
MCH: 31.9 pg (ref 26.0–34.0)
MCHC: 31.2 g/dL (ref 30.0–36.0)
MCV: 102.4 fL — ABNORMAL HIGH (ref 80.0–100.0)
Monocytes Absolute: 1.9 10*3/uL — ABNORMAL HIGH (ref 0.1–1.0)
Monocytes Relative: 11 %
Neutro Abs: 12.2 10*3/uL — ABNORMAL HIGH (ref 1.7–7.7)
Neutrophils Relative %: 69 %
Platelets: 227 10*3/uL (ref 150–400)
RBC: 2.54 MIL/uL — ABNORMAL LOW (ref 4.22–5.81)
RDW: 15 % (ref 11.5–15.5)
WBC: 17.5 10*3/uL — ABNORMAL HIGH (ref 4.0–10.5)
nRBC: 0 % (ref 0.0–0.2)

## 2018-10-13 MED ORDER — ACETAMINOPHEN 325 MG PO TABS
650.0000 mg | ORAL_TABLET | Freq: Four times a day (QID) | ORAL | Status: DC
  Administered 2018-10-13 – 2018-10-17 (×16): 650 mg via ORAL
  Filled 2018-10-13 (×15): qty 2

## 2018-10-13 MED ORDER — FENTANYL CITRATE (PF) 100 MCG/2ML IJ SOLN
INTRAMUSCULAR | Status: AC
  Filled 2018-10-13: qty 2

## 2018-10-13 MED ORDER — VITAMIN D 25 MCG (1000 UNIT) PO TABS
1000.0000 [IU] | ORAL_TABLET | Freq: Every day | ORAL | Status: DC
  Administered 2018-10-14 – 2018-10-21 (×8): 1000 [IU] via ORAL
  Filled 2018-10-13 (×9): qty 1

## 2018-10-13 MED ORDER — ASPIRIN 325 MG PO TABS: 325.0000 mg | ORAL_TABLET | Freq: Every day | ORAL | Status: DC

## 2018-10-13 MED ORDER — CALCIUM CARBONATE 1250 (500 CA) MG PO TABS
1.0000 | ORAL_TABLET | Freq: Two times a day (BID) | ORAL | Status: DC
  Administered 2018-10-13 – 2018-10-21 (×15): 500 mg via ORAL
  Filled 2018-10-13 (×15): qty 1

## 2018-10-13 MED ORDER — MIDAZOLAM HCL 2 MG/2ML IJ SOLN
INTRAMUSCULAR | Status: AC | PRN
  Administered 2018-10-13: 0.5 mg via INTRAVENOUS

## 2018-10-13 MED ORDER — OXYCODONE HCL 5 MG PO TABS: 5.0000 mg | ORAL_TABLET | ORAL | Status: DC | PRN

## 2018-10-13 MED ORDER — SODIUM CHLORIDE 0.9% FLUSH
5.0000 mL | Freq: Three times a day (TID) | INTRAVENOUS | Status: DC
  Administered 2018-10-13 – 2018-10-18 (×14): 5 mL
  Administered 2018-10-18: 3 mL
  Administered 2018-10-19 – 2018-10-20 (×3): 5 mL

## 2018-10-13 MED ORDER — ACETAMINOPHEN 650 MG RE SUPP: 650.0000 mg | Freq: Four times a day (QID) | RECTAL | Status: DC

## 2018-10-13 MED ORDER — SODIUM CHLORIDE 0.9 % IV SOLN
INTRAVENOUS | Status: AC | PRN
  Administered 2018-10-13: 10 mL/h via INTRAVENOUS

## 2018-10-13 MED ORDER — LIDOCAINE HCL 1 % IJ SOLN
INTRAMUSCULAR | Status: AC
  Filled 2018-10-13: qty 20

## 2018-10-13 MED ORDER — FENTANYL CITRATE (PF) 100 MCG/2ML IJ SOLN
INTRAMUSCULAR | Status: AC | PRN
  Administered 2018-10-13: 25 ug via INTRAVENOUS

## 2018-10-13 MED ORDER — ENOXAPARIN SODIUM 40 MG/0.4ML ~~LOC~~ SOLN
40.0000 mg | SUBCUTANEOUS | Status: DC
  Administered 2018-10-14 – 2018-10-21 (×8): 40 mg via SUBCUTANEOUS
  Filled 2018-10-13 (×8): qty 0.4

## 2018-10-13 MED ORDER — MIDAZOLAM HCL 2 MG/2ML IJ SOLN
INTRAMUSCULAR | Status: AC
  Filled 2018-10-13: qty 2

## 2018-10-13 MED ORDER — ENOXAPARIN SODIUM 40 MG/0.4ML ~~LOC~~ SOLN: 40.0000 mg | SUBCUTANEOUS | Status: DC

## 2018-10-13 NOTE — Evaluation (Signed)
Physical Therapy Evaluation Patient Details Name: Maurice Mckee MRN: [id number] DOB: 1925/09/22 Today's Date: 10/13/2018   History of Present Illness  85 yo male admitted from [facility] s/p fall for R IM nail PMH: L hemiarthroplasty 09/04/18 PNA Dementia  Clinical Impression  Patient admitted s/p above listed procedure. Patient prior resident of SNF, where he was receiving rehab services. Patient today requiring Mod A +2 for all bed mobility and transfers. Flexed posturing throughout session even with max cueing for posture. PT to follow acutely to maximize functional mobility. Will recommend d/c to SNF.      Follow Up Recommendations SNF;Supervision/Assistance - 24 hour    Equipment Recommendations  None recommended by PT    Recommendations for Other Services       Precautions / Restrictions Precautions Precautions: Fall Restrictions Weight Bearing Restrictions: Yes RLE Weight Bearing: Weight bearing as tolerated      Mobility  Bed Mobility Overal bed mobility: Needs Assistance Bed Mobility: Supine to Sit     Supine to sit: +2 for physical assistance;Mod assist     General bed mobility comments: pt requires (A) to exit the bed on L side with max (A) for bil LE. pt sittin eob min (A) initially and progressed to min guard  Transfers Overall transfer level: Needs assistance Equipment used: Rolling walker (2 wheeled) Transfers: Sit to/from [facility] Sit to Stand: +2 physical assistance;Mod assist;From elevated surface Stand pivot transfers: +2 physical assistance;Mod assist;From elevated surface       General transfer comment: pt requires elevated surface adn anterior weight shift to stand. pt very flexed posture and needs (A) to sustain anterior shift. pt pivot to chair on left side.   Ambulation/Gait             General Gait Details: deferred  Stairs            Wheelchair Mobility    Modified Rankin (Stroke Patients Only)        Balance Overall balance assessment: Needs assistance Sitting-balance support: Bilateral upper extremity supported;Feet supported Sitting balance-Leahy Scale: Fair     Standing balance support: Bilateral upper extremity supported;During functional activity Standing balance-Leahy Scale: Poor                               Pertinent Vitals/Pain Pain Assessment: Faces Faces Pain Scale: Hurts even more Pain Location: R leg Pain Descriptors / Indicators: Operative site guarding;Grimacing Pain Intervention(s): Limited activity within patient's tolerance;Monitored during session;Repositioned    Home Living Family/patient expects to be discharged to:: Skilled nursing facility                      Prior Function Level of Independence: Needs assistance      ADL's / Homemaking Assistance Needed: per chart pt was transfering at SNF - (A) at baseline with adls.         Hand Dominance   Dominant Hand: Right    Extremity/Trunk Assessment   Upper Extremity Assessment Upper Extremity Assessment: Defer to OT evaluation    Lower Extremity Assessment Lower Extremity Assessment: Generalized weakness;RLE deficits/detail;LLE deficits/detail RLE Deficits / Details: expected post-op pain and weakness; limited weight bearing; flexed posture LLE Deficits / Details: noted to have staples in area of hip sx 09/04/18    Cervical / Trunk Assessment Cervical / Trunk Assessment: Kyphotic  Communication   Communication: No difficulties  Cognition Arousal/Alertness: Awake/alert Behavior During Therapy: [facility]  for tasks assessed/performed Overall Cognitive Status: History of cognitive impairments - at baseline                                        General Comments General comments (skin integrity, edema, etc.): noted to have staples in L hip area from surgery 09/04/18. MD RN and hospitalist team notified via secure chat.     Exercises      Assessment/Plan    PT Assessment Patient needs continued PT services  PT Problem List Decreased strength;Decreased activity tolerance;Decreased balance;Decreased mobility;Decreased knowledge of use of DME;Decreased safety awareness       PT Treatment Interventions DME instruction;Gait training;Functional mobility training;Therapeutic activities;Therapeutic exercise;Neuromuscular re-education;Balance training;Patient/family education    PT Goals (Current goals can be found in the Care Plan section)  Acute Rehab PT Goals Patient Stated Goal: to watch yall do it  PT Goal Formulation: With patient Time For Goal Achievement: 10/27/18 Potential to Achieve Goals: Fair    Frequency Min 3X/week   Barriers to discharge        Co-evaluation PT/OT/SLP Co-Evaluation/Treatment: Yes Reason for Co-Treatment: Complexity of the patient's impairments (multi-system involvement);For patient/therapist safety;To address functional/ADL transfers PT goals addressed during session: Mobility/safety with mobility;Balance;Proper use of DME;Strengthening/ROM OT goals addressed during session: ADL's and self-care;Proper use of Adaptive equipment and DME;Strengthening/ROM       AM-PAC PT "6 Clicks" Mobility  Outcome Measure Help needed turning from your back to your side while in a flat bed without using bedrails?: A Lot Help needed moving from lying on your back to sitting on the side of a flat bed without using bedrails?: A Lot Help needed moving to and from a bed to a chair (including a wheelchair)?: A Lot Help needed standing up from a chair using your arms (e.g., wheelchair or bedside chair)?: A Lot Help needed to walk in hospital room?: Total Help needed climbing 3-5 steps with a railing? : Total 6 Click Score: 10    End of Session Equipment Utilized During Treatment: Gait belt;Oxygen Activity Tolerance: Patient tolerated treatment well Patient left: in chair;with call bell/phone within  reach;with chair alarm set Nurse Communication: Mobility status PT Visit Diagnosis: Unsteadiness on feet (R26.81);Other abnormalities of gait and mobility (R26.89);Muscle weakness (generalized) (M62.81)    Time: 6226-3335 PT Time Calculation (min) (ACUTE ONLY): 23 min   Charges:   PT Evaluation $PT Eval Moderate Complexity: 1 Mod           Lanney Gins, PT, DPT Supplemental Physical Therapist 10/13/18 3:30 PM Pager: [phone] Office: [phone]

## 2018-10-13 NOTE — Progress Notes (Signed)
   Subjective:  Patient reports pain as mild.  sleeping  Objective:   VITALS:   Vitals:   10/12/18 2213 10/12/18 2251 10/13/18 0107 10/13/18 0443  BP: (!) 99/58 (!) 98/57 (!) 100/56 (!) 113/59  Pulse: 80 76 73 81  Resp: 18  16 18   Temp: 98.4 F (36.9 C)  97.8 F (36.6 C) (!) 97.4 F (36.3 C)  TempSrc: Axillary  Axillary Axillary  SpO2: 93%  94% 92%  Weight:      Height:        Intact pulses distally Dorsiflexion/Plantar flexion intact Incision: dressing C/D/I Compartment soft   Lab Results  Component Value Date   WBC 17.5 (H) 10/13/2018   HGB 8.1 (L) 10/13/2018   HCT 26.0 (L) 10/13/2018   MCV 102.4 (H) 10/13/2018   PLT 227 10/13/2018   BMET    Component Value Date/Time   NA 139 10/13/2018 0314   NA 140 03/25/2018   K 4.7 10/13/2018 0314   CL 104 10/13/2018 0314   CO2 26 10/13/2018 0314   GLUCOSE 147 (H) 10/13/2018 0314   BUN 32 (H) 10/13/2018 0314   BUN 19 03/25/2018   CREATININE 0.93 10/13/2018 0314   CALCIUM 8.3 (L) 10/13/2018 0314   GFRNONAA >60 10/13/2018 0314   GFRAA >60 10/13/2018 0314     Assessment/Plan: 1 Day Post-Op   Active Problems:   Right hip pain   Goals of care, counseling/discussion   Hip fracture (HCC)   Pressure injury of skin   Palliative care by specialist   Up with therapy - WBAT to right leg - maintain bandages scds and lovenox for DVT ppx in house, at dc he may resume his asa and plavix as before surgery. - follow up in 2 weeks with Stann Mainland for routine post op care.    Nicholes Stairs 10/13/2018, 9:31 AM   Geralynn Rile, MD [phone]

## 2018-10-13 NOTE — Progress Notes (Signed)
OT NOTE  OT evaluation completed at this time. OT formal evaluation to follow. Pt currently +2 mod (A) stand pivot to chair with RW and recommendation for SNF. RN/ hospitalist and MD Stann Mainland notified of L hip staples which occurred in April surgery via secure chat for any appropriate staple removal orders.    Jeri Modena   OTR/L Pager: [phone] Office: [phone]

## 2018-10-13 NOTE — Evaluation (Signed)
Occupational Therapy Evaluation Patient Details Name: Maurice Mckee MRN: [id number] DOB: [birth year] Today's Date: 10/13/2018    History of Present Illness 85 yo male admitted from [facility] s/p fall for R IM nail PMH: L hemiarthroplasty 09/04/18 PNA Dementia   Clinical Impression   Patient is s/p R IM nail surgery resulting in functional limitations due to the deficits listed below (see OT problem list). Pt currently total +2 mod (A) for basic transfers and redirection. Pt pleasant and agreeable to oob. Pt should not remain in chair longer than 2 hours without repositioning due to risk for skin breakdown.  Patient will benefit from skilled OT acutely to increase independence and safety with ADLS to allow discharge SNF.     Follow Up Recommendations  SNF    Equipment Recommendations  3 in 1 bedside commode;Wheelchair (measurements OT);Wheelchair cushion (measurements OT)    Recommendations for Other Services       Precautions / Restrictions Precautions Precautions: Fall Restrictions Weight Bearing Restrictions: Yes RLE Weight Bearing: Weight bearing as tolerated      Mobility Bed Mobility Overal bed mobility: Needs Assistance Bed Mobility: Supine to Sit     Supine to sit: +2 for physical assistance;Mod assist     General bed mobility comments: pt requires (A) to exit the bed on L side with max (A) for bil LE. pt sittin eob min (A) initially and progressed to min guard  Transfers Overall transfer level: Needs assistance Equipment used: Rolling walker (2 wheeled) Transfers: Sit to/from [facility] Sit to Stand: +2 physical assistance;Mod assist;From elevated surface Stand pivot transfers: +2 physical assistance;Mod assist;From elevated surface       General transfer comment: pt requires elevated surface adn anterior weight shift to stand. pt very flexed posture and needs (A) to sustain anterior shift. pt pivot to chair on left side.      Balance                                           ADL either performed or assessed with clinical judgement   ADL Overall ADL's : Needs assistance/impaired Eating/Feeding: Set up;Sitting   Grooming: Set up;Sitting   Upper Body Bathing: Minimal assistance;Sitting   Lower Body Bathing: Total assistance   Upper Body Dressing : Minimal assistance   Lower Body Dressing: Total assistance   Toilet Transfer: +2 for physical assistance;Moderate assistance;RW Toilet Transfer Details (indicate cue type and reason): simulated EOB to chair           General ADL Comments: pt with coughing at EOB and producing secretions x4 yellow in color     Vision         Perception     Praxis      Pertinent Vitals/Pain Pain Assessment: Faces Faces Pain Scale: Hurts even more Pain Location: R leg Pain Descriptors / Indicators: Operative site guarding;Grimacing Pain Intervention(s): Monitored during session;Premedicated before session;Repositioned     Hand Dominance Right   Extremity/Trunk Assessment Upper Extremity Assessment Upper Extremity Assessment: Generalized weakness   Lower Extremity Assessment Lower Extremity Assessment: Defer to PT evaluation   Cervical / Trunk Assessment Cervical / Trunk Assessment: Kyphotic   Communication Communication Communication: No difficulties   Cognition Arousal/Alertness: Awake/alert Behavior During Therapy: WFL for tasks assessed/performed Overall Cognitive Status: History of cognitive impairments - at baseline  General Comments  noted to have staples in L hip area from surgery 09/04/18. MD RN and hospitalist team notified via secure chat.     Exercises     Shoulder Instructions      Home Living Family/patient expects to be discharged to:: Skilled nursing facility                                        Prior Functioning/Environment Level of  Independence: Needs assistance    ADL's / Homemaking Assistance Needed: per chart pt was transfering at SNF - (A) at baseline with adls.             OT Problem List: Decreased strength;Decreased activity tolerance;Impaired balance (sitting and/or standing);Decreased safety awareness;Decreased knowledge of use of DME or AE;Decreased knowledge of precautions;Pain      OT Treatment/Interventions: Self-care/ADL training;Therapeutic exercise;Neuromuscular education;Energy conservation;DME and/or AE instruction;Manual therapy;Therapeutic activities;Patient/family education;Balance training;Cognitive remediation/compensation    OT Goals(Current goals can be found in the care plan section) Acute Rehab OT Goals Patient Stated Goal: to watch yall do it  OT Goal Formulation: Patient unable to participate in goal setting Time For Goal Achievement: 10/27/18 Potential to Achieve Goals: Good  OT Frequency: Min 2X/week   Barriers to D/C: Decreased caregiver support          Co-evaluation PT/OT/SLP Co-Evaluation/Treatment: Yes Reason for Co-Treatment: Complexity of the patient's impairments (multi-system involvement);For patient/therapist safety;To address functional/ADL transfers   OT goals addressed during session: ADL's and self-care;Proper use of Adaptive equipment and DME;Strengthening/ROM      AM-PAC OT "6 Clicks" Daily Activity     Outcome Measure Help from another person eating meals?: A Little Help from another person taking care of personal grooming?: A Little Help from another person toileting, which includes using toliet, bedpan, or urinal?: A Lot Help from another person bathing (including washing, rinsing, drying)?: A Lot Help from another person to put on and taking off regular upper body clothing?: A Little Help from another person to put on and taking off regular lower body clothing?: Total 6 Click Score: 14   End of Session Equipment Utilized During Treatment: Gait  belt;Rolling walker Nurse Communication: Mobility status;Precautions  Activity Tolerance: Patient tolerated treatment well Patient left: in chair;with call bell/phone within reach;with chair alarm set  OT Visit Diagnosis: Unsteadiness on feet (R26.81)                Time: 1443-1540 OT Time Calculation (min): 23 min Charges:  OT General Charges $OT Visit: 1 Visit OT Evaluation $OT Eval Moderate Complexity: 1 Mod   Jeri Modena, OTR/L  Acute Rehabilitation Services Pager: [phone] Office: [phone] .   Jeri Modena 10/13/2018, 1:55 PM

## 2018-10-13 NOTE — Progress Notes (Signed)
MEDICATION-RELATED CONSULT NOTE   IR Procedure Consult - Anticoagulant/Antiplatelet PTA/Inpatient Med List Review by Pharmacist    Procedure: Chest tube placement    Completed: 5/25 2:30pm  Post-Procedural bleeding risk per IR MD assessment:  Will assess as Standard  Antithrombotic medications on inpatient or PTA profile prior to procedure: Lovenox 40mg  SQ daily      Recommended restart time per IR Post-Procedure Guidelines:  Day 1   Other considerations:      Plan:    Resume Lovenox 40mg  SQ daily on 5/26 am  Minda Ditto PharmD Phone [phone] 10/13/2018, 2:37 PM

## 2018-10-13 NOTE — Consult Note (Signed)
[place] Nurse wound consult note Patient receiving care in [facility] 6N10. At the current time, the FYIS box contains the following statement:  "POSSIBLE NOVEL CORONAVIRUS (2019-NCOV)". Reason for Consult: stage 2 sacral ulcer Wound type: NO wound present, only resolved MASD to the gluteal fold area. Dressing procedure/placement/frequency:  Cleanse buttocks and perineal area with no rinse cleanser. Apply Criticaid clear (purple and white tube in clean utility) to the area, paying special attention to the gluteal fold.  Additionally, turn every 2 hours; float heels. Monitor the wound area(s) for worsening of condition such as: Signs/symptoms of infection,  Increase in size,  Development of or worsening of odor, Development of pain, or increased pain at the affected locations.  Notify the medical team if any of these develop.  Thank you for the consult. [place] nurse will not follow at this time.  Please re-consult the [place] team if needed.  Val Riles, RN, MSN, CWOCN, CNS-BC, pager [phone]

## 2018-10-13 NOTE — Procedures (Signed)
  Procedure: CT guided R chest tube placement 16f EBL:   minimal Complications:  none immediate  See full dictation in [employer].  Dillard Cannon MD Main # [phone] Pager  [phone]

## 2018-10-13 NOTE — Social Work (Signed)
CSW acknowledging pt from [facility], as well as PMT involvement. Will follow for therapy recommendations.   Westley Hummer, MSW, [place] Work [phone]

## 2018-10-13 NOTE — Progress Notes (Signed)
Notified Mel Almond from [facility] on pt having 13 beats of SVT. New order received.

## 2018-10-13 NOTE — NC FL2 (Signed)
[place] MEDICAID FL2 LEVEL OF CARE SCREENING TOOL     IDENTIFICATION  Patient Name: Maurice Mckee Birthdate: 1926-02-09 Sex: male Admission Date (Current Location): 10/10/2018  [facility] and [place] Number:  Herbalist and Address:  [facility] [address] [place], [place] [address]      Provider Number: [id number]  Attending Physician Name and Address:  Martyn Malay, MD  Relative Name and Phone Number:  Marco Raper; son; [phone]    Current Level of Care: Hospital Recommended Level of Care: [place] Prior Approval Number:    Date Approved/Denied:   PASRR Number: [id number]  Discharge Plan: SNF    Current Diagnoses: Patient Active Problem List   Diagnosis Date Noted  . Pressure injury of skin 10/11/2018  . Palliative care by specialist   . Hip fracture ([place]) 10/10/2018  . Multiple falls 03/18/2018  . Fracture of inferior pubic ramus ([place]) 03/18/2018  . Delirium 03/18/2018  . BPH (benign prostatic hyperplasia) 03/18/2018  . 'light-for-dates' infant with signs of fetal malnutrition 09/09/2017  . Urinary urgency 06/21/2017  . Spinal stenosis of lumbar region 05/26/2017  . Fracture of two ribs of right side, closed, initial encounter 01/25/2017  . Anemia, normocytic normochromic 11/09/2016  . Non-seasonal allergic rhinitis 02/15/2016  . Risk for falls 11/16/2015  . Vision problem 11/16/2015  . Vitamin D deficiency 07/29/2015  . Urinary incontinence 07/08/2015  . Pulmonary emphysema ([place]) 11/26/2014  . 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 Primary osteoarthritis of right hip 08/30/2014  . Hyperlipidemia 05/10/2014  . Right hip pain 12/24/2013  . Enlarged prostate without lower urinary tract symptoms (luts) 12/24/2013  . Hyperglycemia 12/24/2013  . Numbness 12/24/2013  . Chronic systolic congestive heart failure ([place]) 06/11/2013  . Congestive heart failure ([place]) 06/11/2013   . Dementia without behavioral disturbance ([place]) 06/11/2013  . Essential hypertension 06/11/2013  . Primary cardiomyopathy ([place]) 06/11/2013  . Abnormal glucose 03/02/2013  . Diarrhea 03/02/2013  . Insomnia 03/02/2013  . Abnormality of gait 10/25/2012  . Goals of care, counseling/discussion 10/25/2012  . Generalized osteoarthritis of multiple sites 10/25/2012  . Skin sensation disturbance 10/25/2012  . Backache 08/20/2011  . Other allergy, other than to medicinal agents 01/19/2010    Orientation RESPIRATION BLADDER Height & Weight     Self, Place  O2(nasal canula 2L) External catheter, Incontinent Weight: 140 lb 3.4 oz (63.6 kg) Height:  5\' 11"  (180.3 cm)  BEHAVIORAL SYMPTOMS/MOOD NEUROLOGICAL BOWEL NUTRITION STATUS      Continent Diet(see discharge summary)  AMBULATORY STATUS COMMUNICATION OF NEEDS Skin   Extensive Assist Verbally PU Stage and Appropriate Care, Other (Comment)(PU Stage 2 on sacrum with foam; generalized ecchymosis on arms)   PU Stage 2 Dressing: (foam dressing on sacrum)                   Personal Care Assistance Level of Assistance  Bathing, Feeding, Dressing Bathing Assistance: Maximum assistance Feeding assistance: Limited assistance Dressing Assistance: Maximum assistance     Functional Limitations Info  Sight, Hearing, Speech Sight Info: Adequate Hearing Info: Adequate Speech Info: Adequate    SPECIAL CARE FACTORS FREQUENCY  PT (By licensed PT), OT (By licensed OT)     PT Frequency: 5x week OT Frequency: 5x week            Contractures Contractures Info: Not present    Additional Factors Info  Code Status, Allergies Code Status Info: Full Code Allergies Info: Lactose Intolerance (Gi)  Current Medications (10/13/2018):  This is the current hospital active medication list Current Facility-Administered Medications  Medication Dose Route Frequency Provider Last Rate Last Dose  . acetaminophen (TYLENOL) tablet 650 mg  650  mg Oral Q6H Bland, Scott, DO       Or  . acetaminophen (TYLENOL) suppository 650 mg  650 mg Rectal Q6H Bland, Scott, DO      . atorvastatin (LIPITOR) tablet 40 mg  40 mg Oral q1800 Nicholes Stairs, MD   40 mg at 10/12/18 1651  . calcium carbonate (OS-CAL - dosed in mg of elemental calcium) tablet 500 mg of elemental calcium  1 tablet Oral BID WC Martyn Malay, MD      . carvedilol (COREG) tablet 12.5 mg  12.5 mg Oral BID WC Nicholes Stairs, MD   12.5 mg at 10/13/18 3710  . carvedilol (COREG) tablet 12.5 mg  12.5 mg Oral Once Nicholes Stairs, MD      . cholecalciferol (VITAMIN D3) tablet 1,000 Units  1,000 Units Oral Daily Martyn Malay, MD      . docusate sodium (COLACE) capsule 100 mg  100 mg Oral BID Nicholes Stairs, MD   100 mg at 10/13/18 6269  . [START ON 10/14/2018] enoxaparin (LOVENOX) injection 40 mg  40 mg Subcutaneous Q24H Minda Ditto, RPH      . feeding supplement (BOOST / RESOURCE BREEZE) liquid 1 Container  1 Container Oral BID BM Nicholes Stairs, MD   1 Container at 10/12/18 1650  . feeding supplement (ENSURE SURGERY) liquid 237 mL  237 mL Oral BID BM Nicholes Stairs, MD   237 mL at 10/13/18 0836  . fentaNYL (SUBLIMAZE) 100 MCG/2ML injection           . lidocaine (XYLOCAINE) 1 % (with pres) injection           . metoCLOPramide (REGLAN) tablet 5-10 mg  5-10 mg Oral Q8H PRN Nicholes Stairs, MD       Or  . metoCLOPramide [facility]) injection 5-10 mg  5-10 mg Intravenous Q8H PRN Nicholes Stairs, MD      . midazolam (VERSED) 2 MG/2ML injection           . multivitamin liquid 15 mL  15 mL Oral Daily Nicholes Stairs, MD   15 mL at 10/13/18 0836  . ondansetron (ZOFRAN) tablet 4 mg  4 mg Oral Q6H PRN Nicholes Stairs, MD       Or  . ondansetron [facility]) injection 4 mg  4 mg Intravenous Q6H PRN Nicholes Stairs, MD      . oxyCODONE (Oxy IR/ROXICODONE) immediate release tablet 5 mg  5 mg Oral Q4H PRN Bland, Scott, DO      .  sodium chloride flush (NS) 0.9 % injection 5 mL  5 mL Intracatheter Q8H Arne Cleveland, MD         Discharge Medications: Please see discharge summary for a list of discharge medications.  Relevant Imaging Results:  Relevant Lab Results:   Additional Information SS#[id number] [place], [place]

## 2018-10-13 NOTE — Progress Notes (Signed)
Notified Mel Almond from [facility] about a drop in patient blood pressure of 98/57 from 155/77. No bleeding noted to incision site to right hip. Stated if MAP drops less that 60 to notify her.

## 2018-10-13 NOTE — Progress Notes (Addendum)
Family Medicine Teaching Service Daily Progress Note Intern Pager: [phone]  Patient name: Maurice Mckee Medical record number: [id number] Date of birth: 05/28/1925 Age: 85 y.o. Gender: male  Primary Care Provider: Patient, No Pcp Per Consultants: Ortho Code Status: Full   Pt Overview and Major Events to Date:  5/22 admitted for R hip fracture 5/24 - IM fixiation of right femur 5/25 - Right Pneumothorax s/p right chest tube placement  Assessment and Plan: Maurice Mckee is a 85 y.o. male presenting from SNF after right hip fracture. PMH is significant for left hip fracture s/p repair on 09/04/18 (SNF at [facility]), recent h/o PNA in April, severe dementia, CHF (EF 45-50% on 04/13/18) , HTN,  HLD, and vitamin D deficiency  Right lower lobe consolidation: CXR with new RLL consolidation. Has remained afebrile. Endorses cough yesterday, but patient is a poor historian given baseline dementia. Stable on 2.5L O2 with good aeration throughout, some crackles appreciated on right lower lobe. Leukocytosis stable at 18.4 this AM. Consolidation may be residual from previous PNA plus atelectasis from recent surgery.  History of PNA on 09/04/18 treated with Rocephin, Zithromax, and Cefuroxime.  - Continue to monitor - consider starting antibiotics: Zosyn vs Cefepime if worsening dyspnea, cough, or development of fever  Right Pneumothorax s/p right thoracostomy: S/p placement CT guided right thoracostomy on 5/25. Tolerated procedure well. No signs of infections, air leak, or bleeds. Breathing comfortably on 2.5L O2.  Repeat CXR this AM with reexpansion of lung. Good aeration throughout lungs on exam - follow up IR recs for management of chest tube - follow up daily chest x-rays  Right femoral fracture s/p IM  H/o Multiple Falls: Improving well.  Hgb 9.4>8.1>7.8. No signs of infection, bleeding, or hematoma. Denies any pain this AM. No PRN Oxy required overnight. Bandage clean and intact this AM  without signs of infection or bleeding. Nontender to palpation. - Ortho following appreciate recommendations - Per Orthopedics WBAT, wound care, and follow up in 2 wks for staple removal, ice to hip  - Resume ASA/plavix on discharge   - Tylenol and tramadol PRN for pain  - Continue lovenox and SCDs for DVT prophylaxis - PT/OT- recommend SNF, 3n1 bedside commode, wheelchair with cushion  Anemia: stable Hgb 9.4 >8.1>7.8. BL 12-13. Likely due to surgery.  - daily CBC - cont home iron  HFrEF (45-50%)  Pacemaker: Appears euvolemic on on exam this AM.  - continue home Coreg 12.5mg  BID - restart asa, plavix on discharge - PM BMP - Plan to restart home lasix tomorrow if electrolytes stable - daily BMP  Advanced Dementia: Home meds: Aricept and Namenda - hold home meds, may stop during this hospital stay given patient likely not benefiting from these medications  Severe Protein calorie malnutrition: Albumin 2.6. Cachetic appearing on exam.  - Ensure BID with meals, Boost BID between meals  FEN/GI: ADAT Prophylaxis: SCDs  Disposition: SNF   Subjective:  Doing well this morning.  Alert and oriented to self only.  Denies any pain, chest pain, or shortness of breath.  Had left hip staples removed overnight without complication.  Tolerating p.o.  Objective: Temp:  [98.3 F (36.8 C)-98.8 F (37.1 C)] 98.5 F (36.9 C) (05/26 0435) Pulse Rate:  [73-92] 82 (05/26 0931) Resp:  [14-16] 16 (05/26 0435) BP: (109-144)/(55-68) 127/57 (05/26 0931) SpO2:  [91 %-100 %] 93 % (05/26 0435) Physical Exam: General: Pleasant elderly male, very thin, in no acute distress with non-toxic appearance, comfortably in bed HEENT: normocephalic, atraumatic  Neck: supple CV: regular rate and rhythm without murmurs, rubs, or gallops, no lower extremity edema, 2+ radial and pedal pulses bilaterally, SCDs in place Lungs: Crackles appreciated in right lower lobe, good air movement throughout otherwise, normal  work of breathing on 2.5L oxygen Abdomen: soft, non-tender, non-distended, normoactive bowel sounds Skin: warm, dry, right hip bandages clean and intact, no signs of erythema, ecchymosis, or bleeding.  Left hip incision well-appearing s/p staple removal overnight, no erythema edema or signs of action, lower extremities tender bilaterally Extremities: warm and well perfused Neuro: Alert and orientedx1, [place] difficult to understand, answers some questions appropriately, No evidence of focal neurologic deficit  Laboratory: Recent Labs  Lab 10/12/18 1617 10/13/18 0314 10/14/18 0850  WBC 20.5* 17.5* 18.4*  HGB 9.4* 8.1* 7.8*  HCT 30.1* 26.0* 24.7*  PLT 214 227 187   Recent Labs  Lab 10/10/18 1300 10/11/18 0327 10/12/18 0317 10/13/18 0314  NA 141 138 138 139  K 4.2 4.1 4.1 4.7  CL 107 107 104 104  CO2 25 25 26 26   BUN 17 17 29* 32*  CREATININE 0.94 0.80 0.98 0.93  CALCIUM 8.6* 8.2* 8.2* 8.3*  PROT 5.4* 4.7*  --   --   BILITOT 0.7 0.8  --   --   ALKPHOS 126 116  --   --   ALT 19 18  --   --   AST 18 15  --   --   GLUCOSE 136* 119* 179* 147*    TSH 3.805  EKG NSR  Iron/TIBC/Ferritin/ %Sat    Component Value Date/Time   IRON 16 (L) 10/11/2018 0327   TIBC 176 (L) 10/11/2018 0327   FERRITIN 393 (H) 10/11/2018 0327   IRONPCTSAT 9 (L) 10/11/2018 0327     Imaging/Diagnostic Tests: Dg Chest Port 1 View  Result Date: 10/14/2018 CLINICAL DATA:  Pneumothorax, chest tube EXAM: PORTABLE CHEST 1 VIEW COMPARISON:  10/13/2018 FINDINGS: Right chest tube has been placed with re-expansion of the right lung. No visible pneumothorax currently. Right lower lobe consolidation. Left lung clear. Heart is mildly enlarged. Left AICD is unchanged. IMPRESSION: Interval placement of right chest tube with re-expansion of the right lung. No visible pneumothorax. Right lower lobe consolidation. Cardiomegaly. Electronically Signed   By: Rolm Baptise M.D.   On: 10/14/2018 08:13   Ct Perc Pleural  Drain W/indwell Cath W/img Guide  Result Date: 10/13/2018 CLINICAL DATA:  Hypoxia. Right pneumothorax noted. Recent anesthesia for fixation of right femur neck fracture. EXAM: CT GUIDED CHEST TUBE PLACEMENT ANESTHESIA/SEDATION: Intravenous Fentanyl 73mcg and Versed 0.5mg  were administered as conscious sedation during continuous monitoring of the patient's level of consciousness and physiological / cardiorespiratory status by the radiology RN, with a total moderate sedation time of 13 minutes. PROCEDURE: The procedure, risks, benefits, and alternatives were explained to the son. Questions regarding the procedure were encouraged and answered. The son understands and consents to the procedure. Select scans through the thorax were obtained. The right pneumothorax was localized and an appropriate skin entry site was determined and marked. The operative field was prepped with chlorhexidinein a sterile fashion, and a sterile drape was applied covering the operative field. A sterile gown and sterile gloves were used for the procedure. Local anesthesia was provided with 1% Lidocaine. Under CT guidance, a 19 gauge percutaneous entry needle was advanced into the right pleural space. Gas could be aspirated. An Amplatz wire advanced centrally easily. Tract was dilated to facilitate placement of a 14 French pigtail catheter. CT confirms  good position with evacuation of the pneumothorax. The catheter was secured externally with 0 Prolene suture and StatLock, and placed to Pleur-evac -20 cm H2O suction. The patient tolerated the procedure well. COMPLICATIONS: None immediate FINDINGS: Simple appearing moderate right pneumothorax was localized. 14 French pigtail chest tube placed as above. IMPRESSION: 1. Technically successful CT-guided right chest tube placement Electronically Signed   By: Lucrezia Europe M.D.   On: 10/13/2018 14:57    Danna Hefty, DO 10/14/2018, 1:12 PM PGY-1, [place] Intern pager:  [phone], text pages welcome

## 2018-10-14 ENCOUNTER — Encounter (HOSPITAL_COMMUNITY): Payer: Self-pay | Admitting: Orthopedic Surgery

## 2018-10-14 ENCOUNTER — Inpatient Hospital Stay (HOSPITAL_COMMUNITY): Payer: Medicare Other

## 2018-10-14 DIAGNOSIS — J95811 Postprocedural pneumothorax: Secondary | ICD-10-CM

## 2018-10-14 LAB — CBC
HCT: 24.7 % — ABNORMAL LOW (ref 39.0–52.0)
Hemoglobin: 7.8 g/dL — ABNORMAL LOW (ref 13.0–17.0)
MCH: 32.2 pg (ref 26.0–34.0)
MCHC: 31.6 g/dL (ref 30.0–36.0)
MCV: 102.1 fL — ABNORMAL HIGH (ref 80.0–100.0)
Platelets: 187 10*3/uL (ref 150–400)
RBC: 2.42 MIL/uL — ABNORMAL LOW (ref 4.22–5.81)
RDW: 14.9 % (ref 11.5–15.5)
WBC: 18.4 10*3/uL — ABNORMAL HIGH (ref 4.0–10.5)
nRBC: 0 % (ref 0.0–0.2)

## 2018-10-14 LAB — BASIC METABOLIC PANEL
Anion gap: 7 (ref 5–15)
BUN: 33 mg/dL — ABNORMAL HIGH (ref 8–23)
CO2: 28 mmol/L (ref 22–32)
Calcium: 8.5 mg/dL — ABNORMAL LOW (ref 8.9–10.3)
Chloride: 105 mmol/L (ref 98–111)
Creatinine, Ser: 0.81 mg/dL (ref 0.61–1.24)
GFR calc Af Amer: >60 mL/min (ref >60)
GFR calc non Af Amer: >60 mL/min (ref >60)
Glucose, Bld: 136 mg/dL — ABNORMAL HIGH (ref 70–99)
Potassium: 4 mmol/L (ref 3.5–5.1)
Sodium: 140 mmol/L (ref 135–145)

## 2018-10-14 LAB — FOLATE RBC
Folate, Hemolysate: 425 ng/mL
Folate, RBC: 1628 ng/mL (ref >498)
Hematocrit: 26.1 % — ABNORMAL LOW (ref 37.5–51.0)

## 2018-10-14 MED ORDER — TRAMADOL HCL 50 MG PO TABS
50.0000 mg | ORAL_TABLET | Freq: Four times a day (QID) | ORAL | Status: DC | PRN
  Administered 2018-10-20: 50 mg via ORAL
  Filled 2018-10-14: qty 1

## 2018-10-14 NOTE — Progress Notes (Signed)
FPTS Interim Progress Note  Attempted to contact patient son, Zackory Pudlo [phone], several times to let him know he could come visit his son. No voicemail set up, thus unable to leave message. Will have nurse attempt to call patient's son again this evening to let him know. Visitor order placed.  Mina Marble [place], DO 10/14/2018, 4:01 PM PGY-1, [place] Medicine Service pager [phone]

## 2018-10-14 NOTE — Progress Notes (Signed)
Staples removed from left hip. Pt tolerated procedure without difficulty. Incision healed.

## 2018-10-14 NOTE — Care Management Important Message (Signed)
Important Message  Patient Details  Name: Shelly Shoultz MRN: [id number] Date of Birth: September 15, 1925   Medicare Important Message Given:  Yes Due to illness patient did not sign.  Unsigned copy left at the patient bedside.   Claire Dolores 10/14/2018, 2:06 PM

## 2018-10-14 NOTE — Progress Notes (Signed)
Palliative note:   Patient in bed- continues to be confused, speech is unintelligible. Per RN he ate good amount of breakfast.   Dr. Koleen Distance (resident rounding with Palliative team today) attempted to call patient's son for followup. He was dismissive, did not engage. He inquired about video call.   Request for video call was relayed to unit.  Mariana Kaufman, AGNP-C Palliative Medicine  Please call Palliative Medicine team phone with any questions [phone]. For individual providers please see AMION.  Time In: 1000 Time Out:1025 Total Time:25 minutes  Greater than 50%  of this time was spent counseling and coordinating care related to the above assessment and plan

## 2018-10-14 NOTE — Progress Notes (Signed)
   Subjective:  Patient reports pain as mild.  No complaints.  But confused.  Objective:   VITALS:   Vitals:   10/13/18 1613 10/13/18 2212 10/14/18 0435 10/14/18 0931  BP: (!) 120/55 126/67 (!) 144/67 (!) 127/57  Pulse: 76 92 79 82  Resp: 16 16 16    Temp: 98.4 F (36.9 C) 98.8 F (37.1 C) 98.5 F (36.9 C)   TempSrc: Oral Oral Oral   SpO2: 97% 100% 93%   Weight:      Height:       Confused, no acute distress.  Intact pulses distally Dorsiflexion/Plantar flexion intact Incision: dressing C/D/I Compartment soft   Lab Results  Component Value Date   WBC 18.4 (H) 10/14/2018   HGB 7.8 (L) 10/14/2018   HCT 24.7 (L) 10/14/2018   MCV 102.1 (H) 10/14/2018   PLT 187 10/14/2018   BMET    Component Value Date/Time   NA 139 10/13/2018 0314   NA 140 03/25/2018   K 4.7 10/13/2018 0314   CL 104 10/13/2018 0314   CO2 26 10/13/2018 0314   GLUCOSE 147 (H) 10/13/2018 0314   BUN 32 (H) 10/13/2018 0314   BUN 19 03/25/2018   CREATININE 0.93 10/13/2018 0314   CALCIUM 8.3 (L) 10/13/2018 0314   GFRNONAA >60 10/13/2018 0314   GFRAA >60 10/13/2018 0314     Assessment/Plan: 2 Days Post-Op   Active Problems:   Right hip pain   Goals of care, counseling/discussion   Hip fracture (HCC)   Pressure injury of skin   Palliative care by specialist   Up with therapy - WBAT to right leg - maintain bandages scds and lovenox for DVT ppx in house, at dc he may resume his asa and plavix as before surgery. - tylenol and tramadol prn for pain.   - frequent ice to right hip - follow up in 2 weeks with Stann Mainland for routine post op care.    Nicholes Stairs 10/14/2018, 11:53 AM   Geralynn Rile, MD [phone]

## 2018-10-14 NOTE — Progress Notes (Signed)
  Chest tube placed by Dr. Vernard Gambles.  Patient doing ok.  No air leak.  Will put to water seal.  Check CXR in am.  Judie Grieve Stephenson Cichy PA-C 10/14/2018 4:38 PM

## 2018-10-14 NOTE — TOC Initial Note (Signed)
Transition of Care [facility]) - Initial/Assessment Note    Patient Details  Name: Maurice Mckee MRN: [id number] Date of Birth: 03/12/1926  Transition of [facility]) CM/SW Contact:    Benard Halsted, LCSW Phone Number: 10/14/2018, 3:05 PM  Clinical Narrative:                 Boen Sterbenz a 85 y.o.malepresenting from SNF after right hip fracture.   CSW received consult for possible SNF placement at time of discharge. CSW spoke with patient's son. He had questions regarding whether patient could return home with caregivers rather than return to [facility]. CSW updated him on patient's mobility and need for two people to assist him. Francee Piccolo was concerned that patient had not been around family in a long time and really wanted the patient to get back home with home health. He asked if patient would be able to go to a facility from home if it did not work out. CSW explained that it would be a more complicated process to do with home health from home, especially since patient will be in SNF copay days (does not have Medicaid). Francee Piccolo stated he will consider the options. Patient's son expressed being hopeful for rehab and to feel better soon. No further questions reported at this time. CSW to continue to follow and assist with discharge planning needs.   Expected Discharge Plan: Skilled Nursing Facility Barriers to Discharge: Continued Medical Work up   Patient Goals and CMS Choice Patient states their goals for this hospitalization and ongoing recovery are:: Return home CMS Medicare.gov Compare Post Acute Care list provided to:: (NA) Choice offered to / list presented to : NA(From SNF)  Expected Discharge Plan and Services Expected Discharge Plan: [place] In-house Referral: Clinical Social Work Discharge Planning Services: NA Post Acute Care Choice: [place] Living arrangements for the past 2 months: [place]                 DME Arranged:  N/A DME Agency: NA       HH Arranged: NA [place] Agency: NA        Prior Living Arrangements/Services Living arrangements for the past 2 months: [place] Lives with:: Adult Children Patient language and need for interpreter reviewed:: Yes Do you feel safe going back to the place where you live?: Yes      Need for Family Participation in Patient Care: Yes (Comment) Care giver support system in place?: Yes (comment)   Criminal Activity/Legal Involvement Pertinent to Current Situation/Hospitalization: No - Comment as needed  Activities of Daily Living Home Assistive Devices/Equipment: Other (Comment)(From rehab Center) ADL Screening (condition at time of admission) Patient's cognitive ability adequate to safely complete daily activities?: No Is the patient deaf or have difficulty hearing?: No Does the patient have difficulty seeing, even when wearing glasses/contacts?: Yes Does the patient have difficulty concentrating, remembering, or making decisions?: Yes Patient able to express need for assistance with ADLs?: No Does the patient have difficulty dressing or bathing?: Yes Independently performs ADLs?: No Communication: Needs assistance Is this a change from baseline?: Pre-admission baseline Dressing (OT): Dependent Is this a change from baseline?: Pre-admission baseline Grooming: Dependent Is this a change from baseline?: Pre-admission baseline Feeding: Dependent Is this a change from baseline?: Pre-admission baseline Bathing: Dependent Is this a change from baseline?: Pre-admission baseline Toileting: Dependent Is this a change from baseline?: Pre-admission baseline In/Out Bed: Dependent Is this a change from baseline?: Pre-admission baseline Walks in  Home: Dependent Is this a change from baseline?: Pre-admission baseline Does the patient have difficulty walking or climbing stairs?: Yes Weakness of Legs: Both Weakness of Arms/Hands: Both  Permission  Sought/Granted Permission sought to share information with : Facility Sport and exercise psychologist, Family Supports Permission granted to share information with : Yes, Verbal Permission Granted  Share Information with NAME: Francee Piccolo  Permission granted to share info w AGENCY: [facility]  Permission granted to share info w Relationship: Son  Permission granted to share info w Contact Information: [phone]  Emotional Assessment Appearance:: Appears stated age Attitude/Demeanor/Rapport: Unable to Assess Affect (typically observed): Unable to Assess Orientation: : Oriented to Self Alcohol / Substance Use: Not Applicable Psych Involvement: No (comment)  Admission diagnosis:  Hip fracture ([place]) [S72.009A] Hip fx ([place]) [S72.009A] Hypoxia [R09.02] Closed fracture of right hip, initial encounter [facility]) [S72.001A] Patient Active Problem List   Diagnosis Date Noted  . Pressure injury of skin 10/11/2018  . Palliative care by specialist   . Hip fracture ([place]) 10/10/2018  . Multiple falls 03/18/2018  . Fracture of inferior pubic ramus ([place]) 03/18/2018  . Delirium 03/18/2018  . BPH (benign prostatic hyperplasia) 03/18/2018  . 'light-for-dates' infant with signs of fetal malnutrition 09/09/2017  . Urinary urgency 06/21/2017  . Spinal stenosis of lumbar region 05/26/2017  . Fracture of two ribs of right side, closed, initial encounter 01/25/2017  . Anemia, normocytic normochromic 11/09/2016  . Non-seasonal allergic rhinitis 02/15/2016  . Risk for falls 11/16/2015  . Vision problem 11/16/2015  . Vitamin D deficiency 07/29/2015  . Urinary incontinence 07/08/2015  . Pulmonary emphysema ([place]) 11/26/2014  . 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 Primary osteoarthritis of right hip 08/30/2014  . Hyperlipidemia 05/10/2014  . Right hip pain 12/24/2013  . Enlarged prostate without lower urinary tract symptoms (luts) 12/24/2013  . Hyperglycemia 12/24/2013  . Numbness 12/24/2013  .  Chronic systolic congestive heart failure ([place]) 06/11/2013  . Congestive heart failure ([place]) 06/11/2013  . Dementia without behavioral disturbance ([place]) 06/11/2013  . Essential hypertension 06/11/2013  . Primary cardiomyopathy ([place]) 06/11/2013  . Abnormal glucose 03/02/2013  . Diarrhea 03/02/2013  . Insomnia 03/02/2013  . Abnormality of gait 10/25/2012  . Goals of care, counseling/discussion 10/25/2012  . Generalized osteoarthritis of multiple sites 10/25/2012  . Skin sensation disturbance 10/25/2012  . Backache 08/20/2011  . Other allergy, other than to medicinal agents 01/19/2010   PCP:  Patient, No Pcp Per Pharmacy:  No Pharmacies Listed    Social Determinants of Health (SDOH) Interventions    Readmission Risk Interventions No flowsheet data found.

## 2018-10-15 ENCOUNTER — Inpatient Hospital Stay (HOSPITAL_COMMUNITY): Payer: Medicare Other

## 2018-10-15 LAB — CBC
HCT: 25.8 % — ABNORMAL LOW (ref 39.0–52.0)
Hemoglobin: 8 g/dL — ABNORMAL LOW (ref 13.0–17.0)
MCH: 31.6 pg (ref 26.0–34.0)
MCHC: 31 g/dL (ref 30.0–36.0)
MCV: 102 fL — ABNORMAL HIGH (ref 80.0–100.0)
Platelets: 240 10*3/uL (ref 150–400)
RBC: 2.53 MIL/uL — ABNORMAL LOW (ref 4.22–5.81)
RDW: 14.8 % (ref 11.5–15.5)
WBC: 16.8 10*3/uL — ABNORMAL HIGH (ref 4.0–10.5)
nRBC: 0 % (ref 0.0–0.2)

## 2018-10-15 LAB — BASIC METABOLIC PANEL
Anion gap: 8 (ref 5–15)
BUN: 31 mg/dL — ABNORMAL HIGH (ref 8–23)
CO2: 28 mmol/L (ref 22–32)
Calcium: 8.4 mg/dL — ABNORMAL LOW (ref 8.9–10.3)
Chloride: 105 mmol/L (ref 98–111)
Creatinine, Ser: 0.81 mg/dL (ref 0.61–1.24)
GFR calc Af Amer: >60 mL/min (ref >60)
GFR calc non Af Amer: >60 mL/min (ref >60)
Glucose, Bld: 109 mg/dL — ABNORMAL HIGH (ref 70–99)
Potassium: 4.4 mmol/L (ref 3.5–5.1)
Sodium: 141 mmol/L (ref 135–145)

## 2018-10-15 MED ORDER — FUROSEMIDE 20 MG PO TABS
20.0000 mg | ORAL_TABLET | Freq: Every day | ORAL | Status: DC
  Administered 2018-10-15 – 2018-10-21 (×7): 20 mg via ORAL
  Filled 2018-10-15 (×7): qty 1

## 2018-10-15 MED ORDER — ADULT MULTIVITAMIN W/MINERALS CH
1.0000 | ORAL_TABLET | Freq: Every day | ORAL | Status: DC
  Administered 2018-10-16 – 2018-10-21 (×6): 1 via ORAL
  Filled 2018-10-15 (×6): qty 1

## 2018-10-15 NOTE — Plan of Care (Signed)
  Problem: Elimination: Goal: Will not experience complications related to bowel motility Outcome: Progressing   Problem: Safety: Goal: Ability to remain free from injury will improve Outcome: Progressing   Problem: Nutrition: Goal: Adequate nutrition will be maintained Outcome: Progressing   

## 2018-10-15 NOTE — Progress Notes (Signed)
Nutrition Follow-up  RD working remotely.  DOCUMENTATION CODES:   Not applicable  INTERVENTION:   -D/c Boost Breeze po BID, each supplement provides 250 kcal and 9 grams of protein -Continue Ensure Surgery BID, each supplement provides 330 kcals and 18 grams protein; also provides 4.2 grams arginine and 1.1 grams EPA & DHA (omega-3 fatty acids) to support post-operative healing -D/c liquid MVI -MVI with minerals daily -Magic Cup BID with meals, each supplement provides 290 kcals and 9 grams protein  NUTRITION DIAGNOSIS:   Increased nutrient needs related to post-op healing, wound healing as evidenced by estimated needs.  Ongoing  GOAL:   Patient will meet greater than or equal to 90% of their needs  Progressing   MONITOR:   PO intake, Labs, I & O's, Supplement acceptance, Diet advancement, Weight trends  REASON FOR ASSESSMENT:   Consult, Malnutrition Screening Tool Hip fracture protocol  ASSESSMENT:   85 y/o male PMHx advanced dementia, HF, HTN, HLD. Has been at [facility] for rehab after a recent L hip fx s/p hemiarthroplasty 4/17. Presents with R hip pain after falling again. Hip Xray confirmed fracture. Admitted for potential surgical intervention  5/24- s/p PROCEDURE: Treatment of intertrochanteric, pertrochanteric, subtrochanteric fracture with intramedullary implant. CPT [phone]  5/25- rt chest tube placement due to spontaneous pneumothorax 5/26- lt hip staples removed  Reviewed I/O's: +141 ml x 24 hours and -57 ml since admission  UOP: 550 ml x 24 hours  Chest tube output: 29 ml x 24 hours  Pt unable to provide further history secondary to advanced dementia.   Appetite has been variable, but improving. Noted meal completion 25-75%. Pt has been consuming both Boost Breeze and Ensure Surgery supplements. Will continue with Ensure Surgery supplements due to diet advancement and increased nutritional density.   Per CSW notes, plan to d/c back to SNF at discharge.    Labs reviewed.   Diet Order:   Diet Order            DIET - DYS 1 Room service appropriate? Yes with Assist; Fluid consistency: Thin  Diet effective now              EDUCATION NEEDS:   Not appropriate for education at this time  Skin:  Skin Assessment: Skin Integrity Issues: Skin Integrity Issues:: Stage II, Incisions Stage II: sacrum with early/partial granulation Incisions: closed lt hip  Last BM:  10/15/18  Height:   Ht Readings from Last 1 Encounters:  10/10/18 5\' 11"  (1.803 m)    Weight:   Wt Readings from Last 1 Encounters:  10/10/18 63.6 kg    Ideal Body Weight:  78.18 kg  BMI:  Body mass index is 19.56 kg/m.  Estimated Nutritional Needs:   Kcal:  1700-1900 (27-30 kcal/kg bw)  Protein:  83-95g Pro (1.3-1.5g/kg bw)  Fluid:  1.6-1.9 L (25-30 mls/kg bw)    Dylyn Mclaren A. Jimmye Norman, RD, LDN, [place] Registered Dietitian II Certified Diabetes Care and Education Specialist Pager: [phone] After hours Pager: [phone]

## 2018-10-15 NOTE — Progress Notes (Addendum)
Patient ID: Maurice Mckee, male   DOB: Jun 16, 1925, 85 y.o.   MRN: [id number]   CORRECTION:  CT has been on water seal since yesterday 430 pm  CXR: IMPRESSION: 1. Support apparatus, as above. 2. No residual pneumothorax identified. Right-sided chest tube appears stable in position, but there continues to be a small right pleural effusion with some associated atelectasis and/or consolidation throughout the right mid to lower lung. 3. Aortic atherosclerosis.   Will discuss with Dr Marcellina Millin Discussed with Dr Anselm Pancoast Will keep on water seal another 24 hrs. If CXR still with NO PTX in am-- pull chest tube

## 2018-10-15 NOTE — Progress Notes (Signed)
Family Medicine Teaching Service Daily Progress Note Intern Pager: [phone]  Patient name: Maurice Mckee Medical record number: [id number] Date of birth: 1925/08/30 Age: 85 y.o. Gender: male  Primary Care Provider: Patient, No Pcp Per Consultants: Ortho Code Status: Full   Pt Overview and Major Events to Date:  5/22 admitted for R hip fracture 5/24 - IM fixiation of right femur 5/25 - Right Pneumothorax s/p right chest tube placement  Assessment and Plan: Maurice Mckee is a 85 y.o. male presenting from SNF after right hip fracture. PMH is significant for left hip fracture s/p repair on 09/04/18 (SNF at [facility] recent h/o PNA in April, severe dementia, CHF (EF 45-50% on 04/13/18) , HTN,  HLD, and vitamin D deficiency  Right Pneumothorax s/p right thoracostomy: S/p placement CT guided right thoracostomy on 5/25. No signs of infections, air leak, or bleeds. Breathing comfortably on room air.  Repeat CXR this AM with no residual pneumothorax, stable right chest tube. Good aeration throughout lungs on exam.  - follow up IR recs for management of chest tube: Continue waterseal another 24 hours with plan to pull chest tube in a.m. if no residual pneumothorax - follow up daily chest x-rays  Right lower lobe consolidation: stable CXR this AM with right pleural effusion with some atelectasis vs consolidation. Continues to remain afebrile without signs of infection. Stable on room air. Leukocytosis down-trending: 18.4>16.8.  History of PNA on 09/04/18 treated with Rocephin, Zithromax, and Cefuroxime.  - Continue to monitor - consider starting antibiotics: Zosyn vs Cefepime if worsening dyspnea, cough, or development of fever  Right femoral fracture s/p IM  H/o Multiple Falls: Improving well.  Hemoglobin stable at 8.  No signs of infection, bleeding, or hematoma, some tenderness to palpation.  Bandage clean and intact without signs of infection or bleeding. - Ortho following appreciate  recommendations - Per Orthopedics WBAT, wound care, and follow up in 2 wks for staple removal, ice to hip  - Resume ASA/plavix on discharge   - Tylenol and tramadol PRN for pain  - Continue lovenox and SCDs for DVT prophylaxis - PT/OT- recommend SNF, 3n1 bedside commode, wheelchair with cushion  Anemia: stable Hgb 9.4 >8.1>7.8. BL 12-13. Likely due to surgery.  - daily CBC - cont home iron  HFrEF (45-50%)  Pacemaker: Continues to appear euvolemic on exam.  - continue home Coreg 12.5mg  BID - restart asa, plavix on discharge - Restart home lasix - daily BMP - monitor fluid status  Advanced Dementia: Home meds: Aricept and Namenda - hold home meds, may stop during this hospital stay given patient likely not benefiting from these medications  Severe Protein calorie malnutrition: Albumin 2.6. Cachetic appearing on exam.  - Ensure BID with meals, Boost BID between meals  FEN/GI: ADAT Prophylaxis: SCDs  Disposition: SNF   Subjective:  Doing well this morning.  Sitting up comfortably eating breakfast with nurse.  Conversing more. Denies any pain or any concerns. Tolerating p.o.  Objective: Temp:  [97.8 F (36.6 C)-98.3 F (36.8 C)] 97.8 F (36.6 C) (05/27 0514) Pulse Rate:  [72-82] 75 (05/27 0514) Resp:  [16-18] 18 (05/27 0514) BP: (113-143)/(45-67) 143/67 (05/27 0514) SpO2:  [96 %-98 %] 98 % (05/27 0514) Physical Exam: General: Elderly male, cachectic appearing, NAD, sitting up comfortably eating breakfast with nurse at bedside  HEENT: normocephalic, atraumatic Neck: supple, normal ROM CV: regular rate and rhythm without murmurs, rubs, or gallops, no lower extremity edema, 2+ radial and pedal pulses bilaterally, SCDs in place Lungs: Crackles  still appreciated in right lower lobe, however good air movement throughout otherwise, normal work of breathing on room air, chest tube and bandage in place on right side of chest -is clean and intact with no signs of infection or  bleeding Abdomen: soft, non-tender, non-distended, normoactive bowel sounds Skin: warm, dry, and hip bandage clean and intact, no signs of erythema, ecchymosis, or bleeding, some tenderness to palpation, left hip incision well-appearing without signs of infection Extremities: warm and well perfused Neuro: Alert this morning, but would not answer questions appropriately, speech difficult to understand, no evidence of focal neurologic deficit  Laboratory: Recent Labs  Lab 10/13/18 0314 10/14/18 0850 10/15/18 0209  WBC 17.5* 18.4* 16.8*  HGB 8.1* 7.8* 8.0*  HCT 26.0* 24.7* 25.8*  PLT 227 187 240   Recent Labs  Lab 10/10/18 1300 10/11/18 0327  10/13/18 0314 10/14/18 1413 10/15/18 0209  NA 141 138   < > 139 140 141  K 4.2 4.1   < > 4.7 4.0 4.4  CL 107 107   < > 104 105 105  CO2 25 25   < > 26 28 28   BUN 17 17   < > 32* 33* 31*  CREATININE 0.94 0.80   < > 0.93 0.81 0.81  CALCIUM 8.6* 8.2*   < > 8.3* 8.5* 8.4*  PROT 5.4* 4.7*  --   --   --   --   BILITOT 0.7 0.8  --   --   --   --   ALKPHOS 126 116  --   --   --   --   ALT 19 18  --   --   --   --   AST 18 15  --   --   --   --   GLUCOSE 136* 119*   < > 147* 136* 109*   < > = values in this interval not displayed.    TSH 3.805  EKG NSR  Iron/TIBC/Ferritin/ %Sat    Component Value Date/Time   IRON 16 (L) 10/11/2018 0327   TIBC 176 (L) 10/11/2018 0327   FERRITIN 393 (H) 10/11/2018 0327   IRONPCTSAT 9 (L) 10/11/2018 0327     Imaging/Diagnostic Tests: Dg Chest Port 1 View  Result Date: 10/15/2018 CLINICAL DATA:  85 year old male with history of pneumothorax. Chest tube placed. EXAM: PORTABLE CHEST 1 VIEW COMPARISON:  Chest x-ray 10/14/2018. FINDINGS: Right-sided chest tube in position with pigtail reformed in the medial aspect of the right hemithorax. No appreciable pneumothorax. Small to moderate right pleural effusion, small right pleural effusion, similar to the prior examination. Irregular opacities in the right mid  to lower lung may reflect associated atelectasis and/or airspace consolidation. Left lung is clear. No left pleural effusion. No evidence of pulmonary edema. Heart size is upper limits of normal. Upper mediastinal contours are within normal limits. Aortic atherosclerosis. Left-sided pacemaker/AICD with leads projecting over the expected location of the right atrium and right ventricle. IMPRESSION: 1. Support apparatus, as above. 2. No residual pneumothorax identified. Right-sided chest tube appears stable in position, but there continues to be a small right pleural effusion with some associated atelectasis and/or consolidation throughout the right mid to lower lung. 3. Aortic atherosclerosis. Electronically Signed   By: Vinnie Langton M.D.   On: 10/15/2018 08:27   Dg Chest Port 1 View  Result Date: 10/14/2018 CLINICAL DATA:  Pneumothorax, chest tube EXAM: PORTABLE CHEST 1 VIEW COMPARISON:  10/13/2018 FINDINGS: Right chest tube has been placed with  re-expansion of the right lung. No visible pneumothorax currently. Right lower lobe consolidation. Left lung clear. Heart is mildly enlarged. Left AICD is unchanged. IMPRESSION: Interval placement of right chest tube with re-expansion of the right lung. No visible pneumothorax. Right lower lobe consolidation. Cardiomegaly. Electronically Signed   By: Rolm Baptise M.D.   On: 10/14/2018 08:13   Danna Hefty, DO 10/15/2018, 9:59 AM PGY-1, [facility] Intern pager: [phone], text pages welcome

## 2018-10-15 NOTE — Progress Notes (Signed)
Removed patient's mittens at 1800, no issues with pulling on medical devices. Will continue to monitor.

## 2018-10-15 NOTE — Progress Notes (Signed)
Patient ID: Maurice Mckee, male   DOB: Sep 22, 1925, 85 y.o.   MRN: [id number]  IR Round note via phone New regulations Spoke to [place] RN  Procedure 5/25:  CT guided R chest tube placement 1f EBL:  minimal Complications: none immediate  OP 40 cc in chamber No air leak Site c/d/i  98% On RA In no distress  CXR today:  IMPRESSION: 1. Support apparatus, as above. 2. No residual pneumothorax identified. Right-sided chest tube appears stable in position, but there continues to be a small right pleural effusion with some associated atelectasis and/or consolidation throughout the right mid to lower lung. 3. Aortic atherosclerosis.   Will follow Leave to suction for now

## 2018-10-15 NOTE — Progress Notes (Signed)
Physical Therapy Treatment Patient Details Name: Maurice Mckee MRN: [id number] DOB: 10-14-1925 Today's Date: 10/15/2018    History of Present Illness 85 yo male admitted from [facility] s/p fall for R IM nail PMH: L hemiarthroplasty 09/04/18 PNA Dementia    PT Comments    Pt supine in bed on arrival.  He was difficult to rouse and when he did he remains confused due to underlying dementia.  Pt attempted sit to stand with B HHA but due to fear and confusion he was pushing posterior and unable to achieve full stance.  Moved to the sara + lift to achieve sit to stand and pt was able to stand tall to allow for pericare.  After pericare he advanced to 20 ft of gait training.  Pt is slow and guarded and lacks strength in R side.  He required assistance to lift R foot but as gait progressed he was lifting it himself.  Pt is currently requiring mod- max assistance +2 and continues to benefit from skilled rehab in a post acute setting.  SNF remains appropriate based on needs.     Follow Up Recommendations  SNF;Supervision/Assistance - 24 hour     Equipment Recommendations  None recommended by PT    Recommendations for Other Services       Precautions / Restrictions Precautions Precautions: Fall Restrictions Weight Bearing Restrictions: Yes RLE Weight Bearing: Weight bearing as tolerated    Mobility  Bed Mobility Overal bed mobility: Needs Assistance Bed Mobility: Supine to Sit     Supine to sit: +2 for physical assistance     General bed mobility comments: Pt required max assistance to advance LEs to edge of bed and to elevate trunk into sitting.  Pt slightly resistive due to dementia.  Pt able to maintain sitting balance with flexed posture sitting edge of bed.    Transfers Overall transfer level: Needs assistance Equipment used: Ambulation equipment used(sara + lift to achieve standing after failed attempt to stand with 2 person HHA.  ) Transfers: Sit to/from [employer] Sit to Stand: +2 physical assistance;Mod assist;From elevated surface;Total assist(Mod +2 to partially lift bottom, utilized sara + lift to achieve full stance as platforms on both side significantly improved upper trunk control.  ) Stand pivot transfers: +2 physical assistance;Mod assist;From elevated surface       General transfer comment: Pt stood in frame well for perianal care and then with moderate assistance and use of lift able to pivot toward chair but remained standing.  Foot plate removed from lift pre transfer to allow for progression of gait training.  Ambulation/Gait Ambulation/Gait assistance: Mod assist;Max assist;+2 safety/equipment Gait Distance (Feet): 20 Feet Assistive device: (sara + sit to stand lift with B platform and foot plate removed.  ) Gait Pattern/deviations: Step-to pattern;Step-through pattern;Antalgic;Decreased stance time - right;Decreased step length - right     General Gait Details: Pt initially required assistance to advance RLE forward, due to dementia he ambulated with improper sequencing advancing LLE before R and dragging RLE, as gait progressed he achieved a modified step through pattern and was able to progress RLE better.  Close chair follow utilized for safety.     Stairs             Wheelchair Mobility    Modified Rankin (Stroke Patients Only)       Balance Overall balance assessment: Needs assistance Sitting-balance support: Bilateral upper extremity supported;Feet supported Sitting balance-Leahy Scale: Fair  Standing balance-Leahy Scale: Poor Standing balance comment: reliant on sara + frame for standing with BUE support and waist strap.                             Cognition Arousal/Alertness: Awake/alert Behavior During Therapy: WFL for tasks assessed/performed Overall Cognitive Status: History of cognitive impairments - at baseline                                 General  Comments: dementia at baseline      Exercises      General Comments        Pertinent Vitals/Pain Pain Assessment: Faces Faces Pain Scale: Hurts even more Pain Location: R leg Pain Descriptors / Indicators: Operative site guarding;Grimacing Pain Intervention(s): Monitored during session;Repositioned    Home Living                      Prior Function            PT Goals (current goals can now be found in the care plan section) Acute Rehab PT Goals Patient Stated Goal: Unable to formulate Potential to Achieve Goals: Fair Progress towards PT goals: Progressing toward goals    Frequency    Min 3X/week      PT Plan Current plan remains appropriate    Co-evaluation              AM-PAC PT "6 Clicks" Mobility   Outcome Measure  Help needed turning from your back to your side while in a flat bed without using bedrails?: Total Help needed moving from lying on your back to sitting on the side of a flat bed without using bedrails?: Total Help needed moving to and from a bed to a chair (including a wheelchair)?: Total Help needed standing up from a chair using your arms (e.g., wheelchair or bedside chair)?: Total Help needed to walk in hospital room?: Total Help needed climbing 3-5 steps with a railing? : Total 6 Click Score: 6    End of Session Equipment Utilized During Treatment: Gait belt;Oxygen Activity Tolerance: Patient tolerated treatment well Patient left: in chair;with call bell/phone within reach;with chair alarm set Nurse Communication: Mobility status PT Visit Diagnosis: Unsteadiness on feet (R26.81);Other abnormalities of gait and mobility (R26.89);Muscle weakness (generalized) (M62.81)     Time: 4765-4650 PT Time Calculation (min) (ACUTE ONLY): 28 min  Charges:  $Gait Training: 8-22 mins $Therapeutic Activity: 8-22 mins                     Governor Rooks, PTA Acute Rehabilitation Services Pager [phone] Office  [phone]     Kaylan Yates Eli Hose 10/15/2018, 2:37 PM

## 2018-10-15 NOTE — Progress Notes (Signed)
Orders received to place chest tube to water seal. Patient chest tube placed to water seal, tolerated well with no signs of distress. Will continue to monitor closely for remainder of shift.

## 2018-10-16 ENCOUNTER — Inpatient Hospital Stay (HOSPITAL_COMMUNITY): Payer: Medicare Other

## 2018-10-16 DIAGNOSIS — J939 Pneumothorax, unspecified: Secondary | ICD-10-CM

## 2018-10-16 DIAGNOSIS — E43 Unspecified severe protein-calorie malnutrition: Secondary | ICD-10-CM

## 2018-10-16 LAB — CBC
HCT: 26.1 % — ABNORMAL LOW (ref 39.0–52.0)
Hemoglobin: 8.1 g/dL — ABNORMAL LOW (ref 13.0–17.0)
MCH: 31.4 pg (ref 26.0–34.0)
MCHC: 31 g/dL (ref 30.0–36.0)
MCV: 101.2 fL — ABNORMAL HIGH (ref 80.0–100.0)
Platelets: 296 10*3/uL (ref 150–400)
RBC: 2.58 MIL/uL — ABNORMAL LOW (ref 4.22–5.81)
RDW: 14.8 % (ref 11.5–15.5)
WBC: 16.7 10*3/uL — ABNORMAL HIGH (ref 4.0–10.5)
nRBC: 0 % (ref 0.0–0.2)

## 2018-10-16 LAB — BASIC METABOLIC PANEL
Anion gap: 5 (ref 5–15)
BUN: 27 mg/dL — ABNORMAL HIGH (ref 8–23)
CO2: 29 mmol/L (ref 22–32)
Calcium: 8.8 mg/dL — ABNORMAL LOW (ref 8.9–10.3)
Chloride: 108 mmol/L (ref 98–111)
Creatinine, Ser: 0.81 mg/dL (ref 0.61–1.24)
GFR calc Af Amer: >60 mL/min (ref >60)
GFR calc non Af Amer: >60 mL/min (ref >60)
Glucose, Bld: 120 mg/dL — ABNORMAL HIGH (ref 70–99)
Potassium: 4.4 mmol/L (ref 3.5–5.1)
Sodium: 142 mmol/L (ref 135–145)

## 2018-10-16 NOTE — Plan of Care (Signed)
Problem: Clinical Measurements: Goal: Postoperative complications will be avoided or minimized Outcome: Progressing   Problem: Pain Management: Goal: Pain level will decrease Outcome: Progressing   Problem: Nutrition: Goal: Adequate nutrition will be maintained Outcome: Progressing   Problem: Elimination: Goal: Will not experience complications related to bowel motility Outcome: Progressing Goal: Will not experience complications related to urinary retention Outcome: Progressing   Problem: Safety: Goal: Ability to remain free from injury will improve Outcome: Progressing   Problem: Skin Integrity: Goal: Risk for impaired skin integrity will decrease Outcome: Progressing

## 2018-10-16 NOTE — Discharge Summary (Signed)
[facility] Discharge Summary  Patient name: Maurice Mckee Medical record number: [id number] Date of birth: 03-07-1926 Age: 85 y.o. Gender: male Date of Admission: 10/10/2018  Date of Discharge: 10/21/2018 Admitting Physician: Martyn Malay, MD  Primary Care Provider: Patient, No Pcp Per Consultants: Ortho, IR  Indication for Hospitalization: Right hip fracture  Discharge Diagnoses/Problem List:  Right femoral fracture Multiple falls Right Pneumothorax Severe protein calorie malnutrition Advanced dementia HFrEF Pacemaker Acute on chronic anemia HTN HLD  Disposition: SNF  Discharge Condition: stable  Discharge Exam:  General: 85 y.o. male in NAD Cardio: RRR no m/r/g Lungs: CTAB, no wheezing, no rhonchi, no crackles, no IWOB on RA, bandage in place on right chest wall Abdomen: Soft, non-tender to palpation, non-distended, positive bowel sounds Skin: warm and dry Extremities: No edema Neuro: opens eyes during exam, non-verbal   Brief Hospital Course:  Maurice Mckee is a 85 y.o. male with past medical history significant for left hip fracture s/p repair on 09/04/18 (SNF at [facility]), recent history of pneumonia in April, advanced dementia, HFrEF (45-50%), HTN, HLD, and vitamin D deficiency, who presented after a fall several days prior at his SNF and found to have a right femur fracture on imaging. Negative CT head. Patient had intermedullary fixation on 9/21 without complication. He worked well with PT/OT during admission. He was discharged back to [facility] for continued rehab.  On day of discharge, wound was well appearing without signs of infection or bleeding. Return precautions discussed and follow up with ortho arranged.  During admission patient developed an right pneumothorax likely secondary to trauma from his recent surgery. He had a right thoracostomy by IR on 1/94 without complication. Patient had chest tube removed on 5/31. He  was monitored overnight with repeat CXR revealing no pneumothorax.  His dressing was removed on 6/2 while inpatient.  He was discharged in stable condition on room air.   Issues for Follow Up:  1. Patient will need repeat CBC for resolution of leukocytosis.  WBC on discharge 18.8. 2. Patient will need f/u with Ortho 2 weeks after 5/24 for staple removal. 3. Macrocytic anemia during admission.  Consider B12, smear, retic count as outpatient.  Hgb on discharge 9.6. 4. Plavix and ASA restarted on discharge.  Significant Procedures:  Intramedullary nail into right intertrochanter 5/24  Significant Labs and Imaging:  Recent Labs  Lab 10/18/18 0452 10/19/18 0246 10/21/18 0425  WBC 16.9* 18.3* 18.8*  HGB 8.0* 8.3* 9.6*  HCT 25.7* 27.3* 31.7*  PLT 355 389 404*   Recent Labs  Lab 10/17/18 0150 10/18/18 0452 10/19/18 0246 10/20/18 0249 10/21/18 0425  NA 141 139 139 138 139  K 4.5 4.3 4.3 5.5* 4.4  CL 107 104 105 106 105  CO2 27 26 27 25 26   GLUCOSE 113* 112* 113* 110* 105*  BUN 26* 30* 24* 28* 23  CREATININE 0.80 0.86 0.83 0.86 0.75  CALCIUM 8.7* 8.3* 8.7* 8.5* 8.6*    TSH 3.805 EKG:  NSR  Dg Chest 1 View  Result Date: 10/19/2018 CLINICAL DATA:  Status post right chest tube removal today. EXAM: CHEST  1 VIEW COMPARISON:  Single-view of the chest earlier today and 10/18/2018. FINDINGS: Pigtail catheter has been removed from the right chest. No pneumothorax. Right infrahilar opacity is unchanged. Left lung clear. Heart size normal. Aortic atherosclerosis noted. No pleural effusion. AICD in place. IMPRESSION: Negative for pneumothorax after chest tube removal. No other change since the exam earlier today. Electronically Signed  By: Inge Rise M.D.   On: 10/19/2018 11:09   Dg Chest 1 View  Result Date: 10/19/2018 CLINICAL DATA:  Follow-up pneumothorax EXAM: CHEST  1 VIEW COMPARISON:  Chest radiograph from one day prior. FINDINGS: Stable configuration of 2 lead left subclavian  ICD. Right pigtail chest tube in place. Stable cardiomediastinal silhouette with normal heart size. No appreciable residual pneumothorax. No significant pleural effusion. No pulmonary edema. Patchy right infrahilar opacity appears stable. Clear left lung. IMPRESSION: 1. No appreciable residual pneumothorax.  Right chest tube in place. 2. Stable patchy right infrahilar opacity. Continued chest radiograph follow-up advised. Electronically Signed   By: Ilona Sorrel M.D.   On: 10/19/2018 09:57   Dg Chest 1 View  Result Date: 10/10/2018 CLINICAL DATA:  Right hip fracture. EXAM: CHEST  1 VIEW COMPARISON:  Chest x-ray dated September 04, 2018. FINDINGS: Unchanged left chest wall pacemaker. Stable cardiomediastinal silhouette. Normal pulmonary vascularity. No focal consolidation, pleural effusion, or pneumothorax. No acute osseous abnormality. IMPRESSION: No active disease. Electronically Signed   By: Titus Dubin M.D.   On: 10/10/2018 16:16   Ct Head Wo Contrast  Result Date: 10/10/2018 CLINICAL DATA:  Altered level of consciousness. EXAM: CT HEAD WITHOUT CONTRAST TECHNIQUE: Contiguous axial images were obtained from the base of the skull through the vertex without intravenous contrast. COMPARISON:  CT scan of August 13, 2018. FINDINGS: Brain: Mild diffuse cortical atrophy is noted. Mild chronic ischemic white matter disease is noted. No mass effect or midline shift is noted. Ventricular size is within normal limits. There is no evidence of mass lesion, hemorrhage or acute infarction. Vascular: No hyperdense vessel or unexpected calcification. Skull: Normal. Negative for fracture or focal lesion. Sinuses/Orbits: Complete opacification of left maxillary sinus is again noted. Other: None. IMPRESSION: Mild diffuse cortical atrophy. Mild chronic ischemic white matter disease. No acute intracranial abnormality seen. Electronically Signed   By: Marijo Conception M.D.   On: 10/10/2018 13:55   Dg Chest Portable 2  Views  Result Date: 10/13/2018 CLINICAL DATA:  Shortness of breath. EXAM: CHEST  2 VIEW PORTABLE COMPARISON:  Chest x-rays dated 10/10/2018, 09/04/2018 and 08/13/2018 FINDINGS: RIGHT-sided pneumothorax, moderate in size. RIGHT basilar opacity, most likely associated atelectasis. No shift of the mediastinal structures to suggest tension pneumothorax. Heart size and mediastinal contours are stable. LEFT lung is clear. LEFT chest wall pacemaker/ICD apparatus appears stable. Chronic appearing compression deformities of several vertebral bodies at the level of the midthoracic spine. No acute appearing osseous abnormality. IMPRESSION: 1. RIGHT-sided pneumothorax, moderate in size. 2. Probable RIGHT basilar atelectasis. These results were called by telephone at the time of interpretation on 10/13/2018 at 12:47 pm to Dr. Jeannine Kitten, who verbally acknowledged these results. Electronically Signed   By: Franki Cabot M.D.   On: 10/13/2018 12:51   Dg Chest Port 1 View  Result Date: 10/18/2018 CLINICAL DATA:  Pneumothorax EXAM: PORTABLE CHEST 1 VIEW COMPARISON:  Chest radiograph from one day prior. FINDINGS: Stable configuration of 2 lead left subclavian ICD. Stable right basilar pigtail chest tube. Stable cardiomediastinal silhouette with normal heart size. Tiny right apical pneumothorax, less than 5%, not definitely seen on prior. No left pneumothorax. No pleural effusion. Patchy lower right lung opacity is similar. No pulmonary edema. No mediastinal shift. IMPRESSION: 1. Tiny right apical pneumothorax, less than 5%, not definitely seen on prior. Right pigtail chest tube in place. No mediastinal shift. 2. Stable patchy lower right lung opacity. Electronically Signed   By: Janina Mayo.D.  On: 10/18/2018 09:46   Dg Chest Port 1 View  Result Date: 10/17/2018 CLINICAL DATA:  85 year old male with history of pneumothorax. Chest tube evaluation. EXAM: PORTABLE CHEST 1 VIEW COMPARISON:  Chest x-ray 10/16/2018. FINDINGS: Small  bore right-sided chest tube has shifted in position, now with the pigtail in place projecting over the mid right hemithorax. No appreciable right-sided pneumothorax is identified on today's examination. There continues to be some subcutaneous emphysema in the right lateral chest wall. Lung volumes are low. Irregular opacities in the right mid to lower lung may reflect areas of atelectasis and/or consolidation. Overall, aeration appears slightly improved. Left lung is clear. No pleural effusions. No evidence of pulmonary edema. Heart size is mildly enlarged. Upper mediastinal contours are within normal limits. Aortic atherosclerosis. Left-sided pacemaker/AICD with lead tips projecting over the expected location of the right atrium and right ventricular apex. IMPRESSION: 1. Support apparatus, as above. 2. No definite residual pneumothorax. 3. Ill-defined opacities in the right mid to lower lung may reflect areas of residual atelectasis and/or consolidation, with slight improving aeration. 4. Mild cardiomegaly. 5. Aortic atherosclerosis. Electronically Signed   By: Vinnie Langton M.D.   On: 10/17/2018 07:50   Dg Chest Port 1 View  Result Date: 10/16/2018 CLINICAL DATA:  Chest tube placement.  Pleural effusion. EXAM: PORTABLE CHEST 1 VIEW COMPARISON:  July 17, 2018 FINDINGS: Chest tube remains on the right, currently with the pigtail more laterally and inferiorly position than 1 day prior. There is a minimal right apical pneumothorax. There is a small amount of residual pleural effusion on the right, less than on 1 day prior. There is atelectatic change in the right base. The left lung is clear. Heart is upper normal in size with pulmonary vascularity normal. Pacemaker leads are attached to the right atrium and right ventricle. There is aortic atherosclerosis. No adenopathy. Bones are osteoporotic. IMPRESSION: Chest tube remains on the right, although the pigtail is more inferiorly and laterally positioned  than on 1 day prior. There is a minimal right apical pneumothorax. There is a small residual right effusion with right base atelectasis. Left lung clear. Stable cardiac silhouette. Aortic Atherosclerosis (ICD10-I70.0). Electronically Signed   By: Lowella Grip III M.D.   On: 10/16/2018 07:53   Dg Chest Port 1 View  Result Date: 10/15/2018 CLINICAL DATA:  85 year old male with history of pneumothorax. Chest tube placed. EXAM: PORTABLE CHEST 1 VIEW COMPARISON:  Chest x-ray 10/14/2018. FINDINGS: Right-sided chest tube in position with pigtail reformed in the medial aspect of the right hemithorax. No appreciable pneumothorax. Small to moderate right pleural effusion, small right pleural effusion, similar to the prior examination. Irregular opacities in the right mid to lower lung may reflect associated atelectasis and/or airspace consolidation. Left lung is clear. No left pleural effusion. No evidence of pulmonary edema. Heart size is upper limits of normal. Upper mediastinal contours are within normal limits. Aortic atherosclerosis. Left-sided pacemaker/AICD with leads projecting over the expected location of the right atrium and right ventricle. IMPRESSION: 1. Support apparatus, as above. 2. No residual pneumothorax identified. Right-sided chest tube appears stable in position, but there continues to be a small right pleural effusion with some associated atelectasis and/or consolidation throughout the right mid to lower lung. 3. Aortic atherosclerosis. Electronically Signed   By: Vinnie Langton M.D.   On: 10/15/2018 08:27   Dg Chest Port 1 View  Result Date: 10/14/2018 CLINICAL DATA:  Pneumothorax, chest tube EXAM: PORTABLE CHEST 1 VIEW COMPARISON:  10/13/2018 FINDINGS: Right chest  tube has been placed with re-expansion of the right lung. No visible pneumothorax currently. Right lower lobe consolidation. Left lung clear. Heart is mildly enlarged. Left AICD is unchanged. IMPRESSION: Interval placement of  right chest tube with re-expansion of the right lung. No visible pneumothorax. Right lower lobe consolidation. Cardiomegaly. Electronically Signed   By: Rolm Baptise M.D.   On: 10/14/2018 08:13   Dg C-arm 1-60 Min  Result Date: 10/12/2018 CLINICAL DATA:  Open reduction internal fixation for fracture EXAM: OPERATIVE RIGHT HIP  2 VIEWS TECHNIQUE: Fluoroscopic spot image(s) were submitted for interpretation post-operatively. COMPARISON:  Preoperative right hip region radiographs Oct 10, 2018 FLUOROSCOPY TIME:  0 minutes 53 seconds; 6 acquired images FINDINGS: Frontal and lateral views obtained. There is screw and nail fixation through a fracture of the intertrochanteric portion of the proximal right femur. The tip of the screws in the proximal femoral head. There is avulsion of the lesser trochanter, unchanged from preoperative study. No dislocation. Right hip joint appears intact. IMPRESSION: Screw and nail fixation through comminuted fracture of the proximal right intertrochanteric region. Persistent avulsion lesser trochanter. No dislocation. Tip of screw in proximal femoral head. Electronically Signed   By: Lowella Grip III M.D.   On: 10/12/2018 12:16   Dg Hip Operative Unilat W Or W/o Pelvis Right  Result Date: 10/12/2018 CLINICAL DATA:  Open reduction internal fixation for fracture EXAM: OPERATIVE RIGHT HIP  2 VIEWS TECHNIQUE: Fluoroscopic spot image(s) were submitted for interpretation post-operatively. COMPARISON:  Preoperative right hip region radiographs Oct 10, 2018 FLUOROSCOPY TIME:  0 minutes 53 seconds; 6 acquired images FINDINGS: Frontal and lateral views obtained. There is screw and nail fixation through a fracture of the intertrochanteric portion of the proximal right femur. The tip of the screws in the proximal femoral head. There is avulsion of the lesser trochanter, unchanged from preoperative study. No dislocation. Right hip joint appears intact. IMPRESSION: Screw and nail fixation  through comminuted fracture of the proximal right intertrochanteric region. Persistent avulsion lesser trochanter. No dislocation. Tip of screw in proximal femoral head. Electronically Signed   By: Lowella Grip III M.D.   On: 10/12/2018 12:16   Dg Hip Unilat With Pelvis 2-3 Views Left  Result Date: 10/10/2018 CLINICAL DATA:  Right hip fracture. [place] out of a wheelchair 2 days ago. EXAM: DG HIP (WITH OR WITHOUT PELVIS) 2-3V LEFT COMPARISON:  Pelvic x-ray dated September 05, 2018. FINDINGS: Acute comminuted, displaced, and angulated right intertrochanteric femur fracture. No dislocation. Unchanged left hip hemiarthroplasty. No evidence of hardware failure or loosening. Old fracture of the right inferior pubic ramus again noted. The pubic symphysis and sacroiliac joints are intact. Osteopenia. Soft tissues are unremarkable. IMPRESSION: 1. Acute right intertrochanteric femur fracture. 2. Unchanged left hip hemiarthroplasty without hardware complication. Electronically Signed   By: Titus Dubin M.D.   On: 10/10/2018 16:15   Dg Hip Unilat With Pelvis 2-3 Views Right  Result Date: 10/10/2018 CLINICAL DATA:  Fall, hip fracture EXAM: DG HIP (WITH OR WITHOUT PELVIS) 2-3V RIGHT COMPARISON:  10/10/2018 FINDINGS: There is an acute displaced and angulated right hip intertrochanteric fracture. Bones are osteopenic. No subluxation or dislocation. Peripheral atherosclerosis noted. Remote right inferior ramus fracture with healed deformity. IMPRESSION: Acute displaced and angulated right hip intertrochanteric fracture. Electronically Signed   By: Jerilynn Mages.  Shick M.D.   On: 10/10/2018 17:17   Ct Perc Pleural Drain W/indwell Cath W/img Guide  Result Date: 10/13/2018 CLINICAL DATA:  Hypoxia. Right pneumothorax noted. Recent anesthesia for fixation of  right femur neck fracture. EXAM: CT GUIDED CHEST TUBE PLACEMENT ANESTHESIA/SEDATION: Intravenous Fentanyl 48mcg and Versed 0.5mg  were administered as conscious sedation during  continuous monitoring of the patient's level of consciousness and physiological / cardiorespiratory status by the radiology RN, with a total moderate sedation time of 13 minutes. PROCEDURE: The procedure, risks, benefits, and alternatives were explained to the son. Questions regarding the procedure were encouraged and answered. The son understands and consents to the procedure. Select scans through the thorax were obtained. The right pneumothorax was localized and an appropriate skin entry site was determined and marked. The operative field was prepped with chlorhexidinein a sterile fashion, and a sterile drape was applied covering the operative field. A sterile gown and sterile gloves were used for the procedure. Local anesthesia was provided with 1% Lidocaine. Under CT guidance, a 19 gauge percutaneous entry needle was advanced into the right pleural space. Gas could be aspirated. An Amplatz wire advanced centrally easily. Tract was dilated to facilitate placement of a 14 French pigtail catheter. CT confirms good position with evacuation of the pneumothorax. The catheter was secured externally with 0 Prolene suture and StatLock, and placed to Pleur-evac -20 cm H2O suction. The patient tolerated the procedure well. COMPLICATIONS: None immediate FINDINGS: Simple appearing moderate right pneumothorax was localized. 14 French pigtail chest tube placed as above. IMPRESSION: 1. Technically successful CT-guided right chest tube placement Electronically Signed   By: Lucrezia Europe M.D.   On: 10/13/2018 14:57    Results/Tests Pending at Time of Discharge: None  Discharge Medications:  Allergies as of 10/21/2018      Reactions   Lactose Intolerance (gi) Other (See Comments)   Unknown reaction      Medication List    STOP taking these medications   diphenoxylate-atropine 2.5-0.025 MG tablet Commonly known as:  LOMOTIL   donepezil 10 MG tablet Commonly known as:  ARICEPT   fluticasone 50 MCG/ACT nasal  spray Commonly known as:  FLONASE   memantine 5 MG tablet Commonly known as:  NAMENDA   Mucinex Maximum Strength 1200 MG Tb12 Generic drug:  Guaifenesin     TAKE these medications   acetaminophen 325 MG tablet Commonly known as:  TYLENOL Take 650 mg by mouth every 6 (six) hours as needed (pain, fever >101).   aspirin EC 325 MG tablet Take 325 mg by mouth daily.   atorvastatin 40 MG tablet Commonly known as:  LIPITOR Take 40 mg by mouth daily.   carvedilol 12.5 MG tablet Commonly known as:  COREG Take 12.5 mg by mouth 2 (two) times daily with a meal.   cholecalciferol 1000 units tablet Commonly known as:  VITAMIN D Take 1,000 Units by mouth daily.   clopidogrel 75 MG tablet Commonly known as:  PLAVIX Take 75 mg by mouth daily.   docusate sodium 100 MG capsule Commonly known as:  COLACE Take 1 capsule (100 mg total) by mouth 2 (two) times daily.   ferrous sulfate 325 (65 FE) MG tablet Take 325 mg by mouth daily.   furosemide 20 MG tablet Commonly known as:  LASIX Take 20 mg by mouth daily.   lactose free nutrition Liqd Take 237 mLs by mouth 2 (two) times daily between meals.   multivitamin with minerals Tabs tablet Take 1 tablet by mouth daily. Start taking on:  October 22, 2018            Durable Medical Equipment  (From admission, onward)         Start  Ordered   10/14/18 1316  For home use only DME wheelchair cushion (seat and back)  Once     10/14/18 1315   10/14/18 1315  For home use only DME Bedside commode  Once    Question Answer Comment  Patient needs a bedside commode to treat with the following condition Weakness   Patient needs a bedside commode to treat with the following condition Hip fracture [facility])      10/14/18 1315          Discharge Instructions: Please refer to Patient Instructions section of EMR for full details.  Patient was counseled important signs and symptoms that should prompt return to medical care, changes in  medications, dietary instructions, activity restrictions, and follow up appointments.   Follow-Up Appointments:  Contact information for follow-up providers    Nicholes Stairs, MD In 2 weeks.   Specialty:  Orthopedic Surgery Why:  For suture removal Contact information: [address] [place] [address] [phone]            Contact information for after-discharge care    [facility] .   Service:  Skilled Nursing Contact information: [address] [place] [phone]                  Cleophas Dunker, DO 10/21/2018, 12:39 PM PGY-1, [place]

## 2018-10-16 NOTE — Progress Notes (Signed)
Physical Therapy Treatment Patient Details Name: Maurice Mckee MRN: [id number] DOB: 13-Sep-1925 Today's Date: 10/16/2018    History of Present Illness 85 yo male admitted from [facility] s/p fall for R IM nail PMH: L hemiarthroplasty 09/04/18 PNA Dementia    PT Comments    Pt performed progression to standing in RW with posterior bias and moderate assistance +2.  To move from bed to recliner chair he require max +2 and chair to be brought to him as he began to sit prematurely.  Pt continues to benefit from skilled rehab in a post acute setting to improve strength and function.  Plan for progression of gait next session with RW to tolerance.    Follow Up Recommendations  SNF;Supervision/Assistance - 24 hour     Equipment Recommendations  None recommended by PT    Recommendations for Other Services       Precautions / Restrictions Precautions Precautions: Fall Restrictions Weight Bearing Restrictions: Yes RLE Weight Bearing: Weight bearing as tolerated    Mobility  Bed Mobility Overal bed mobility: Needs Assistance Bed Mobility: Supine to Sit     Supine to sit: +2 for physical assistance;Mod assist     General bed mobility comments: Pt more receptive to movement he was able to follow commands and maintain sitting balance at edge of bed.   Transfers Overall transfer level: Needs assistance Equipment used: Rolling walker (2 wheeled) Transfers: Sit to/from Stand Sit to Stand: Mod assist;+2 physical assistance         General transfer comment: Pt flexed in standing with B UE support on RW.    Ambulation/Gait Ambulation/Gait assistance: Max assist;+2 physical assistance Gait Distance (Feet): 4 Feet(steps from bed to recliner chair.  ) Assistive device: Rolling walker (2 wheeled) Gait Pattern/deviations: Step-to pattern;Shuffle;Antalgic;Narrow base of support     General Gait Details: Pt required series of sidestepping, backward and forward steps to move from  bed to recliner chair.  Pt started to sit prematurely and required chair to be brought to him.     Stairs             Wheelchair Mobility    Modified Rankin (Stroke Patients Only)       Balance Overall balance assessment: Needs assistance Sitting-balance support: Bilateral upper extremity supported;Feet supported Sitting balance-Leahy Scale: Fair       Standing balance-Leahy Scale: Poor                              Cognition Arousal/Alertness: Awake/alert Behavior During Therapy: WFL for tasks assessed/performed Overall Cognitive Status: History of cognitive impairments - at baseline                                 General Comments: dementia at baseline      Exercises      General Comments        Pertinent Vitals/Pain Pain Assessment: Faces Faces Pain Scale: Hurts even more Pain Location: R leg Pain Descriptors / Indicators: Operative site guarding;Grimacing Pain Intervention(s): Monitored during session;Repositioned    Home Living                      Prior Function            PT Goals (current goals can now be found in the care plan section) Acute Rehab PT Goals Patient Stated Goal: To  comb his hair Potential to Achieve Goals: Fair Progress towards PT goals: Progressing toward goals    Frequency    Min 3X/week      PT Plan Current plan remains appropriate    Co-evaluation              AM-PAC PT "6 Clicks" Mobility   Outcome Measure  Help needed turning from your back to your side while in a flat bed without using bedrails?: A Lot Help needed moving from lying on your back to sitting on the side of a flat bed without using bedrails?: A Lot Help needed moving to and from a bed to a chair (including a wheelchair)?: A Lot Help needed standing up from a chair using your arms (e.g., wheelchair or bedside chair)?: A Lot Help needed to walk in hospital room?: Total Help needed climbing 3-5 steps with  a railing? : Total 6 Click Score: 10    End of Session Equipment Utilized During Treatment: Gait belt Activity Tolerance: Patient tolerated treatment well Patient left: in chair;with call bell/phone within reach;with chair alarm set Nurse Communication: Mobility status PT Visit Diagnosis: Unsteadiness on feet (R26.81);Other abnormalities of gait and mobility (R26.89);Muscle weakness (generalized) (M62.81)     Time: 3582-5189 PT Time Calculation (min) (ACUTE ONLY): 20 min  Charges:  $Therapeutic Activity: 8-22 mins                     Governor Rooks, PTA Acute Rehabilitation Services Pager [phone] Office [place] 10/16/2018, 6:00 PM

## 2018-10-16 NOTE — Progress Notes (Signed)
Per pt's son, [name]'s, request, Family Medicine intern paged at [phone] to call him to update on pt's medical status. Will continue to monitor.

## 2018-10-16 NOTE — Progress Notes (Signed)
Referring Physician(s): * No referring provider recorded for this case *  Supervising Physician: Daryll Brod  Patient Status:  [facility] - In-pt  Chief Complaint: Pneumothorax s/p chest tube placement 10/13/18  Subjective: Resting comfortably.  On room air.  Awakens easily, but does not follow commands due to confusion.  Right chest tube in place.  Allergies: Lactose intolerance (gi)  Medications: Prior to Admission medications   Medication Sig Start Date End Date Taking? Authorizing Provider  acetaminophen (TYLENOL) 325 MG tablet Take 650 mg by mouth every 6 (six) hours as needed (pain, fever >101).   Yes [provider]  aspirin EC 325 MG tablet Take 325 mg by mouth daily.   Yes [provider]  atorvastatin (LIPITOR) 40 MG tablet Take 40 mg by mouth daily.  11/09/16  Yes [provider]  carvedilol (COREG) 12.5 MG tablet Take 12.5 mg by mouth 2 (two) times daily with a meal.  11/09/16  Yes [provider]  cholecalciferol (VITAMIN D) 1000 units tablet Take 1,000 Units by mouth daily.   Yes [provider]  clopidogrel (PLAVIX) 75 MG tablet Take 75 mg by mouth daily.  11/09/16  Yes [provider]  diphenoxylate-atropine (LOMOTIL) 2.5-0.025 MG tablet Take 2 tablets by mouth every 6 (six) hours as needed for diarrhea or loose stools.   Yes [provider]  donepezil (ARICEPT) 10 MG tablet Take 10 mg by mouth at bedtime.   Yes [provider]  ferrous sulfate 325 (65 FE) MG tablet Take 325 mg by mouth daily.   Yes [provider]  fluticasone (FLONASE) 50 MCG/ACT nasal spray Place 2 sprays into both nostrils daily.   Yes [provider]  furosemide (LASIX) 20 MG tablet Take 20 mg by mouth daily.    Yes [provider]  Guaifenesin (MUCINEX MAXIMUM STRENGTH) 1200 MG TB12 Take 1,200 mg by mouth every 12 (twelve) hours.   Yes [provider]  memantine (NAMENDA) 5 MG tablet Take 5  mg by mouth 2 (two) times daily with a meal.   Yes [provider]     Vital Signs: BP (!) 147/68    Pulse 90    Temp 98.4 F (36.9 C) (Oral)    Resp 17    Ht 5\' 11"  (1.803 m)    Wt 140 lb 3.4 oz (63.6 kg)    SpO2 98%    BMI 19.56 kg/m   Physical Exam Vitals signs and nursing note reviewed.   NAD, alert Chest:  Tube intact.  Not kinked. Insertion site intact. 100 mL of yellow output since yesterday's marking- now at 350 mL. Sealed. No air leak.   Imaging: Dg Chest Portable 2 Views  Result Date: 10/13/2018 CLINICAL DATA:  Shortness of breath. EXAM: CHEST  2 VIEW PORTABLE COMPARISON:  Chest x-rays dated 10/10/2018, 09/04/2018 and 08/13/2018 FINDINGS: RIGHT-sided pneumothorax, moderate in size. RIGHT basilar opacity, most likely associated atelectasis. No shift of the mediastinal structures to suggest tension pneumothorax. Heart size and mediastinal contours are stable. LEFT lung is clear. LEFT chest wall pacemaker/ICD apparatus appears stable. Chronic appearing compression deformities of several vertebral bodies at the level of the midthoracic spine. No acute appearing osseous abnormality. IMPRESSION: 1. RIGHT-sided pneumothorax, moderate in size. 2. Probable RIGHT basilar atelectasis. These results were called by telephone at the time of interpretation on 10/13/2018 at 12:47 pm to Dr. Jeannine Kitten, who verbally acknowledged these results. Electronically Signed   By: Franki Cabot M.D.   On:  10/13/2018 12:51   Dg Chest Port 1 View  Result Date: 10/16/2018 CLINICAL DATA:  Chest tube placement.  Pleural effusion. EXAM: PORTABLE CHEST 1 VIEW COMPARISON:  July 17, 2018 FINDINGS: Chest tube remains on the right, currently with the pigtail more laterally and inferiorly position than 1 day prior. There is a minimal right apical pneumothorax. There is a small amount of residual pleural effusion on the right, less than on 1 day prior. There is atelectatic change in the right base. The left lung is  clear. Heart is upper normal in size with pulmonary vascularity normal. Pacemaker leads are attached to the right atrium and right ventricle. There is aortic atherosclerosis. No adenopathy. Bones are osteoporotic. IMPRESSION: Chest tube remains on the right, although the pigtail is more inferiorly and laterally positioned than on 1 day prior. There is a minimal right apical pneumothorax. There is a small residual right effusion with right base atelectasis. Left lung clear. Stable cardiac silhouette. Aortic Atherosclerosis (ICD10-I70.0). Electronically Signed   By: Lowella Grip III M.D.   On: 10/16/2018 07:53   Dg Chest Port 1 View  Result Date: 10/15/2018 CLINICAL DATA:  85 year old male with history of pneumothorax. Chest tube placed. EXAM: PORTABLE CHEST 1 VIEW COMPARISON:  Chest x-ray 10/14/2018. FINDINGS: Right-sided chest tube in position with pigtail reformed in the medial aspect of the right hemithorax. No appreciable pneumothorax. Small to moderate right pleural effusion, small right pleural effusion, similar to the prior examination. Irregular opacities in the right mid to lower lung may reflect associated atelectasis and/or airspace consolidation. Left lung is clear. No left pleural effusion. No evidence of pulmonary edema. Heart size is upper limits of normal. Upper mediastinal contours are within normal limits. Aortic atherosclerosis. Left-sided pacemaker/AICD with leads projecting over the expected location of the right atrium and right ventricle. IMPRESSION: 1. Support apparatus, as above. 2. No residual pneumothorax identified. Right-sided chest tube appears stable in position, but there continues to be a small right pleural effusion with some associated atelectasis and/or consolidation throughout the right mid to lower lung. 3. Aortic atherosclerosis. Electronically Signed   By: Vinnie Langton M.D.   On: 10/15/2018 08:27   Dg Chest Port 1 View  Result Date: 10/14/2018 CLINICAL DATA:   Pneumothorax, chest tube EXAM: PORTABLE CHEST 1 VIEW COMPARISON:  10/13/2018 FINDINGS: Right chest tube has been placed with re-expansion of the right lung. No visible pneumothorax currently. Right lower lobe consolidation. Left lung clear. Heart is mildly enlarged. Left AICD is unchanged. IMPRESSION: Interval placement of right chest tube with re-expansion of the right lung. No visible pneumothorax. Right lower lobe consolidation. Cardiomegaly. Electronically Signed   By: Rolm Baptise M.D.   On: 10/14/2018 08:13   Ct Perc Pleural Drain W/indwell Cath W/img Guide  Result Date: 10/13/2018 CLINICAL DATA:  Hypoxia. Right pneumothorax noted. Recent anesthesia for fixation of right femur neck fracture. EXAM: CT GUIDED CHEST TUBE PLACEMENT ANESTHESIA/SEDATION: Intravenous Fentanyl 48mcg and Versed 0.5mg  were administered as conscious sedation during continuous monitoring of the patient's level of consciousness and physiological / cardiorespiratory status by the radiology RN, with a total moderate sedation time of 13 minutes. PROCEDURE: The procedure, risks, benefits, and alternatives were explained to the son. Questions regarding the procedure were encouraged and answered. The son understands and consents to the procedure. Select scans through the thorax were obtained. The right pneumothorax was localized and an appropriate skin entry site was determined and marked. The operative field was prepped with chlorhexidinein a sterile fashion, and  a sterile drape was applied covering the operative field. A sterile gown and sterile gloves were used for the procedure. Local anesthesia was provided with 1% Lidocaine. Under CT guidance, a 19 gauge percutaneous entry needle was advanced into the right pleural space. Gas could be aspirated. An Amplatz wire advanced centrally easily. Tract was dilated to facilitate placement of a 14 French pigtail catheter. CT confirms good position with evacuation of the pneumothorax. The catheter  was secured externally with 0 Prolene suture and StatLock, and placed to Pleur-evac -20 cm H2O suction. The patient tolerated the procedure well. COMPLICATIONS: None immediate FINDINGS: Simple appearing moderate right pneumothorax was localized. 14 French pigtail chest tube placed as above. IMPRESSION: 1. Technically successful CT-guided right chest tube placement Electronically Signed   By: Lucrezia Europe M.D.   On: 10/13/2018 14:57    Labs:  CBC: Recent Labs    10/13/18 0314 10/14/18 0850 10/15/18 0209 10/16/18 0143  WBC 17.5* 18.4* 16.8* 16.7*  HGB 8.1* 7.8* 8.0* 8.1*  HCT 26.0* 24.7* 25.8* 26.1*  PLT 227 187 240 296    COAGS: Recent Labs    10/10/18 1300 10/11/18 0327  INR 1.2 1.2  APTT 36  --     BMP: Recent Labs    10/13/18 0314 10/14/18 1413 10/15/18 0209 10/16/18 0143  NA 139 140 141 142  K 4.7 4.0 4.4 4.4  CL 104 105 105 108  CO2 26 28 28 29   GLUCOSE 147* 136* 109* 120*  BUN 32* 33* 31* 27*  CALCIUM 8.3* 8.5* 8.4* 8.8*  CREATININE 0.93 0.81 0.81 0.81  GFRNONAA >60 >60 >60 >60  GFRAA >60 >60 >60 >60    LIVER FUNCTION TESTS: Recent Labs    10/10/18 1300 10/11/18 0327  BILITOT 0.7 0.8  AST 18 15  ALT 19 18  ALKPHOS 126 116  PROT 5.4* 4.7*  ALBUMIN 2.6* 2.2*    Assessment and Plan: Right pneumothorax s/p chest tube placement 5/25 by Dr. Vernard Gambles Patient resting well on room air.  Tube intact. No air leak. Fluid level at 350 mL today.  CXR reviewed by Dr. Annamaria Boots; worsening of PTX compared to yesterday. Placed back to 20 mm suction.   Repeat CXR tomorrow.   Electronically Signed: Docia Barrier, PA 10/16/2018, 1:55 PM   I spent a total of 15 Minutes at the the patient's bedside AND on the patient's hospital floor or unit, greater than 50% of which was counseling/coordinating care for pneumothorax.

## 2018-10-16 NOTE — Progress Notes (Signed)
Family Medicine Teaching Service Daily Progress Note Intern Pager: [phone]  Patient name: Maurice Mckee Medical record number: [id number] Date of birth: [birth year] Age: 85 y.o. Gender: male  Primary Care Provider: Patient, No Pcp Per Consultants: Ortho, IR Code Status: Full   Pt Overview and Major Events to Date:  5/22 admitted for R hip fracture 5/24 - IM fixiation of right femur 5/25 - Right Pneumothorax s/p right chest tube placement  Assessment and Plan: Maurice Mckee is a 85 y.o. male presenting from SNF after right hip fracture. PMH is significant for left hip fracture s/p repair on 09/04/18 (SNF at [facility]), recent h/o PNA in April, severe dementia, CHF (EF 45-50% on 04/13/18) , HTN,  HLD, and vitamin D deficiency  Right Pneumothorax s/p right thoracostomy: S/p placement CT guided right thoracostomy on 5/25.  Signs of infection, air leaks, or bleeds.  Breathing comfortably on room air.  Chest tube remains on waterseal overnight, repeat chest x-ray this morning shows minimal right apical pneumothorax. - follow up IR recs for management of chest tube - follow up daily chest x-rays  Right femoral fracture s/p IM  H/o Multiple Falls: stable Denies any pain.  No signs of infection, bleeding, or hematoma.  Bandage clean and intact. - Per Orthopedics WBAT, wound care, and follow up in 2 wks for staple removal, ice to hip  - Resume ASA/plavix on discharge   - Tylenol and tramadol PRN for pain  - Continue lovenox and SCDs for DVT prophylaxis - PT/OT- recommend SNF, 3n1 bedside commode, wheelchair with cushion  Anemia: stable Hgb stable at 8. BL 12-13. - daily CBC - cont home iron  HFrEF (45-50%)  Pacemaker: Continues to appear euvolemic on exam. 248mL UOP overnight. - continue home Coreg 12.5mg  BID - restart asa, plavix on discharge - Continue home lasix - daily BMP - monitor fluid status  Advanced Dementia: Home meds: Aricept and Namenda - hold home meds, may  stop during this hospital stay given patient likely not benefiting from these medications  Severe Protein calorie malnutrition: Albumin 2.6. Cachetic appearing on exam.  - Nutrition consulted, appreciate recs - MVTI, Ensure BID, Hormel shake TID with meals, Magic Cup TID w/ meals - feeding assistance  FEN/GI: ADAT Prophylaxis: SCDs  Disposition: SNF   Subjective:  Sitting up and conversing.  Doing very well this morning.  Denies any pain or shortness of breath.  Tolerating p.o.  No acute events overnight.    Objective: Temp:  [97.7 F (36.5 C)-98.4 F (36.9 C)] 98.4 F (36.9 C) (05/28 0500) Pulse Rate:  [72-92] 72 (05/28 0500) Resp:  [16-17] 17 (05/28 0500) BP: (122-129)/(55-70) 122/70 (05/28 0500) SpO2:  [92 %-98 %] 98 % (05/28 0500) Physical Exam: General: Elderly male, cachectic appearing, NAD, sitting up comfortably in bed, conversing easily with me and nurses  HEENT: normocephalic, atraumatic, moist mucous membranes, minimal dentition Neck: supple, normal ROM CV: regular rate and rhythm without murmurs, rubs, or gallops, no lower extremity edema, plus radial and pedal pulses bilaterally, SCDs in place Lungs: Airflow throughout with mild crackles in the right lower lobe, normal work of breathing on room air Abdomen: soft, non-tender, non-distended, normoactive bowel sounds Skin: warm, dry, right hip incisions well-appearing without signs of erythema or infection, no increased warmth, edges clean and intact left hip incision very well-appearing, healing well without signs of infection, bleeding, drainage Extremities: warm and well perfused Neuro: Alert and oriented, speech normal  Laboratory: Recent Labs  Lab 10/14/18 0850 10/15/18 0209 10/16/18  0143  WBC 18.4* 16.8* 16.7*  HGB 7.8* 8.0* 8.1*  HCT 24.7* 25.8* 26.1*  PLT 187 240 296   Recent Labs  Lab 10/10/18 1300 10/11/18 0327  10/14/18 1413 10/15/18 0209 10/16/18 0143  NA 141 138   < > 140 141 142  K 4.2  4.1   < > 4.0 4.4 4.4  CL 107 107   < > 105 105 108  CO2 25 25   < > 28 28 29   BUN 17 17   < > 33* 31* 27*  CREATININE 0.94 0.80   < > 0.81 0.81 0.81  CALCIUM 8.6* 8.2*   < > 8.5* 8.4* 8.8*  PROT 5.4* 4.7*  --   --   --   --   BILITOT 0.7 0.8  --   --   --   --   ALKPHOS 126 116  --   --   --   --   ALT 19 18  --   --   --   --   AST 18 15  --   --   --   --   GLUCOSE 136* 119*   < > 136* 109* 120*   < > = values in this interval not displayed.    TSH 3.805  EKG NSR  Iron/TIBC/Ferritin/ %Sat    Component Value Date/Time   IRON 16 (L) 10/11/2018 0327   TIBC 176 (L) 10/11/2018 0327   FERRITIN 393 (H) 10/11/2018 0327   IRONPCTSAT 9 (L) 10/11/2018 0327     Imaging/Diagnostic Tests: Dg Chest Port 1 View  Result Date: 10/15/2018 CLINICAL DATA:  85 year old male with history of pneumothorax. Chest tube placed. EXAM: PORTABLE CHEST 1 VIEW COMPARISON:  Chest x-ray 10/14/2018. FINDINGS: Right-sided chest tube in position with pigtail reformed in the medial aspect of the right hemithorax. No appreciable pneumothorax. Small to moderate right pleural effusion, small right pleural effusion, similar to the prior examination. Irregular opacities in the right mid to lower lung may reflect associated atelectasis and/or airspace consolidation. Left lung is clear. No left pleural effusion. No evidence of pulmonary edema. Heart size is upper limits of normal. Upper mediastinal contours are within normal limits. Aortic atherosclerosis. Left-sided pacemaker/AICD with leads projecting over the expected location of the right atrium and right ventricle. IMPRESSION: 1. Support apparatus, as above. 2. No residual pneumothorax identified. Right-sided chest tube appears stable in position, but there continues to be a small right pleural effusion with some associated atelectasis and/or consolidation throughout the right mid to lower lung. 3. Aortic atherosclerosis. Electronically Signed   By: Vinnie Langton M.D.    On: 10/15/2018 08:27   Danna Hefty, DO 10/16/2018, 7:53 AM PGY-1, [place] Intern pager: [phone], text pages welcome

## 2018-10-16 NOTE — Progress Notes (Signed)
Nutrition Follow-up  DOCUMENTATION CODES:   Severe malnutrition in context of chronic illness  INTERVENTION:   -Continue MVI with minerals daily -Continue Ensure Surgery BID, each supplement provides 330 kcals and 18 grams protein; also provides 4.2 grams arginine and 1.1 grams EPA & DHA (omega-3 fatty acids) to support post-operative healing -Hormel Shake TID with meals, each supplement provides 520 kcals and 22 grams protein -Magic Cup TID with meals, each supplement provides 290 kcals and 9 grams protein -Feeding assistance with meals  NUTRITION DIAGNOSIS:   Severe Malnutrition related to chronic illness(advanced dementia) as evidenced by severe fat depletion, moderate muscle depletion, severe muscle depletion.  Ongoing  GOAL:   Patient will meet greater than or equal to 90% of their needs  Progressing  MONITOR:   PO intake, Supplement acceptance, Diet advancement, Labs, Weight trends, Skin, I & O's  REASON FOR ASSESSMENT:   Consult, Malnutrition Screening Tool Hip fracture protocol  ASSESSMENT:   85 y/o male PMHx advanced dementia, HF, HTN, HLD. Has been at [facility] for [facility] after a recent L hip fx s/p hemiarthroplasty 4/17. Presents with R hip pain after falling again. Hip Xray confirmed fracture. Admitted for potential surgical intervention  5/24- s/p PROCEDURE: Treatment of intertrochanteric, pertrochanteric, subtrochanteric fracture with intramedullary implant. CPT [phone]  5/25- rt chest tube placement due to spontaneous pneumothorax 5/26- lt hip staples removed  Reviewed I/O's: -310 ml x 24 hours and -367 ml x 24 hours  UOP: 250 ml x 24 hours  Chest tube output: 120 ml x 24 hours  Case discussed with nurse tech, who reports pt is confused, but cooperative.   Observed Boost Breeze supplement on counter, which pt consumed about 25%.   Intake remains variable (nutritonal services delivered tray after RD visit). Noted meal completion 50-80%. Pt is accepting of  supplements per MAR.   Per CSW notes, plan to d/c back to SNF at discharge.   Labs reviewed.   NUTRITION - FOCUSED PHYSICAL EXAM:    Most Recent Value  Orbital Region  Severe depletion  Upper Arm Region  Severe depletion  Thoracic and Lumbar Region  Severe depletion  Buccal Region  Severe depletion  Temple Region  Severe depletion  Clavicle Bone Region  Severe depletion  Clavicle and Acromion Bone Region  Severe depletion  Scapular Bone Region  Severe depletion  Dorsal Hand  Moderate depletion  Patellar Region  Severe depletion  Anterior Thigh Region  Severe depletion  Posterior Calf Region  Severe depletion  Edema (RD Assessment)  None  Hair  Reviewed  Eyes  Reviewed  Mouth  Reviewed  Skin  Reviewed  Nails  Reviewed       Diet Order:   Diet Order            DIET - DYS 1 Room service appropriate? Yes with Assist; Fluid consistency: Thin  Diet effective now              EDUCATION NEEDS:   Not appropriate for education at this time  Skin:  Skin Assessment: Skin Integrity Issues: Skin Integrity Issues:: Stage II, Incisions Stage II: sacrum with early/partial granulation Incisions: closed lt hip  Last BM:  10/15/18  Height:   Ht Readings from Last 1 Encounters:  10/10/18 5\' 11"  (1.803 m)    Weight:   Wt Readings from Last 1 Encounters:  10/10/18 63.6 kg    Ideal Body Weight:  78.18 kg  BMI:  Body mass index is 19.56 kg/m.  Estimated Nutritional Needs:  Kcal:  1700-1900  Protein:  80-95 grams  Fluid:  > 1.7 L    Meagen Limones A. Jimmye Norman, RD, LDN, [place] Registered Dietitian II Certified Diabetes Care and Education Specialist Pager: [phone] After hours Pager: [phone]

## 2018-10-16 NOTE — Plan of Care (Signed)
  Problem: Activity: Goal: Ability to ambulate and perform ADLs will improve Outcome: Progressing   Problem: Activity: Goal: Risk for activity intolerance will decrease Outcome: Progressing   Problem: Elimination: Goal: Will not experience complications related to bowel motility Outcome: Progressing

## 2018-10-17 ENCOUNTER — Inpatient Hospital Stay (HOSPITAL_COMMUNITY): Payer: Medicare Other

## 2018-10-17 DIAGNOSIS — R0902 Hypoxemia: Secondary | ICD-10-CM

## 2018-10-17 LAB — CBC
HCT: 27.4 % — ABNORMAL LOW (ref 39.0–52.0)
Hemoglobin: 8.6 g/dL — ABNORMAL LOW (ref 13.0–17.0)
MCH: 32 pg (ref 26.0–34.0)
MCHC: 31.4 g/dL (ref 30.0–36.0)
MCV: 101.9 fL — ABNORMAL HIGH (ref 80.0–100.0)
Platelets: 311 10*3/uL (ref 150–400)
RBC: 2.69 MIL/uL — ABNORMAL LOW (ref 4.22–5.81)
RDW: 15.2 % (ref 11.5–15.5)
WBC: 20.5 10*3/uL — ABNORMAL HIGH (ref 4.0–10.5)
nRBC: 0 % (ref 0.0–0.2)

## 2018-10-17 LAB — BASIC METABOLIC PANEL
Anion gap: 7 (ref 5–15)
BUN: 26 mg/dL — ABNORMAL HIGH (ref 8–23)
CO2: 27 mmol/L (ref 22–32)
Calcium: 8.7 mg/dL — ABNORMAL LOW (ref 8.9–10.3)
Chloride: 107 mmol/L (ref 98–111)
Creatinine, Ser: 0.8 mg/dL (ref 0.61–1.24)
GFR calc Af Amer: >60 mL/min (ref >60)
GFR calc non Af Amer: >60 mL/min (ref >60)
Glucose, Bld: 113 mg/dL — ABNORMAL HIGH (ref 70–99)
Potassium: 4.5 mmol/L (ref 3.5–5.1)
Sodium: 141 mmol/L (ref 135–145)

## 2018-10-17 MED ORDER — ACETAMINOPHEN 650 MG RE SUPP: 650.0000 mg | Freq: Four times a day (QID) | RECTAL | Status: DC | PRN

## 2018-10-17 MED ORDER — ACETAMINOPHEN 325 MG PO TABS: 650.0000 mg | ORAL_TABLET | Freq: Four times a day (QID) | ORAL | Status: DC | PRN

## 2018-10-17 NOTE — Progress Notes (Signed)
Patient ID: Maurice Mckee, male   DOB: 10-06-1925, 85 y.o.   MRN: [id number]    Referring Physician(s): Dr Hinton Rao  Supervising Physician: Markus Daft  Patient Status:  [facility] - In-pt  Chief Complaint:  Right PT Chest tube placed in IR 5/25  Subjective: Water sealed 5/26 and 5/27-- PTX enlarged 5/28-- back to suction Now stable CXR Today: IMPRESSION: 1. Support apparatus, as above. 2. No definite residual pneumothorax. 3. Ill-defined opacities in the right mid to lower lung may reflect areas of residual atelectasis and/or consolidation, with slight improving aeration. 4. Mild cardiomegaly. 5. Aortic atherosclerosis  Pt is laying in bed; seems restful-- asleep Awakened to see how he is feeling Difficult to understand -- but in NAD Chest tube intact -20 suction  Discussed with Dr Anselm Pancoast   Allergies: Lactose intolerance (gi)  Medications: Prior to Admission medications   Medication Sig Start Date End Date Taking? Authorizing Provider  acetaminophen (TYLENOL) 325 MG tablet Take 650 mg by mouth every 6 (six) hours as needed (pain, fever >101).   Yes [provider]  aspirin EC 325 MG tablet Take 325 mg by mouth daily.   Yes [provider]  atorvastatin (LIPITOR) 40 MG tablet Take 40 mg by mouth daily.  11/09/16  Yes [provider]  carvedilol (COREG) 12.5 MG tablet Take 12.5 mg by mouth 2 (two) times daily with a meal.  11/09/16  Yes [provider]  cholecalciferol (VITAMIN D) 1000 units tablet Take 1,000 Units by mouth daily.   Yes [provider]  clopidogrel (PLAVIX) 75 MG tablet Take 75 mg by mouth daily.  11/09/16  Yes [provider]  diphenoxylate-atropine (LOMOTIL) 2.5-0.025 MG tablet Take 2 tablets by mouth every 6 (six) hours as needed for diarrhea or loose stools.   Yes [provider]  donepezil (ARICEPT) 10 MG tablet Take 10 mg by mouth at bedtime.   Yes [provider]  ferrous sulfate 325 (65  FE) MG tablet Take 325 mg by mouth daily.   Yes [provider]  fluticasone (FLONASE) 50 MCG/ACT nasal spray Place 2 sprays into both nostrils daily.   Yes [provider]  furosemide (LASIX) 20 MG tablet Take 20 mg by mouth daily.    Yes [provider]  Guaifenesin (MUCINEX MAXIMUM STRENGTH) 1200 MG TB12 Take 1,200 mg by mouth every 12 (twelve) hours.   Yes [provider]  memantine (NAMENDA) 5 MG tablet Take 5 mg by mouth 2 (two) times daily with a meal.   Yes [provider]     Vital Signs: BP (!) 141/87 (BP Location: Left Arm)    Pulse 75    Temp 97.9 F (36.6 C) (Oral)    Resp 17    Ht 5\' 11"  (1.803 m)    Wt 140 lb 3.4 oz (63.6 kg)    SpO2 96%    BMI 19.56 kg/m   Physical Exam Vitals signs reviewed.  Pulmonary:     Breath sounds: Wheezing present.  Skin:    General: Skin is warm.     Comments: Site of chest tube is clean and dry NT No bleeding No air leak noted in Pleurvac Serous OP Minimal OP overnight  CXR shows no definite PTX    Imaging: Dg Chest Portable 2 Views  Result Date: 10/13/2018 CLINICAL DATA:  Shortness of breath. EXAM: CHEST  2 VIEW PORTABLE COMPARISON:  Chest x-rays dated 10/10/2018, 09/04/2018 and 08/13/2018 FINDINGS: RIGHT-sided pneumothorax, moderate in size. RIGHT  basilar opacity, most likely associated atelectasis. No shift of the mediastinal structures to suggest tension pneumothorax. Heart size and mediastinal contours are stable. LEFT lung is clear. LEFT chest wall pacemaker/ICD apparatus appears stable. Chronic appearing compression deformities of several vertebral bodies at the level of the midthoracic spine. No acute appearing osseous abnormality. IMPRESSION: 1. RIGHT-sided pneumothorax, moderate in size. 2. Probable RIGHT basilar atelectasis. These results were called by telephone at the time of interpretation on 10/13/2018 at 12:47 pm to Dr. Jeannine Kitten, who verbally acknowledged these results. Electronically  Signed   By: Franki Cabot M.D.   On: 10/13/2018 12:51   Dg Chest Port 1 View  Result Date: 10/17/2018 CLINICAL DATA:  85 year old male with history of pneumothorax. Chest tube evaluation. EXAM: PORTABLE CHEST 1 VIEW COMPARISON:  Chest x-ray 10/16/2018. FINDINGS: Small bore right-sided chest tube has shifted in position, now with the pigtail in place projecting over the mid right hemithorax. No appreciable right-sided pneumothorax is identified on today's examination. There continues to be some subcutaneous emphysema in the right lateral chest wall. Lung volumes are low. Irregular opacities in the right mid to lower lung may reflect areas of atelectasis and/or consolidation. Overall, aeration appears slightly improved. Left lung is clear. No pleural effusions. No evidence of pulmonary edema. Heart size is mildly enlarged. Upper mediastinal contours are within normal limits. Aortic atherosclerosis. Left-sided pacemaker/AICD with lead tips projecting over the expected location of the right atrium and right ventricular apex. IMPRESSION: 1. Support apparatus, as above. 2. No definite residual pneumothorax. 3. Ill-defined opacities in the right mid to lower lung may reflect areas of residual atelectasis and/or consolidation, with slight improving aeration. 4. Mild cardiomegaly. 5. Aortic atherosclerosis. Electronically Signed   By: Vinnie Langton M.D.   On: 10/17/2018 07:50   Dg Chest Port 1 View  Result Date: 10/16/2018 CLINICAL DATA:  Chest tube placement.  Pleural effusion. EXAM: PORTABLE CHEST 1 VIEW COMPARISON:  July 17, 2018 FINDINGS: Chest tube remains on the right, currently with the pigtail more laterally and inferiorly position than 1 day prior. There is a minimal right apical pneumothorax. There is a small amount of residual pleural effusion on the right, less than on 1 day prior. There is atelectatic change in the right base. The left lung is clear. Heart is upper normal in size with pulmonary  vascularity normal. Pacemaker leads are attached to the right atrium and right ventricle. There is aortic atherosclerosis. No adenopathy. Bones are osteoporotic. IMPRESSION: Chest tube remains on the right, although the pigtail is more inferiorly and laterally positioned than on 1 day prior. There is a minimal right apical pneumothorax. There is a small residual right effusion with right base atelectasis. Left lung clear. Stable cardiac silhouette. Aortic Atherosclerosis (ICD10-I70.0). Electronically Signed   By: Lowella Grip III M.D.   On: 10/16/2018 07:53   Dg Chest Port 1 View  Result Date: 10/15/2018 CLINICAL DATA:  85 year old male with history of pneumothorax. Chest tube placed. EXAM: PORTABLE CHEST 1 VIEW COMPARISON:  Chest x-ray 10/14/2018. FINDINGS: Right-sided chest tube in position with pigtail reformed in the medial aspect of the right hemithorax. No appreciable pneumothorax. Small to moderate right pleural effusion, small right pleural effusion, similar to the prior examination. Irregular opacities in the right mid to lower lung may reflect associated atelectasis and/or airspace consolidation. Left lung is clear. No left pleural effusion. No evidence of pulmonary edema. Heart size is upper limits of normal. Upper mediastinal contours are within normal limits. Aortic atherosclerosis. Left-sided  pacemaker/AICD with leads projecting over the expected location of the right atrium and right ventricle. IMPRESSION: 1. Support apparatus, as above. 2. No residual pneumothorax identified. Right-sided chest tube appears stable in position, but there continues to be a small right pleural effusion with some associated atelectasis and/or consolidation throughout the right mid to lower lung. 3. Aortic atherosclerosis. Electronically Signed   By: Vinnie Langton M.D.   On: 10/15/2018 08:27   Dg Chest Port 1 View  Result Date: 10/14/2018 CLINICAL DATA:  Pneumothorax, chest tube EXAM: PORTABLE CHEST 1 VIEW  COMPARISON:  10/13/2018 FINDINGS: Right chest tube has been placed with re-expansion of the right lung. No visible pneumothorax currently. Right lower lobe consolidation. Left lung clear. Heart is mildly enlarged. Left AICD is unchanged. IMPRESSION: Interval placement of right chest tube with re-expansion of the right lung. No visible pneumothorax. Right lower lobe consolidation. Cardiomegaly. Electronically Signed   By: Rolm Baptise M.D.   On: 10/14/2018 08:13   Ct Perc Pleural Drain W/indwell Cath W/img Guide  Result Date: 10/13/2018 CLINICAL DATA:  Hypoxia. Right pneumothorax noted. Recent anesthesia for fixation of right femur neck fracture. EXAM: CT GUIDED CHEST TUBE PLACEMENT ANESTHESIA/SEDATION: Intravenous Fentanyl 98mcg and Versed 0.5mg  were administered as conscious sedation during continuous monitoring of the patient's level of consciousness and physiological / cardiorespiratory status by the radiology RN, with a total moderate sedation time of 13 minutes. PROCEDURE: The procedure, risks, benefits, and alternatives were explained to the son. Questions regarding the procedure were encouraged and answered. The son understands and consents to the procedure. Select scans through the thorax were obtained. The right pneumothorax was localized and an appropriate skin entry site was determined and marked. The operative field was prepped with chlorhexidinein a sterile fashion, and a sterile drape was applied covering the operative field. A sterile gown and sterile gloves were used for the procedure. Local anesthesia was provided with 1% Lidocaine. Under CT guidance, a 19 gauge percutaneous entry needle was advanced into the right pleural space. Gas could be aspirated. An Amplatz wire advanced centrally easily. Tract was dilated to facilitate placement of a 14 French pigtail catheter. CT confirms good position with evacuation of the pneumothorax. The catheter was secured externally with 0 Prolene suture and  StatLock, and placed to Pleur-evac -20 cm H2O suction. The patient tolerated the procedure well. COMPLICATIONS: None immediate FINDINGS: Simple appearing moderate right pneumothorax was localized. 14 French pigtail chest tube placed as above. IMPRESSION: 1. Technically successful CT-guided right chest tube placement Electronically Signed   By: Lucrezia Europe M.D.   On: 10/13/2018 14:57    Labs:  CBC: Recent Labs    10/14/18 0850 10/15/18 0209 10/16/18 0143 10/17/18 0150  WBC 18.4* 16.8* 16.7* 20.5*  HGB 7.8* 8.0* 8.1* 8.6*  HCT 24.7* 25.8* 26.1* 27.4*  PLT 187 240 296 311    COAGS: Recent Labs    10/10/18 1300 10/11/18 0327  INR 1.2 1.2  APTT 36  --     BMP: Recent Labs    10/14/18 1413 10/15/18 0209 10/16/18 0143 10/17/18 0150  NA 140 141 142 141  K 4.0 4.4 4.4 4.5  CL 105 105 108 107  CO2 28 28 29 27   GLUCOSE 136* 109* 120* 113*  BUN 33* 31* 27* 26*  CALCIUM 8.5* 8.4* 8.8* 8.7*  CREATININE 0.81 0.81 0.81 0.80  GFRNONAA >60 >60 >60 >60  GFRAA >60 >60 >60 >60    LIVER FUNCTION TESTS: Recent Labs    10/10/18 1300  10/11/18 0327  BILITOT 0.7 0.8  AST 18 15  ALT 19 18  ALKPHOS 126 116  PROT 5.4* 4.7*  ALBUMIN 2.6* 2.2*    Assessment and Plan:  Rt Chest tube placed 5/25 in IR In place No air leak Possible small apical PTX per Dr Anselm Pancoast this am Will leave to suction for now Recheck am CXR Consider water seal after result 5/30  Electronically Signed: Lavonia Drafts, PA-C 10/17/2018, 11:05 AM   I spent a total of 25 Minutes at the the patient's bedside AND on the patient's hospital floor or unit, greater than 50% of which was counseling/coordinating care for right chest tube

## 2018-10-17 NOTE — Progress Notes (Signed)
Pt alert to self. Noted to ramble at times, but pleasant. No acute distress noted. Chest tube in place, attached to wall suction. Pt tolerated medication well. Continue to monitor.

## 2018-10-17 NOTE — Progress Notes (Signed)
Family Medicine Teaching Service Daily Progress Note Intern Pager: [phone]  Patient name: Maurice Mckee Medical record number: [id number] Date of birth: Jan 10, 1926 Age: 85 y.o. Gender: male  Primary Care Provider: Patient, No Pcp Per Consultants: Ortho, IR Code Status: Full   Pt Overview and Major Events to Date:  5/22 admitted for R hip fracture 5/24 - IM fixiation of right femur 5/25 - Right Pneumothorax s/p right chest tube placement  Assessment and Plan: Maurice Mckee is a 85 y.o. male presenting from SNF after right hip fracture. PMH is significant for left hip fracture s/p repair on 09/04/18 (SNF at [facility]), recent h/o PNA in April, severe dementia, CHF (EF 45-50% on 04/13/18) , HTN,  HLD, and vitamin D deficiency  Leukocytosis: WBC inc from 16>20. More sleepy on exam, but has remained hemodynamically stable and aferbiel overnight. Denies any concerns, however not sure how well of a historian patient is. He appears to wax and wane throughout the day.  - Continue to monitor for changes in clinical status - If develops fever, obtain CBC, CMP, UA, blood/urine culture, repeat CXR and start antibiotics  Right Pneumothorax s/p right thoracostomy: S/p placement CT guided right thoracostomy on 5/25.  No signs of infection, air leaks, or bleeds.  Breathing comfortably on room air.  Chest tube placed back on suction overnight due to small recurrence of apical pneumothorax. Repeat CXR with no residual pneumothorax, however per IR some concern for small apical PTX.  - Per IR, leave on suction, follow up AM CXR, consider water seal on 5/30  Right femoral fracture s/p IM  H/o Multiple Falls: stable Denies any pain. Continues to work well with PT/OT.  Denies any pain this morning.  No signs of infection, bleeding, or hematoma on inspection.  Pulses intact, incision well appearing. - Per Orthopedics WBAT, wound care, and follow up in 2 wks for staple removal, ice to hip  - Resume  ASA/plavix on discharge   - Tylenol and tramadol PRN for pain  - Continue lovenox and SCDs for DVT prophylaxis - PT/OT- recommend SNF, 3n1 bedside commode, wheelchair with cushion  Anemia: stable Hgb stable at 8. BL 12-13.  No signs of bleeding. - daily CBC - cont home iron  HFrEF (45-50%)  Pacemaker: Continues to appear euvolemic on exam. 436mL UOP overnight. Bladder scan with 177mL. - continue home Coreg 12.5mg  BID - restart asa, plavix on discharge - Continue home lasix - daily BMP - monitor fluid status  Advanced Dementia: Home meds: Aricept and Namenda - hold home meds, may stop during this hospital stay given patient likely not benefiting from these medications  Severe Protein calorie malnutrition: Albumin 2.6. Cachetic appearing on exam.  - Nutrition consulted, appreciate recs - MVTI, Ensure BID, Hormel shake TID with meals, Magic Cup TID w/ meals - feeding assistance  FEN/GI: ADAT Prophylaxis: SCDs  Disposition: SNF   Subjective:  Patient appears more drowsy this morning, answers all questions appropriately.  Denies any pain, chest pain, shortness of breath, abdominal pain.  No acute events overnight.  Objective: Temp:  [97.4 F (36.3 C)-97.9 F (36.6 C)] 97.9 F (36.6 C) (05/29 0600) Pulse Rate:  [70-79] 75 (05/29 0600) Resp:  [17-18] 17 (05/29 0600) BP: (106-141)/(53-87) 141/87 (05/29 0600) SpO2:  [78 %-98 %] 96 % (05/29 0600) Physical Exam: General: elderly male, thin appearing, NAD, resting comfortably HEENT: normocephalic, atraumatic CV: regular rate and rhythm without murmurs, rubs, or gallops, no lower extremity edema, 2+ radial and pedal pulses bilaterally, SCDs  in place Lungs: clear to auscultation bilaterally with normal work of breathing on room air Abdomen: soft, non-tender, non-distended, normoactive bowel sounds Skin: warm, dry, incision of right hip well appearing, staples intact, no drainage/bleeding/erythema, nontender to palpation. Left  hip incision well healing, no signs of infection Extremities: warm and well perfused Neuro: sleepy but arousable, speech normal, answers questions but mumbles with difficulty in understanding at times  Laboratory: Recent Labs  Lab 10/15/18 0209 10/16/18 0143 10/17/18 0150  WBC 16.8* 16.7* 20.5*  HGB 8.0* 8.1* 8.6*  HCT 25.8* 26.1* 27.4*  PLT 240 296 311   Recent Labs  Lab 10/10/18 1300 10/11/18 0327  10/15/18 0209 10/16/18 0143 10/17/18 0150  NA 141 138   < > 141 142 141  K 4.2 4.1   < > 4.4 4.4 4.5  CL 107 107   < > 105 108 107  CO2 25 25   < > 28 29 27   BUN 17 17   < > 31* 27* 26*  CREATININE 0.94 0.80   < > 0.81 0.81 0.80  CALCIUM 8.6* 8.2*   < > 8.4* 8.8* 8.7*  PROT 5.4* 4.7*  --   --   --   --   BILITOT 0.7 0.8  --   --   --   --   ALKPHOS 126 116  --   --   --   --   ALT 19 18  --   --   --   --   AST 18 15  --   --   --   --   GLUCOSE 136* 119*   < > 109* 120* 113*   < > = values in this interval not displayed.    TSH 3.805  EKG NSR  Iron/TIBC/Ferritin/ %Sat    Component Value Date/Time   IRON 16 (L) 10/11/2018 0327   TIBC 176 (L) 10/11/2018 0327   FERRITIN 393 (H) 10/11/2018 0327   IRONPCTSAT 9 (L) 10/11/2018 0327     Imaging/Diagnostic Tests: Dg Chest Port 1 View  Result Date: 10/17/2018 CLINICAL DATA:  85 year old male with history of pneumothorax. Chest tube evaluation. EXAM: PORTABLE CHEST 1 VIEW COMPARISON:  Chest x-ray 10/16/2018. FINDINGS: Small bore right-sided chest tube has shifted in position, now with the pigtail in place projecting over the mid right hemithorax. No appreciable right-sided pneumothorax is identified on today's examination. There continues to be some subcutaneous emphysema in the right lateral chest wall. Lung volumes are low. Irregular opacities in the right mid to lower lung may reflect areas of atelectasis and/or consolidation. Overall, aeration appears slightly improved. Left lung is clear. No pleural effusions. No  evidence of pulmonary edema. Heart size is mildly enlarged. Upper mediastinal contours are within normal limits. Aortic atherosclerosis. Left-sided pacemaker/AICD with lead tips projecting over the expected location of the right atrium and right ventricular apex. IMPRESSION: 1. Support apparatus, as above. 2. No definite residual pneumothorax. 3. Ill-defined opacities in the right mid to lower lung may reflect areas of residual atelectasis and/or consolidation, with slight improving aeration. 4. Mild cardiomegaly. 5. Aortic atherosclerosis. Electronically Signed   By: Vinnie Langton M.D.   On: 10/17/2018 07:50   Dg Chest Port 1 View  Result Date: 10/16/2018 CLINICAL DATA:  Chest tube placement.  Pleural effusion. EXAM: PORTABLE CHEST 1 VIEW COMPARISON:  July 17, 2018 FINDINGS: Chest tube remains on the right, currently with the pigtail more laterally and inferiorly position than 1 day prior. There is a minimal  right apical pneumothorax. There is a small amount of residual pleural effusion on the right, less than on 1 day prior. There is atelectatic change in the right base. The left lung is clear. Heart is upper normal in size with pulmonary vascularity normal. Pacemaker leads are attached to the right atrium and right ventricle. There is aortic atherosclerosis. No adenopathy. Bones are osteoporotic. IMPRESSION: Chest tube remains on the right, although the pigtail is more inferiorly and laterally positioned than on 1 day prior. There is a minimal right apical pneumothorax. There is a small residual right effusion with right base atelectasis. Left lung clear. Stable cardiac silhouette. Aortic Atherosclerosis (ICD10-I70.0). Electronically Signed   By: Lowella Grip III M.D.   On: 10/16/2018 07:53   Danna Hefty, DO 10/17/2018, 12:05 PM PGY-1, [place] Intern pager: [phone], text pages welcome

## 2018-10-17 NOTE — Care Management Important Message (Signed)
Important Message  Patient Details  Name: Maurice Mckee MRN: [id number] Date of Birth: 04-02-1926   Medicare Important Message Given:  Yes    [name] 10/17/2018, 11:07 AM

## 2018-10-17 NOTE — Progress Notes (Signed)
Physical Therapy Treatment Patient Details Name: Maurice Mckee MRN: [id number] DOB: [birth year] Today's Date: 10/17/2018    History of Present Illness 85 yo male admitted from [facility] s/p fall for R IM nail PMH: L hemiarthroplasty 09/04/18 PNA Dementia    PT Comments    Pt performed gait training progression with close chair follow and +2 assistance.  Pt is confused due to dementia but pleasant.  He follows commands inconsistently.  Continue to recommend SNF placement based on level of assistance at this time.   Follow Up Recommendations  SNF;Supervision/Assistance - 24 hour     Equipment Recommendations  None recommended by PT    Recommendations for Other Services       Precautions / Restrictions Precautions Precautions: Fall Restrictions Weight Bearing Restrictions: Yes RLE Weight Bearing: Weight bearing as tolerated    Mobility  Bed Mobility Overal bed mobility: Needs Assistance Bed Mobility: Supine to Sit     Supine to sit: +2 for physical assistance;Max assist     General bed mobility comments: Assist for LEs and to elevate trunk OOB.  Transfers Overall transfer level: Needs assistance Equipment used: Rolling walker (2 wheeled) Transfers: Sit to/from [facility] Sit to Stand: Mod assist;+2 physical assistance Stand pivot transfers: +2 physical assistance;Mod assist       General transfer comment: Assist for power up with sit<>stand from bed and from recliner.  Assist for steadying and to control RW with pivot to chair. Pt then ambulating from chair with +2 mod-max assist.  Ambulation/Gait Ambulation/Gait assistance: Max assist;+2 physical assistance Gait Distance (Feet): 6 Feet(+ 15 ft) Assistive device: Rolling walker (2 wheeled) Gait Pattern/deviations: Step-to pattern;Shuffle;Antalgic;Narrow base of support     General Gait Details: Pt able to progress to x2 short trials of gait training.  He presents with flexed posture and  improper sequencing.  During second trial able to ambulate with correct sequencing with PTA assisting in advancing RLE forward first.  Close chair follow utilized.     Stairs             Wheelchair Mobility    Modified Rankin (Stroke Patients Only)       Balance Overall balance assessment: Needs assistance Sitting-balance support: Single extremity supported;Feet supported Sitting balance-Leahy Scale: Fair Sitting balance - Comments: flexed trunk and head    Standing balance support: Bilateral upper extremity supported;During functional activity Standing balance-Leahy Scale: Poor Standing balance comment: reliant on UE support with RW with flexed posture.                            Cognition Arousal/Alertness: Awake/alert Behavior During Therapy: WFL for tasks assessed/performed Overall Cognitive Status: History of cognitive impairments - at baseline                                 General Comments: dementia at baseline      Exercises      General Comments        Pertinent Vitals/Pain Pain Assessment: Faces Faces Pain Scale: Hurts even more Pain Location: R leg Pain Descriptors / Indicators: Discomfort;Grimacing Pain Intervention(s): Monitored during session;Repositioned    Home Living                      Prior Function            PT Goals (current goals can now be  found in the care plan section) Acute Rehab PT Goals Patient Stated Goal: To comb his hair Potential to Achieve Goals: Fair Progress towards PT goals: Progressing toward goals    Frequency    Min 3X/week      PT Plan Current plan remains appropriate    Co-evaluation PT/OT/SLP Co-Evaluation/Treatment: Yes Reason for Co-Treatment: Complexity of the patient's impairments (multi-system involvement);Necessary to address cognition/behavior during functional activity PT goals addressed during session: Mobility/safety with mobility OT goals addressed  during session: ADL's and self-care      AM-PAC PT "6 Clicks" Mobility   Outcome Measure  Help needed turning from your back to your side while in a flat bed without using bedrails?: A Lot Help needed moving from lying on your back to sitting on the side of a flat bed without using bedrails?: A Lot Help needed moving to and from a bed to a chair (including a wheelchair)?: A Lot Help needed standing up from a chair using your arms (e.g., wheelchair or bedside chair)?: A Lot Help needed to walk in hospital room?: Total Help needed climbing 3-5 steps with a railing? : Total 6 Click Score: 10    End of Session Equipment Utilized During Treatment: Gait belt Activity Tolerance: Patient tolerated treatment well Patient left: in chair;with call bell/phone within reach;with chair alarm set Nurse Communication: Mobility status PT Visit Diagnosis: Unsteadiness on feet (R26.81);Other abnormalities of gait and mobility (R26.89);Muscle weakness (generalized) (M62.81)     Time: 6294-7654 PT Time Calculation (min) (ACUTE ONLY): 26 min  Charges:  $Gait Training: 8-22 mins                     Governor Rooks, PTA Acute Rehabilitation Services Pager [phone] Office [place] 10/17/2018, 3:41 PM

## 2018-10-17 NOTE — Plan of Care (Signed)
  Problem: Education: Goal: Verbalization of understanding the information provided (i.e., activity precautions, restrictions, etc) will improve Outcome: Progressing Goal: Individualized Educational Video(s) Outcome: Progressing   Problem: Activity: Goal: Ability to ambulate and perform ADLs will improve Outcome: Progressing   Problem: Clinical Measurements: Goal: Postoperative complications will be avoided or minimized Outcome: Progressing   Problem: Self-Concept: Goal: Ability to maintain and perform role responsibilities to the fullest extent possible will improve Outcome: Progressing   Problem: Pain Management: Goal: Pain level will decrease Outcome: Progressing   Problem: Education: Goal: Knowledge of General Education information will improve Description Including pain rating scale, medication(s)/side effects and non-pharmacologic comfort measures Outcome: Progressing   Problem: Health Behavior/Discharge Planning: Goal: Ability to manage health-related needs will improve Outcome: Progressing   Problem: Clinical Measurements: Goal: Will remain free from infection Outcome: Progressing Goal: Cardiovascular complication will be avoided Outcome: Progressing   Problem: Activity: Goal: Risk for activity intolerance will decrease Outcome: Progressing   Problem: Nutrition: Goal: Adequate nutrition will be maintained Outcome: Progressing   Problem: Coping: Goal: Level of anxiety will decrease Outcome: Progressing   Problem: Elimination: Goal: Will not experience complications related to bowel motility Outcome: Progressing Goal: Will not experience complications related to urinary retention Outcome: Progressing   Problem: Safety: Goal: Ability to remain free from injury will improve Outcome: Progressing   Problem: Skin Integrity: Goal: Risk for impaired skin integrity will decrease Outcome: Progressing

## 2018-10-17 NOTE — Progress Notes (Signed)
CSW called and spoke with the patient's son. He still was not sure about bringing his father home versus going to a SNF. CSW went over the most recent therapy note with the patient's son. He stated that he would like to discuss it with his four other siblings. He stated that they would have a decision no later than Saturday.   CSW informed him that the weekend social worker would reach out to see what the family decided.   CSW will continue to follow and assist with disposition.   Domenic Schwab, MSW, [place]

## 2018-10-17 NOTE — Progress Notes (Signed)
Occupational Therapy Treatment Patient Details Name: Allon Costlow MRN: [id number] DOB: Nov 22, 1925 Today's Date: 10/17/2018    History of present illness 85 yo male admitted from [facility] s/p fall for R IM nail PMH: L hemiarthroplasty 09/04/18 PNA Dementia   OT comments  Pt participated in grooming tasks sitting EOB. Requires cueing to initiate grooming and for completion of tasks.  +2 mod assist for stand pivot transfer from bed to recliner with RW.  +2 mod-max assist to ambulate with RW, fatiguing as mobility progresses. Continue to recommend SNF for discharge planning. Will continue to follow acutely.  Follow Up Recommendations  SNF    Equipment Recommendations  3 in 1 bedside commode;Wheelchair (measurements OT);Wheelchair cushion (measurements OT)    Recommendations for Other Services      Precautions / Restrictions Precautions Precautions: Fall Restrictions Weight Bearing Restrictions: Yes RLE Weight Bearing: Weight bearing as tolerated       Mobility Bed Mobility Overal bed mobility: Needs Assistance Bed Mobility: Supine to Sit     Supine to sit: +2 for physical assistance;Max assist     General bed mobility comments: Assist for LEs and to elevate trunk OOB.  Transfers Overall transfer level: Needs assistance Equipment used: Rolling walker (2 wheeled) Transfers: Sit to/from [facility] Sit to Stand: Mod assist;+2 physical assistance Stand pivot transfers: +2 physical assistance;Mod assist       General transfer comment: Assist for power up with sit<>stand from bed and from recliner.  Assist for steadying and to control RW with pivot to chair. Pt then ambulating from chair with +2 mod-max assist.    Balance Overall balance assessment: Needs assistance Sitting-balance support: Single extremity supported;Feet supported Sitting balance-Leahy Scale: Fair     Standing balance support: Bilateral upper extremity supported;During  functional activity Standing balance-Leahy Scale: Poor Standing balance comment: reliant on UE support with RW                           ADL either performed or assessed with clinical judgement   ADL Overall ADL's : Needs assistance/impaired     Grooming: Brushing hair;Wash/dry face;Supervision/safety;Sitting(cues for initiation and completion of tasks)                   Toilet Transfer: +2 for physical assistance;Ambulation;RW Toilet Transfer Details (indicate cue type and reason): simulated         Functional mobility during ADLs: +2 for physical assistance;Moderate assistance;Rolling walker;Maximal assistance General ADL Comments: Pt sat EOB to participate in grooming. Ambulating with RW with +2 mod-max assist (increased assist as he fatigues).     Vision       Perception     Praxis      Cognition Arousal/Alertness: Awake/alert Behavior During Therapy: WFL for tasks assessed/performed Overall Cognitive Status: History of cognitive impairments - at baseline                                 General Comments: dementia at baseline        Exercises     Shoulder Instructions       General Comments      Pertinent Vitals/ Pain       Pain Assessment: Faces Faces Pain Scale: Hurts even more Pain Location: R leg Pain Descriptors / Indicators: Discomfort;Grimacing Pain Intervention(s): Monitored during session  Home Living  Prior Functioning/Environment              Frequency  Min 2X/week        Progress Toward Goals  OT Goals(current goals can now be found in the care plan section)  Progress towards OT goals: Progressing toward goals  Acute Rehab OT Goals Patient Stated Goal: To comb his hair OT Goal Formulation: Patient unable to participate in goal setting Time For Goal Achievement: 10/27/18 Potential to Achieve Goals: Good ADL Goals Pt Will Perform  Grooming: with modified independence;sitting Pt Will Transfer to Toilet: with mod assist;bedside commode;stand pivot transfer Additional ADL Goal #1: pt will complete bed mobility min (A) as precusor to adls.  Plan Discharge plan remains appropriate;Frequency remains appropriate    Co-evaluation    PT/OT/SLP Co-Evaluation/Treatment: Yes Reason for Co-Treatment: To address functional/ADL transfers;For patient/therapist safety   OT goals addressed during session: ADL's and self-care(functional mobility)      AM-PAC OT "6 Clicks" Daily Activity     Outcome Measure   Help from another person eating meals?: A Little Help from another person taking care of personal grooming?: A Little Help from another person toileting, which includes using toliet, bedpan, or urinal?: A Lot Help from another person bathing (including washing, rinsing, drying)?: A Lot Help from another person to put on and taking off regular upper body clothing?: A Little Help from another person to put on and taking off regular lower body clothing?: Total 6 Click Score: 14    End of Session Equipment Utilized During Treatment: Gait belt;Rolling walker  OT Visit Diagnosis: Unsteadiness on feet (R26.81)   Activity Tolerance Patient tolerated treatment well   Patient Left in chair;with call bell/phone within reach;with chair alarm set   Nurse Communication Mobility status        Time: 1660-6301 OT Time Calculation (min): 25 min  Charges: OT General Charges $OT Visit: 1 Visit OT Treatments $Self Care/Home Management : 8-22 mins     Darrol Jump OTR/L 10/17/2018, 1:38 PM

## 2018-10-17 NOTE — Progress Notes (Signed)
Bladder scan done by [facility], NT, tolerated well.  Output= 182

## 2018-10-18 ENCOUNTER — Inpatient Hospital Stay (HOSPITAL_COMMUNITY): Payer: Medicare Other

## 2018-10-18 LAB — CBC
HCT: 25.7 % — ABNORMAL LOW (ref 39.0–52.0)
Hemoglobin: 8 g/dL — ABNORMAL LOW (ref 13.0–17.0)
MCH: 31.6 pg (ref 26.0–34.0)
MCHC: 31.1 g/dL (ref 30.0–36.0)
MCV: 101.6 fL — ABNORMAL HIGH (ref 80.0–100.0)
Platelets: 355 10*3/uL (ref 150–400)
RBC: 2.53 MIL/uL — ABNORMAL LOW (ref 4.22–5.81)
RDW: 15.6 % — ABNORMAL HIGH (ref 11.5–15.5)
WBC: 16.9 10*3/uL — ABNORMAL HIGH (ref 4.0–10.5)
nRBC: 0 % (ref 0.0–0.2)

## 2018-10-18 LAB — BASIC METABOLIC PANEL
Anion gap: 9 (ref 5–15)
BUN: 30 mg/dL — ABNORMAL HIGH (ref 8–23)
CO2: 26 mmol/L (ref 22–32)
Calcium: 8.3 mg/dL — ABNORMAL LOW (ref 8.9–10.3)
Chloride: 104 mmol/L (ref 98–111)
Creatinine, Ser: 0.86 mg/dL (ref 0.61–1.24)
GFR calc Af Amer: >60 mL/min (ref >60)
GFR calc non Af Amer: >60 mL/min (ref >60)
Glucose, Bld: 112 mg/dL — ABNORMAL HIGH (ref 70–99)
Potassium: 4.3 mmol/L (ref 3.5–5.1)
Sodium: 139 mmol/L (ref 135–145)

## 2018-10-18 NOTE — Progress Notes (Signed)
Family Medicine Teaching Service Daily Progress Note Intern Pager: [phone]  Patient name: Maurice Mckee Medical record number: [id number] Date of birth: Jul 05, 1925 Age: 85 y.o. Gender: male  Primary Care Provider: Patient, No Pcp Per Consultants: Ortho, IR Code Status: Full   Pt Overview and Major Events to Date:  5/22 admitted for R hip fracture 5/24 - IM fixiation of right femur 5/25 - Right Pneumothorax s/p right chest tube placement  Assessment and Plan: Breylin Dom is a 85 y.o. male presenting from SNF after right hip fracture. PMH is significant for left hip fracture s/p repair on 09/04/18 (SNF at [facility]), recent h/o PNA in April, severe dementia, CHF (EF 45-50% on 04/13/18) , HTN,  HLD, and vitamin D deficiency  Leukocytosis: improving WBC dwontrending 16>20>16. Continues to remain afebrile off of tylenol and hemodynamically stable.  - Continue to monitor - If develops fever, obtain CBC, CMP, UA, blood/urine culture, repeat CXR and start antibiotics  Right Pneumothorax s/p right thoracostomy: stable S/p placement CT guided right thoracostomy on 5/25.  No signs of infection, air leaks, or bleeding. Bandage clean and intact. Chest tube remained on suction overnight per IR recs. CXR this AM with tiny right apical pneumothorax. This has been stable since 5/28. Placed on 2L O2 overnight for a desaturation. Breathing comfortably this AM. Can likely place on waterseal tonight. - Follow up IR recs for chest tube management - wean O2 as tolerated  Right femoral fracture s/p IM  H/o Multiple Falls: stable Continues to work with PT/OT. Plan is to discharge back to SNF. No signs of infection, bleeding, or hematoma on inspection. Staples intact, incision well appearing. - Per Orthopedics WBAT, wound care, and follow up in 2 wks for staple removal, ice to hip  - Resume ASA/plavix on discharge   - Tylenol and tramadol PRN for pain  - Continue lovenox and SCDs for DVT  prophylaxis - PT/OT- recommend SNF, 3n1 bedside commode, wheelchair with cushion  Anemia: stable Hgb stable at 8. BL 12-13.  No signs of bleeding. - daily CBC - cont home iron  HFrEF (45-50%)  Pacemaker: Euvolemic on exam. 650 mL UOP overnight. - continue home Coreg 12.5mg  BID - restart asa, plavix on discharge - Continue home lasix - daily BMP - monitor fluid status  Advanced Dementia: Home meds: Aricept and Namenda Mental status waxes and wanes daily. More sleepy but arousable this AM.  - hold home meds, may stop during this hospital stay given patient likely not benefiting from these medications  Severe Protein calorie malnutrition: Albumin 2.6. Cachetic appearing on exam.  - Nutrition consulted, appreciate recs - MVTI, Ensure BID, Hormel shake TID with meals, Magic Cup TID w/ meals - feeding assistance  FEN/GI: ADAT Prophylaxis: SCDs  Disposition: SNF   Subjective:  Patient sleepy but arousable during exam this AM. Answers questions when asked. No acute events overnight.  Objective: Temp:  [98.1 F (36.7 C)-99.1 F (37.3 C)] 98.7 F (37.1 C) (05/30 0515) Pulse Rate:  [70-94] 94 (05/30 0515) Resp:  [15-18] 18 (05/30 0515) BP: (97-125)/(46-66) 116/66 (05/30 0515) SpO2:  [74 %-99 %] 96 % (05/30 0515) Physical Exam: General: thin elderly male, NAD, resting comfortably CV: regular rate and rhythm without murmurs, rubs, or gallops, no lower extremity edema, 2+ radial and pedal pulses bilaterally Lungs: exam limited but clear to anterior auscultation, no crackles appreciated, normal WOB on 2L O2 Abdomen: soft, non-tender, non-distended,normoactive bowel sounds Skin: warm, dry, incision of right hip well appearing, staples intact, no  drainage, bleeding or erythema, nontender to palpation. Left hip incision healing well, no signs of infection Extremities: warm and well perfused, SCD's in place Neuro: Sleepy but arousable, speech difficult to understand at baseline    Laboratory: Recent Labs  Lab 10/16/18 0143 10/17/18 0150 10/18/18 0452  WBC 16.7* 20.5* 16.9*  HGB 8.1* 8.6* 8.0*  HCT 26.1* 27.4* 25.7*  PLT 296 311 355   Recent Labs  Lab 10/16/18 0143 10/17/18 0150 10/18/18 0452  NA 142 141 139  K 4.4 4.5 4.3  CL 108 107 104  CO2 29 27 26   BUN 27* 26* 30*  CREATININE 0.81 0.80 0.86  CALCIUM 8.8* 8.7* 8.3*  GLUCOSE 120* 113* 112*    TSH 3.805  EKG NSR  Iron/TIBC/Ferritin/ %Sat    Component Value Date/Time   IRON 16 (L) 10/11/2018 0327   TIBC 176 (L) 10/11/2018 0327   FERRITIN 393 (H) 10/11/2018 0327   IRONPCTSAT 9 (L) 10/11/2018 0327     Imaging/Diagnostic Tests: Dg Chest Port 1 View  Result Date: 10/18/2018 CLINICAL DATA:  Pneumothorax EXAM: PORTABLE CHEST 1 VIEW COMPARISON:  Chest radiograph from one day prior. FINDINGS: Stable configuration of 2 lead left subclavian ICD. Stable right basilar pigtail chest tube. Stable cardiomediastinal silhouette with normal heart size. Tiny right apical pneumothorax, less than 5%, not definitely seen on prior. No left pneumothorax. No pleural effusion. Patchy lower right lung opacity is similar. No pulmonary edema. No mediastinal shift. IMPRESSION: 1. Tiny right apical pneumothorax, less than 5%, not definitely seen on prior. Right pigtail chest tube in place. No mediastinal shift. 2. Stable patchy lower right lung opacity. Electronically Signed   By: Ilona Sorrel M.D.   On: 10/18/2018 09:46   Dg Chest Port 1 View  Result Date: 10/17/2018 CLINICAL DATA:  85 year old male with history of pneumothorax. Chest tube evaluation. EXAM: PORTABLE CHEST 1 VIEW COMPARISON:  Chest x-ray 10/16/2018. FINDINGS: Small bore right-sided chest tube has shifted in position, now with the pigtail in place projecting over the mid right hemithorax. No appreciable right-sided pneumothorax is identified on today's examination. There continues to be some subcutaneous emphysema in the right lateral chest wall. Lung  volumes are low. Irregular opacities in the right mid to lower lung may reflect areas of atelectasis and/or consolidation. Overall, aeration appears slightly improved. Left lung is clear. No pleural effusions. No evidence of pulmonary edema. Heart size is mildly enlarged. Upper mediastinal contours are within normal limits. Aortic atherosclerosis. Left-sided pacemaker/AICD with lead tips projecting over the expected location of the right atrium and right ventricular apex. IMPRESSION: 1. Support apparatus, as above. 2. No definite residual pneumothorax. 3. Ill-defined opacities in the right mid to lower lung may reflect areas of residual atelectasis and/or consolidation, with slight improving aeration. 4. Mild cardiomegaly. 5. Aortic atherosclerosis. Electronically Signed   By: Vinnie Langton M.D.   On: 10/17/2018 07:50   Danna Hefty, DO 10/18/2018, 11:36 AM PGY-1, [place] Intern pager: [phone], text pages welcome

## 2018-10-18 NOTE — Progress Notes (Signed)
Family Medicine Progress note  Tiny apical ptx on cxr. No note from IR today. Will place Chest tube to water seal. Will get xray in the am.  Guadalupe Dawn MD PGY-2 Family Medicine Resident

## 2018-10-18 NOTE — Progress Notes (Signed)
Family Medicine Teaching Service Daily Progress Note Intern Pager: [phone]  Patient name: Maurice Mckee Medical record number: [id number] Date of birth: 17-Jul-1925 Age: 85 y.o. Gender: male  Primary Care Provider: Patient, No Pcp Per Consultants: Ortho, IR Code Status: Full   Pt Overview and Major Events to Date:  5/22 admitted for R hip fracture 5/24 - IM fixiation of right femur 5/25 - Right Pneumothorax s/p right chest tube placement  Assessment and Plan: Maurice Mckee is a 85 y.o. male presenting from SNF after right hip fracture. PMH is significant for left hip fracture s/p repair on 09/04/18 (SNF at [facility]), recent h/o PNA in April, severe dementia, CHF (EF 45-50% on 04/13/18) , HTN,  HLD, and vitamin D deficiency  Leukocytosis: improving WBC overall stable 20>16>18. Continues to remain afebrile off of tylenol and hemodynamically stable.  - Continue to monitor - If develops fever, obtain CBC, CMP, UA, blood/urine culture, repeat CXR and start antibiotics  Right Pneumothorax s/p right thoracostomy: stable S/p placement CT guided right thoracostomy on 5/25.  No signs of infection, air leaks, or bleeding. Bandage clean and intact. Chest tube to water seal 5/30. CXR this AM pending, could likely remove if unchanged or better. On RA this am. - Follow up IR recs for chest tube management, likely can remove today - wean O2 as tolerated  Right femoral fracture s/p IM  H/o Multiple Falls: stable Continues to work with PT/OT. Plan is to discharge back to SNF. No signs of infection, bleeding, or hematoma on inspection. Staples intact, incision well appearing. - Per Orthopedics WBAT, wound care, and follow up in 2 wks for staple removal, ice to hip  - Resume ASA/plavix on discharge   - Tylenol and tramadol PRN for pain, none received >48hrs. - Continue lovenox and SCDs for DVT prophylaxis - PT/OT- recommend SNF, 3n1 bedside commode, wheelchair with cushion  Macrocytic Anemia:  stable Hgb stable at 8, BL 12-13. MCV 100s, was also elevated in 2010. No ongoing signs of bleeding. Likely 2/2 inflammation and blood loss during surgery given low iron and high ferritin. Folate low. Consider peripheral smear outpatient. - daily CBC - cont home iron - consider B12, smear, retic count outpatient  HFrEF (45-50%)  Pacemaker: Euvolemic on exam with good UOP last 24 hours.  - continue home Coreg 12.5mg  BID - restart asa, plavix on discharge - Continue home lasix - daily BMP - monitor fluid status  Advanced Dementia: Home meds: Aricept and Namenda Mental status waxes and wanes daily. Mumbled speech, no dentures in place. Awakens to voice. - hold home meds, may stop during this hospital stay given patient likely not benefiting from these medications  Severe Protein calorie malnutrition: Albumin 2.6. Cachetic appearing on exam.  - Nutrition consulted, appreciate recs - MVTI, Ensure BID, Hormel shake TID with meals, Magic Cup TID w/ meals - feeding assistance  FEN/GI: DYS 1 diet Prophylaxis: SCDs  Disposition: SNF   Subjective:  Mumbled speech, states he is "fine" this am.   Objective: Temp:  [98 F (36.7 C)-99.2 F (37.3 C)] 98 F (36.7 C) (05/31 0519) Pulse Rate:  [86-96] 96 (05/31 0519) Resp:  [16-20] 16 (05/31 0519) BP: (100-131)/(57-70) 131/67 (05/31 0519) SpO2:  [94 %-100 %] 100 % (05/31 0519) Physical Exam: General: frail elderly male, lying in bed, in NAD CV: RRR, no murmur Lungs: difficult to appreciate breath sounds d/t shallow respirations, CTAB anteriorly. Chest tube in place. No crackles appreciated. Abdomen: soft, NTND +BS Skin: warm, dry. R  hip bandage clean, dry, intact. Extremities: warm and well perfused, SCD's in place  Laboratory: Recent Labs  Lab 10/17/18 0150 10/18/18 0452 10/19/18 0246  WBC 20.5* 16.9* 18.3*  HGB 8.6* 8.0* 8.3*  HCT 27.4* 25.7* 27.3*  PLT 311 355 389   Recent Labs  Lab 10/17/18 0150 10/18/18 0452  10/19/18 0246  NA 141 139 139  K 4.5 4.3 4.3  CL 107 104 105  CO2 27 26 27   BUN 26* 30* 24*  CREATININE 0.80 0.86 0.83  CALCIUM 8.7* 8.3* 8.7*  GLUCOSE 113* 112* 113*   TSH 3.805 EKG NSR  Iron/TIBC/Ferritin/ %Sat    Component Value Date/Time   IRON 16 (L) 10/11/2018 0327   TIBC 176 (L) 10/11/2018 0327   FERRITIN 393 (H) 10/11/2018 0327   IRONPCTSAT 9 (L) 10/11/2018 0327   Imaging/Diagnostic Tests: Dg Chest Port 1 View  Result Date: 10/18/2018 CLINICAL DATA:  Pneumothorax EXAM: PORTABLE CHEST 1 VIEW COMPARISON:  Chest radiograph from one day prior. FINDINGS: Stable configuration of 2 lead left subclavian ICD. Stable right basilar pigtail chest tube. Stable cardiomediastinal silhouette with normal heart size. Tiny right apical pneumothorax, less than 5%, not definitely seen on prior. No left pneumothorax. No pleural effusion. Patchy lower right lung opacity is similar. No pulmonary edema. No mediastinal shift. IMPRESSION: 1. Tiny right apical pneumothorax, less than 5%, not definitely seen on prior. Right pigtail chest tube in place. No mediastinal shift. 2. Stable patchy lower right lung opacity. Electronically Signed   By: Ilona Sorrel M.D.   On: 10/18/2018 09:46   Rory Percy, DO 10/19/2018, 7:30 AM PGY-2, [place] Intern pager: [phone], text pages welcome

## 2018-10-19 ENCOUNTER — Inpatient Hospital Stay (HOSPITAL_COMMUNITY): Payer: Medicare Other

## 2018-10-19 LAB — BASIC METABOLIC PANEL
Anion gap: 7 (ref 5–15)
BUN: 24 mg/dL — ABNORMAL HIGH (ref 8–23)
CO2: 27 mmol/L (ref 22–32)
Calcium: 8.7 mg/dL — ABNORMAL LOW (ref 8.9–10.3)
Chloride: 105 mmol/L (ref 98–111)
Creatinine, Ser: 0.83 mg/dL (ref 0.61–1.24)
GFR calc Af Amer: >60 mL/min (ref >60)
GFR calc non Af Amer: >60 mL/min (ref >60)
Glucose, Bld: 113 mg/dL — ABNORMAL HIGH (ref 70–99)
Potassium: 4.3 mmol/L (ref 3.5–5.1)
Sodium: 139 mmol/L (ref 135–145)

## 2018-10-19 LAB — CBC WITH DIFFERENTIAL/PLATELET
Abs Immature Granulocytes: 0.35 10*3/uL — ABNORMAL HIGH (ref 0.00–0.07)
Basophils Absolute: 0.1 10*3/uL (ref 0.0–0.1)
Basophils Relative: 0 %
Eosinophils Absolute: 0.3 10*3/uL (ref 0.0–0.5)
Eosinophils Relative: 1 %
HCT: 27.3 % — ABNORMAL LOW (ref 39.0–52.0)
Hemoglobin: 8.3 g/dL — ABNORMAL LOW (ref 13.0–17.0)
Immature Granulocytes: 2 %
Lymphocytes Relative: 24 %
Lymphs Abs: 4.3 10*3/uL — ABNORMAL HIGH (ref 0.7–4.0)
MCH: 31.2 pg (ref 26.0–34.0)
MCHC: 30.4 g/dL (ref 30.0–36.0)
MCV: 102.6 fL — ABNORMAL HIGH (ref 80.0–100.0)
Monocytes Absolute: 1.5 10*3/uL — ABNORMAL HIGH (ref 0.1–1.0)
Monocytes Relative: 8 %
Neutro Abs: 11.8 10*3/uL — ABNORMAL HIGH (ref 1.7–7.7)
Neutrophils Relative %: 65 %
Platelets: 389 10*3/uL (ref 150–400)
RBC: 2.66 MIL/uL — ABNORMAL LOW (ref 4.22–5.81)
RDW: 15.8 % — ABNORMAL HIGH (ref 11.5–15.5)
WBC: 18.3 10*3/uL — ABNORMAL HIGH (ref 4.0–10.5)
nRBC: 0 % (ref 0.0–0.2)

## 2018-10-19 MED ORDER — BOOST PLUS PO LIQD
237.0000 mL | Freq: Two times a day (BID) | ORAL | Status: DC
  Administered 2018-10-19 – 2018-10-21 (×3): 237 mL via ORAL
  Filled 2018-10-19 (×7): qty 237

## 2018-10-19 NOTE — Progress Notes (Signed)
Nurse called son Maurice Mckee to see which Ensure flavor the patient would enjoy.  Maurice Mckee stated that he has asked repeatedly that NO MILK, NO ICE CREAM, and NO ENSURE be given to the patient due to his intolerance of lactose and diarrhea associated with the intolerance.  Nurse made note on hand off paper for oncoming shifts.  Will continue to monitor.

## 2018-10-19 NOTE — Progress Notes (Signed)
Chief Complaint: Pneumothorax  Referring Physician(s): Dr.J Mingo Amber  Supervising Physician: Marybelle Killings  Patient Status: [facility] - In-pt  History of Present Illness: Maurice Mckee is a 85 y.o. male who underwent chest tube placement for pneumothorax on 10/13/2018 by Dr. Vernard Gambles.  He was put to water seal last night.  Only 5% apical PTX this am on water seal.   Past Medical History:  Diagnosis Date   Arthritis    Cancer ([place])    Cardiomyopathy ([place])    CHF (congestive heart failure) ([facility])    COPD (chronic obstructive pulmonary disease) ([place])    Coronary artery disease    Dementia ([place])    Hyperlipidemia    Hypertension     Past Surgical History:  Procedure Laterality Date   CARDIAC PACEMAKER PLACEMENT     INTRAMEDULLARY (IM) NAIL INTERTROCHANTERIC Right 10/12/2018   Procedure: INTRAMEDULLARY (IM) NAIL INTERTROCHANTRIC;  Surgeon: Nicholes Stairs, MD;  Location: [place];  Service: Orthopedics;  Laterality: Right;    Allergies: Lactose intolerance (gi)  Medications: Prior to Admission medications   Medication Sig Start Date End Date Taking? Authorizing Provider  acetaminophen (TYLENOL) 325 MG tablet Take 650 mg by mouth every 6 (six) hours as needed (pain, fever >101).   Yes [provider]  aspirin EC 325 MG tablet Take 325 mg by mouth daily.   Yes [provider]  atorvastatin (LIPITOR) 40 MG tablet Take 40 mg by mouth daily.  11/09/16  Yes [provider]  carvedilol (COREG) 12.5 MG tablet Take 12.5 mg by mouth 2 (two) times daily with a meal.  11/09/16  Yes [provider]  cholecalciferol (VITAMIN D) 1000 units tablet Take 1,000 Units by mouth daily.   Yes [provider]  clopidogrel (PLAVIX) 75 MG tablet Take 75 mg by mouth daily.  11/09/16  Yes [provider]  diphenoxylate-atropine (LOMOTIL) 2.5-0.025 MG tablet Take 2 tablets by mouth every 6 (six) hours as needed for diarrhea or loose stools.    Yes [provider]  donepezil (ARICEPT) 10 MG tablet Take 10 mg by mouth at bedtime.   Yes [provider]  ferrous sulfate 325 (65 FE) MG tablet Take 325 mg by mouth daily.   Yes [provider]  fluticasone (FLONASE) 50 MCG/ACT nasal spray Place 2 sprays into both nostrils daily.   Yes [provider]  furosemide (LASIX) 20 MG tablet Take 20 mg by mouth daily.    Yes [provider]  Guaifenesin (MUCINEX MAXIMUM STRENGTH) 1200 MG TB12 Take 1,200 mg by mouth every 12 (twelve) hours.   Yes [provider]  memantine (NAMENDA) 5 MG tablet Take 5 mg by mouth 2 (two) times daily with a meal.   Yes [provider]     Family History  Problem Relation Age of Onset   Cancer Mother     Social History   Socioeconomic History   Marital status: Widowed    Spouse name: Not on file   Number of children: Not on file   Years of education: Not on file   Highest education level: Not on file  Occupational History   Not on file  Social Needs   Financial resource strain: Not on file   Food insecurity:    Worry: Not on file    Inability: Not on file   Transportation needs:    Medical: Not on file    Non-medical: Not on file  Tobacco Use   Smoking status:  Never Smoker   Smokeless tobacco: Never Used  Substance and Sexual Activity   Alcohol use: Never    Frequency: Never   Drug use: Never   Sexual activity: Not on file  Lifestyle   Physical activity:    Days per week: Not on file    Minutes per session: Not on file   Stress: Not on file  Relationships   Social connections:    Talks on phone: Not on file    Gets together: Not on file    Attends religious service: Not on file    Active member of club or organization: Not on file    Attends meetings of clubs or organizations: Not on file    Relationship status: Not on file  Other Topics Concern   Not on file  Social History Narrative   Not on file     Review of Systems  Vital Signs: BP 131/67 (BP Location: Right Arm)    Pulse 96    Temp 98 F (36.7 C) (Oral)    Resp 16    Ht 5\' 11"  (1.803 m)    Wt 63.6 kg    SpO2 100%    BMI 19.56 kg/m   Physical Exam Sleeping, difficult to arouse Chest tube in place to WS No air leak  Imaging: Dg Chest 1 View  Result Date: 10/19/2018 CLINICAL DATA:  Status post right chest tube removal today. EXAM: CHEST  1 VIEW COMPARISON:  Single-view of the chest earlier today and 10/18/2018. FINDINGS: Pigtail catheter has been removed from the right chest. No pneumothorax. Right infrahilar opacity is unchanged. Left lung clear. Heart size normal. Aortic atherosclerosis noted. No pleural effusion. AICD in place. IMPRESSION: Negative for pneumothorax after chest tube removal. No other change since the exam earlier today. Electronically Signed   By: Inge Rise M.D.   On: 10/19/2018 11:09   Dg Chest 1 View  Result Date: 10/19/2018 CLINICAL DATA:  Follow-up pneumothorax EXAM: CHEST  1 VIEW COMPARISON:  Chest radiograph from one day prior. FINDINGS: Stable configuration of 2 lead left subclavian ICD. Right pigtail chest tube in place. Stable cardiomediastinal silhouette with normal heart size. No appreciable residual pneumothorax. No significant pleural effusion. No pulmonary edema. Patchy right infrahilar opacity appears stable. Clear left lung. IMPRESSION: 1. No appreciable residual pneumothorax.  Right chest tube in place. 2. Stable patchy right infrahilar opacity. Continued chest radiograph follow-up advised. Electronically Signed   By: Ilona Sorrel M.D.   On: 10/19/2018 09:57   Dg Chest 1 View  Result Date: 10/10/2018 CLINICAL DATA:  Right hip fracture. EXAM: CHEST  1 VIEW COMPARISON:  Chest x-ray dated September 04, 2018. FINDINGS: Unchanged left chest wall pacemaker. Stable cardiomediastinal silhouette. Normal pulmonary vascularity. No focal consolidation, pleural effusion, or pneumothorax. No acute osseous  abnormality. IMPRESSION: No active disease. Electronically Signed   By: Titus Dubin M.D.   On: 10/10/2018 16:16   Ct Head Wo Contrast  Result Date: 10/10/2018 CLINICAL DATA:  Altered level of consciousness. EXAM: CT HEAD WITHOUT CONTRAST TECHNIQUE: Contiguous axial images were obtained from the base of the skull through the vertex without intravenous contrast. COMPARISON:  CT scan of August 13, 2018. FINDINGS: Brain: Mild diffuse cortical atrophy is noted. Mild chronic ischemic white matter disease is noted. No mass effect or midline shift is noted. Ventricular size is within normal limits. There is no evidence of mass lesion, hemorrhage or acute infarction. Vascular: No hyperdense vessel or unexpected calcification. Skull: Normal. Negative for fracture  or focal lesion. Sinuses/Orbits: Complete opacification of left maxillary sinus is again noted. Other: None. IMPRESSION: Mild diffuse cortical atrophy. Mild chronic ischemic white matter disease. No acute intracranial abnormality seen. Electronically Signed   By: Marijo Conception M.D.   On: 10/10/2018 13:55   Dg Chest Portable 2 Views  Result Date: 10/13/2018 CLINICAL DATA:  Shortness of breath. EXAM: CHEST  2 VIEW PORTABLE COMPARISON:  Chest x-rays dated 10/10/2018, 09/04/2018 and 08/13/2018 FINDINGS: RIGHT-sided pneumothorax, moderate in size. RIGHT basilar opacity, most likely associated atelectasis. No shift of the mediastinal structures to suggest tension pneumothorax. Heart size and mediastinal contours are stable. LEFT lung is clear. LEFT chest wall pacemaker/ICD apparatus appears stable. Chronic appearing compression deformities of several vertebral bodies at the level of the midthoracic spine. No acute appearing osseous abnormality. IMPRESSION: 1. RIGHT-sided pneumothorax, moderate in size. 2. Probable RIGHT basilar atelectasis. These results were called by telephone at the time of interpretation on 10/13/2018 at 12:47 pm to Dr. Jeannine Kitten, who verbally  acknowledged these results. Electronically Signed   By: Franki Cabot M.D.   On: 10/13/2018 12:51   Dg Chest Port 1 View  Result Date: 10/18/2018 CLINICAL DATA:  Pneumothorax EXAM: PORTABLE CHEST 1 VIEW COMPARISON:  Chest radiograph from one day prior. FINDINGS: Stable configuration of 2 lead left subclavian ICD. Stable right basilar pigtail chest tube. Stable cardiomediastinal silhouette with normal heart size. Tiny right apical pneumothorax, less than 5%, not definitely seen on prior. No left pneumothorax. No pleural effusion. Patchy lower right lung opacity is similar. No pulmonary edema. No mediastinal shift. IMPRESSION: 1. Tiny right apical pneumothorax, less than 5%, not definitely seen on prior. Right pigtail chest tube in place. No mediastinal shift. 2. Stable patchy lower right lung opacity. Electronically Signed   By: Ilona Sorrel M.D.   On: 10/18/2018 09:46   Dg Chest Port 1 View  Result Date: 10/17/2018 CLINICAL DATA:  85 year old male with history of pneumothorax. Chest tube evaluation. EXAM: PORTABLE CHEST 1 VIEW COMPARISON:  Chest x-ray 10/16/2018. FINDINGS: Small bore right-sided chest tube has shifted in position, now with the pigtail in place projecting over the mid right hemithorax. No appreciable right-sided pneumothorax is identified on today's examination. There continues to be some subcutaneous emphysema in the right lateral chest wall. Lung volumes are low. Irregular opacities in the right mid to lower lung may reflect areas of atelectasis and/or consolidation. Overall, aeration appears slightly improved. Left lung is clear. No pleural effusions. No evidence of pulmonary edema. Heart size is mildly enlarged. Upper mediastinal contours are within normal limits. Aortic atherosclerosis. Left-sided pacemaker/AICD with lead tips projecting over the expected location of the right atrium and right ventricular apex. IMPRESSION: 1. Support apparatus, as above. 2. No definite residual  pneumothorax. 3. Ill-defined opacities in the right mid to lower lung may reflect areas of residual atelectasis and/or consolidation, with slight improving aeration. 4. Mild cardiomegaly. 5. Aortic atherosclerosis. Electronically Signed   By: Vinnie Langton M.D.   On: 10/17/2018 07:50   Dg Chest Port 1 View  Result Date: 10/16/2018 CLINICAL DATA:  Chest tube placement.  Pleural effusion. EXAM: PORTABLE CHEST 1 VIEW COMPARISON:  July 17, 2018 FINDINGS: Chest tube remains on the right, currently with the pigtail more laterally and inferiorly position than 1 day prior. There is a minimal right apical pneumothorax. There is a small amount of residual pleural effusion on the right, less than on 1 day prior. There is atelectatic change in the right base. The left  lung is clear. Heart is upper normal in size with pulmonary vascularity normal. Pacemaker leads are attached to the right atrium and right ventricle. There is aortic atherosclerosis. No adenopathy. Bones are osteoporotic. IMPRESSION: Chest tube remains on the right, although the pigtail is more inferiorly and laterally positioned than on 1 day prior. There is a minimal right apical pneumothorax. There is a small residual right effusion with right base atelectasis. Left lung clear. Stable cardiac silhouette. Aortic Atherosclerosis (ICD10-I70.0). Electronically Signed   By: Lowella Grip III M.D.   On: 10/16/2018 07:53   Dg Chest Port 1 View  Result Date: 10/15/2018 CLINICAL DATA:  85 year old male with history of pneumothorax. Chest tube placed. EXAM: PORTABLE CHEST 1 VIEW COMPARISON:  Chest x-ray 10/14/2018. FINDINGS: Right-sided chest tube in position with pigtail reformed in the medial aspect of the right hemithorax. No appreciable pneumothorax. Small to moderate right pleural effusion, small right pleural effusion, similar to the prior examination. Irregular opacities in the right mid to lower lung may reflect associated atelectasis and/or  airspace consolidation. Left lung is clear. No left pleural effusion. No evidence of pulmonary edema. Heart size is upper limits of normal. Upper mediastinal contours are within normal limits. Aortic atherosclerosis. Left-sided pacemaker/AICD with leads projecting over the expected location of the right atrium and right ventricle. IMPRESSION: 1. Support apparatus, as above. 2. No residual pneumothorax identified. Right-sided chest tube appears stable in position, but there continues to be a small right pleural effusion with some associated atelectasis and/or consolidation throughout the right mid to lower lung. 3. Aortic atherosclerosis. Electronically Signed   By: Vinnie Langton M.D.   On: 10/15/2018 08:27   Dg Chest Port 1 View  Result Date: 10/14/2018 CLINICAL DATA:  Pneumothorax, chest tube EXAM: PORTABLE CHEST 1 VIEW COMPARISON:  10/13/2018 FINDINGS: Right chest tube has been placed with re-expansion of the right lung. No visible pneumothorax currently. Right lower lobe consolidation. Left lung clear. Heart is mildly enlarged. Left AICD is unchanged. IMPRESSION: Interval placement of right chest tube with re-expansion of the right lung. No visible pneumothorax. Right lower lobe consolidation. Cardiomegaly. Electronically Signed   By: Rolm Baptise M.D.   On: 10/14/2018 08:13   Dg C-arm 1-60 Min  Result Date: 10/12/2018 CLINICAL DATA:  Open reduction internal fixation for fracture EXAM: OPERATIVE RIGHT HIP  2 VIEWS TECHNIQUE: Fluoroscopic spot image(s) were submitted for interpretation post-operatively. COMPARISON:  Preoperative right hip region radiographs Oct 10, 2018 FLUOROSCOPY TIME:  0 minutes 53 seconds; 6 acquired images FINDINGS: Frontal and lateral views obtained. There is screw and nail fixation through a fracture of the intertrochanteric portion of the proximal right femur. The tip of the screws in the proximal femoral head. There is avulsion of the lesser trochanter, unchanged from  preoperative study. No dislocation. Right hip joint appears intact. IMPRESSION: Screw and nail fixation through comminuted fracture of the proximal right intertrochanteric region. Persistent avulsion lesser trochanter. No dislocation. Tip of screw in proximal femoral head. Electronically Signed   By: Lowella Grip III M.D.   On: 10/12/2018 12:16   Dg Hip Operative Unilat W Or W/o Pelvis Right  Result Date: 10/12/2018 CLINICAL DATA:  Open reduction internal fixation for fracture EXAM: OPERATIVE RIGHT HIP  2 VIEWS TECHNIQUE: Fluoroscopic spot image(s) were submitted for interpretation post-operatively. COMPARISON:  Preoperative right hip region radiographs Oct 10, 2018 FLUOROSCOPY TIME:  0 minutes 53 seconds; 6 acquired images FINDINGS: Frontal and lateral views obtained. There is screw and nail fixation through a  fracture of the intertrochanteric portion of the proximal right femur. The tip of the screws in the proximal femoral head. There is avulsion of the lesser trochanter, unchanged from preoperative study. No dislocation. Right hip joint appears intact. IMPRESSION: Screw and nail fixation through comminuted fracture of the proximal right intertrochanteric region. Persistent avulsion lesser trochanter. No dislocation. Tip of screw in proximal femoral head. Electronically Signed   By: Lowella Grip III M.D.   On: 10/12/2018 12:16   Dg Hip Unilat With Pelvis 2-3 Views Left  Result Date: 10/10/2018 CLINICAL DATA:  Right hip fracture. [place] out of a wheelchair 2 days ago. EXAM: DG HIP (WITH OR WITHOUT PELVIS) 2-3V LEFT COMPARISON:  Pelvic x-ray dated September 05, 2018. FINDINGS: Acute comminuted, displaced, and angulated right intertrochanteric femur fracture. No dislocation. Unchanged left hip hemiarthroplasty. No evidence of hardware failure or loosening. Old fracture of the right inferior pubic ramus again noted. The pubic symphysis and sacroiliac joints are intact. Osteopenia. Soft tissues are  unremarkable. IMPRESSION: 1. Acute right intertrochanteric femur fracture. 2. Unchanged left hip hemiarthroplasty without hardware complication. Electronically Signed   By: Titus Dubin M.D.   On: 10/10/2018 16:15   Dg Hip Unilat With Pelvis 2-3 Views Right  Result Date: 10/10/2018 CLINICAL DATA:  Fall, hip fracture EXAM: DG HIP (WITH OR WITHOUT PELVIS) 2-3V RIGHT COMPARISON:  10/10/2018 FINDINGS: There is an acute displaced and angulated right hip intertrochanteric fracture. Bones are osteopenic. No subluxation or dislocation. Peripheral atherosclerosis noted. Remote right inferior ramus fracture with healed deformity. IMPRESSION: Acute displaced and angulated right hip intertrochanteric fracture. Electronically Signed   By: Jerilynn Mages.  Shick M.D.   On: 10/10/2018 17:17   Ct Perc Pleural Drain W/indwell Cath W/img Guide  Result Date: 10/13/2018 CLINICAL DATA:  Hypoxia. Right pneumothorax noted. Recent anesthesia for fixation of right femur neck fracture. EXAM: CT GUIDED CHEST TUBE PLACEMENT ANESTHESIA/SEDATION: Intravenous Fentanyl 49mcg and Versed 0.5mg  were administered as conscious sedation during continuous monitoring of the patient's level of consciousness and physiological / cardiorespiratory status by the radiology RN, with a total moderate sedation time of 13 minutes. PROCEDURE: The procedure, risks, benefits, and alternatives were explained to the son. Questions regarding the procedure were encouraged and answered. The son understands and consents to the procedure. Select scans through the thorax were obtained. The right pneumothorax was localized and an appropriate skin entry site was determined and marked. The operative field was prepped with chlorhexidinein a sterile fashion, and a sterile drape was applied covering the operative field. A sterile gown and sterile gloves were used for the procedure. Local anesthesia was provided with 1% Lidocaine. Under CT guidance, a 19 gauge percutaneous entry  needle was advanced into the right pleural space. Gas could be aspirated. An Amplatz wire advanced centrally easily. Tract was dilated to facilitate placement of a 14 French pigtail catheter. CT confirms good position with evacuation of the pneumothorax. The catheter was secured externally with 0 Prolene suture and StatLock, and placed to Pleur-evac -20 cm H2O suction. The patient tolerated the procedure well. COMPLICATIONS: None immediate FINDINGS: Simple appearing moderate right pneumothorax was localized. 14 French pigtail chest tube placed as above. IMPRESSION: 1. Technically successful CT-guided right chest tube placement Electronically Signed   By: Lucrezia Europe M.D.   On: 10/13/2018 14:57    Labs:  CBC: Recent Labs    10/16/18 0143 10/17/18 0150 10/18/18 0452 10/19/18 0246  WBC 16.7* 20.5* 16.9* 18.3*  HGB 8.1* 8.6* 8.0* 8.3*  HCT 26.1* 27.4* 25.7*  27.3*  PLT 296 311 355 389    COAGS: Recent Labs    10/10/18 1300 10/11/18 0327  INR 1.2 1.2  APTT 36  --     BMP: Recent Labs    10/16/18 0143 10/17/18 0150 10/18/18 0452 10/19/18 0246  NA 142 141 139 139  K 4.4 4.5 4.3 4.3  CL 108 107 104 105  CO2 29 27 26 27   GLUCOSE 120* 113* 112* 113*  BUN 27* 26* 30* 24*  CALCIUM 8.8* 8.7* 8.3* 8.7*  CREATININE 0.81 0.80 0.86 0.83  GFRNONAA >60 >60 >60 >60  GFRAA >60 >60 >60 >60    LIVER FUNCTION TESTS: Recent Labs    10/10/18 1300 10/11/18 0327  BILITOT 0.7 0.8  AST 18 15  ALT 19 18  ALKPHOS 126 116  PROT 5.4* 4.7*  ALBUMIN 2.6* 2.2*    TUMOR MARKERS: No results for input(s): AFPTM, CEA, CA199, CHROMGRNA in the last 8760 hours.  Assessment and Plan:  Pneumothorax, s/p chest tube by Dr. Vernard Gambles 5/25.  Images reviewed by Dr. Barbie Banner.   Chest tube removed without difficulty.  No recurrent pneumothorax on CXR post tube removal.  Remove chest dressing on Tuesday.  Electronically Signed: Murrell Redden, PA-C   10/19/2018, 12:57 PM      I spent a total of  15  Minutes  in face to face in clinical consultation, greater than 50% of which was counseling/coordinating care for chest tube removal.

## 2018-10-19 NOTE — Progress Notes (Addendum)
  Chart check.  X-ray showed small Apical pneumothorax.  Patient remains to suction.  Check X-ray again tomorrow.   If stable, consider water seal.  Sruthi Maurer S Tegh Franek PA-C 10/19/2018 8:39 AM

## 2018-10-19 NOTE — Plan of Care (Signed)
  Problem: Activity: Goal: Risk for activity intolerance will decrease Outcome: Progressing   Problem: Safety: Goal: Ability to remain free from injury will improve Outcome: Progressing   Problem: Skin Integrity: Goal: Risk for impaired skin integrity will decrease Outcome: Progressing   

## 2018-10-20 LAB — BASIC METABOLIC PANEL
Anion gap: 7 (ref 5–15)
BUN: 28 mg/dL — ABNORMAL HIGH (ref 8–23)
CO2: 25 mmol/L (ref 22–32)
Calcium: 8.5 mg/dL — ABNORMAL LOW (ref 8.9–10.3)
Chloride: 106 mmol/L (ref 98–111)
Creatinine, Ser: 0.86 mg/dL (ref 0.61–1.24)
GFR calc Af Amer: >60 mL/min (ref >60)
GFR calc non Af Amer: >60 mL/min (ref >60)
Glucose, Bld: 110 mg/dL — ABNORMAL HIGH (ref 70–99)
Potassium: 5.5 mmol/L — ABNORMAL HIGH (ref 3.5–5.1)
Sodium: 138 mmol/L (ref 135–145)

## 2018-10-20 LAB — SARS CORONAVIRUS 2 BY RT PCR (HOSPITAL ORDER, PERFORMED IN ~~LOC~~ HOSPITAL LAB): SARS Coronavirus 2: NEGATIVE

## 2018-10-20 NOTE — Progress Notes (Addendum)
Family Medicine Teaching Service Daily Progress Note Intern Pager: [phone]  Patient name: Maurice Mckee Medical record number: [id number] Date of birth: [birth year] Age: 85 y.o. Gender: male  Primary Care Provider: Patient, No Pcp Per Consultants: Ortho, IR Code Status: Full   Pt Overview and Major Events to Date:  5/22 admitted for R hip fracture 5/24 - IM fixiation of right femur 5/25 - Right Pneumothorax s/p right chest tube placement  Assessment and Plan: Maurice Mckee is a 85 y.o. male presenting from SNF after right hip fracture. PMH is significant for left hip fracture s/p repair on 09/04/18 (SNF at [facility]), recent h/o PNA in April, severe dementia, CHF (EF 45-50% on 04/13/18) , HTN,  HLD, and vitamin D deficiency  Leukocytosis: improving WBC overall stable 20>16>18. Continues to remain afebrile off of tylenol and hemodynamically stable.  - Continue to monitor - If develops fever, obtain CBC, CMP, UA, blood/urine culture, repeat CXR and start antibiotics - will need outpatient follow up of WBC  Right Pneumothorax s/p right thoracostomy: stable S/p placement CT guided right thoracostomy on 5/25.  No signs of infection, air leaks, or bleeding. Bandage clean and intact. Chest tube to water seal 5/30 removed 5/31. CXR on 5/31 without pneumothorax. Remains on RA.  IR had noted chest dressing to be removed on 6/2. -We will reach out to IR to see if patient needs to stay for removal on 6/2 or if patient can DC to SNF and have outpatient follow-up - wean O2 as tolerated  Right femoral fracture s/p IM  H/o Multiple Falls: stable Continues to work with PT/OT. Plan is to discharge back to SNF. No signs of infection, bleeding, or hematoma on inspection. Staples intact, incision well appearing. - Per Orthopedics WBAT, wound care, and follow up in 2 wks for staple removal, ice to hip  - Resume ASA/plavix on discharge   - Tylenol and tramadol PRN for pain, none received >48hrs. -  Continue lovenox and SCDs for DVT prophylaxis - PT/OT- recommend SNF, 3n1 bedside commode, wheelchair with cushion  Macrocytic Anemia: stable Hgb stable at 8, BL 12-13. MCV 100s, was also elevated in 2010. No ongoing signs of bleeding. Likely 2/2 inflammation and blood loss during surgery given low iron and high ferritin. Folate low. Consider peripheral smear outpatient. - daily CBC - cont home iron - consider B12, smear, retic count outpatient  HFrEF (45-50%)  Pacemaker: Euvolemic on exam, no U OP charted on 5/31. 800cc urine in foley bag on my exam. - continue home Coreg 12.5mg  BID - restart asa, plavix on discharge - Continue home lasix - daily BMP - monitor fluid status  Advanced Dementia: Home meds: Aricept and Namenda Mental status waxes and wanes daily. Mumbled speech, no dentures in place. Awakens to voice. - hold home meds, may stop during this hospital stay given patient likely not benefiting from these medications  Severe Protein calorie malnutrition: Albumin 2.6. Cachetic appearing on exam.  - Nutrition consulted, appreciate recs - MVTI, Ensure BID, Hormel shake TID with meals, Magic Cup TID w/ meals - feeding assistance  FEN/GI: DYS 1 diet Prophylaxis: SCDs  Disposition: SNF   Subjective:  No acute events overnight.  Patient appears comfortable.  Objective: Temp:  [98 F (36.7 C)-98.1 F (36.7 C)] 98 F (36.7 C) (05/31 2003) Pulse Rate:  [60-82] 71 (06/01 0333) Resp:  [15-20] 17 (06/01 0333) BP: (95-150)/(53-84) 150/75 (06/01 0333) SpO2:  [97 %-98 %] 97 % (05/31 2003)  Physical Exam:  General: 85  y.o. male in NAD Cardio: RRR no m/r/g Lungs: CTAB, no wheezing, no rhonchi, no crackles, no IWOB on RA Abdomen: Soft, non-tender to palpation, non-distended, positive bowel sounds Skin: warm and dry Extremities: No edema, staples in right hip, clean and non-erythematous surrounding tissue   Laboratory: Recent Labs  Lab 10/17/18 0150 10/18/18 0452  10/19/18 0246  WBC 20.5* 16.9* 18.3*  HGB 8.6* 8.0* 8.3*  HCT 27.4* 25.7* 27.3*  PLT 311 355 389   Recent Labs  Lab 10/18/18 0452 10/19/18 0246 10/20/18 0249  NA 139 139 138  K 4.3 4.3 5.5*  CL 104 105 106  CO2 26 27 25   BUN 30* 24* 28*  CREATININE 0.86 0.83 0.86  CALCIUM 8.3* 8.7* 8.5*  GLUCOSE 112* 113* 110*   TSH 3.805 EKG NSR  Iron/TIBC/Ferritin/ %Sat    Component Value Date/Time   IRON 16 (L) 10/11/2018 0327   TIBC 176 (L) 10/11/2018 0327   FERRITIN 393 (H) 10/11/2018 0327   IRONPCTSAT 9 (L) 10/11/2018 0327   Imaging/Diagnostic Tests: Dg Chest 1 View  Result Date: 10/19/2018 CLINICAL DATA:  Status post right chest tube removal today. EXAM: CHEST  1 VIEW COMPARISON:  Single-view of the chest earlier today and 10/18/2018. FINDINGS: Pigtail catheter has been removed from the right chest. No pneumothorax. Right infrahilar opacity is unchanged. Left lung clear. Heart size normal. Aortic atherosclerosis noted. No pleural effusion. AICD in place. IMPRESSION: Negative for pneumothorax after chest tube removal. No other change since the exam earlier today. Electronically Signed   By: Inge Rise M.D.   On: 10/19/2018 11:09   Dg Chest 1 View  Result Date: 10/19/2018 CLINICAL DATA:  Follow-up pneumothorax EXAM: CHEST  1 VIEW COMPARISON:  Chest radiograph from one day prior. FINDINGS: Stable configuration of 2 lead left subclavian ICD. Right pigtail chest tube in place. Stable cardiomediastinal silhouette with normal heart size. No appreciable residual pneumothorax. No significant pleural effusion. No pulmonary edema. Patchy right infrahilar opacity appears stable. Clear left lung. IMPRESSION: 1. No appreciable residual pneumothorax.  Right chest tube in place. 2. Stable patchy right infrahilar opacity. Continued chest radiograph follow-up advised. Electronically Signed   By: Ilona Sorrel M.D.   On: 10/19/2018 09:57   Meccariello, Bernita Raisin, DO 10/20/2018, 8:34 AM PGY-1, [place] Intern pager: [phone], text pages welcome

## 2018-10-20 NOTE — Progress Notes (Signed)
Physical Therapy Treatment Patient Details Name: Maurice Mckee MRN: [id number] DOB: 1926/03/29 Today's Date: 10/20/2018    History of Present Illness 85 yo male admitted from [place] [facility] s/p fall for R IM nail PMH: L hemiarthroplasty 09/04/18 PNA Dementia    PT Comments    Pt supine on arrival sleeping with his son at bedside.  He required increased time to rouse but able to stay awake to participate in transfer OOB to recliner chair.  Pt initially refusing to stand once seated edge of bed.  After standing his son was able to convince he to take a few steps forward away from bed before he started to sit prematurely.  Chair brought up to patient's hips to return to sitting safely.  Continue to recommend SNF placement at this time.     Follow Up Recommendations  SNF;Supervision/Assistance - 24 hour     Equipment Recommendations  None recommended by PT    Recommendations for Other Services       Precautions / Restrictions Precautions Precautions: Fall Restrictions Weight Bearing Restrictions: Yes RLE Weight Bearing: Weight bearing as tolerated    Mobility  Bed Mobility Overal bed mobility: Needs Assistance Bed Mobility: Supine to Sit     Supine to sit: Mod assist;+2 for physical assistance     General bed mobility comments: Assist for LEs and to elevate trunk OOB.  Transfers Overall transfer level: Needs assistance Equipment used: Rolling walker (2 wheeled) Transfers: Sit to/from Stand Sit to Stand: Max assist;+2 physical assistance         General transfer comment: Increased assistance to achieve standing with facilitation to extend B hips and elevate upper trunk.    Ambulation/Gait Ambulation/Gait assistance: Max assist;+2 physical assistance Gait Distance (Feet): 4 Feet Assistive device: Rolling walker (2 wheeled) Gait Pattern/deviations: Step-to pattern;Shuffle;Antalgic;Narrow base of support     General Gait Details: Pt able to progress steps away  from edge of bed with recliner brought up behind him.  He refused to ambulate further today and requested to sit after short trial.  Son present and happy to see his dad walking.     Stairs             Wheelchair Mobility    Modified Rankin (Stroke Patients Only)       Balance Overall balance assessment: Needs assistance   Sitting balance-Leahy Scale: Fair       Standing balance-Leahy Scale: Poor                              Cognition Arousal/Alertness: Awake/alert Behavior During Therapy: WFL for tasks assessed/performed Overall Cognitive Status: History of cognitive impairments - at baseline                                 General Comments: dementia at baseline      Exercises      General Comments        Pertinent Vitals/Pain Pain Assessment: Faces Faces Pain Scale: Hurts even more Pain Location: R leg Pain Descriptors / Indicators: Discomfort;Grimacing Pain Intervention(s): Monitored during session;Repositioned    Home Living                      Prior Function            PT Goals (current goals can now be found in the care plan section)  Acute Rehab PT Goals Patient Stated Goal: To comb his hair Potential to Achieve Goals: Fair Progress towards PT goals: Progressing toward goals    Frequency    Min 3X/week      PT Plan Current plan remains appropriate    Co-evaluation              AM-PAC PT "6 Clicks" Mobility   Outcome Measure  Help needed turning from your back to your side while in a flat bed without using bedrails?: A Lot Help needed moving from lying on your back to sitting on the side of a flat bed without using bedrails?: A Lot Help needed moving to and from a bed to a chair (including a wheelchair)?: A Lot Help needed standing up from a chair using your arms (e.g., wheelchair or bedside chair)?: A Lot Help needed to walk in hospital room?: Total Help needed climbing 3-5 steps with a  railing? : Total 6 Click Score: 10    End of Session Equipment Utilized During Treatment: Gait belt Activity Tolerance: Patient tolerated treatment well Patient left: in chair;with call bell/phone within reach;with chair alarm set Nurse Communication: Mobility status PT Visit Diagnosis: Unsteadiness on feet (R26.81);Other abnormalities of gait and mobility (R26.89);Muscle weakness (generalized) (M62.81)     Time: 1761-6073 PT Time Calculation (min) (ACUTE ONLY): 15 min  Charges:  $Therapeutic Activity: 8-22 mins                     Governor Rooks, PTA Acute Rehabilitation Services Pager [phone] Office [phone]     Cyndie Woodbeck Eli Hose 10/20/2018, 2:52 PM

## 2018-10-20 NOTE — Progress Notes (Signed)
CM spoke to pts son over the phone and he states the family has decided for the patient to return to SNF rehab at [facility]. CSW updated.

## 2018-10-21 LAB — BASIC METABOLIC PANEL
Anion gap: 8 (ref 5–15)
BUN: 23 mg/dL (ref 8–23)
CO2: 26 mmol/L (ref 22–32)
Calcium: 8.6 mg/dL — ABNORMAL LOW (ref 8.9–10.3)
Chloride: 105 mmol/L (ref 98–111)
Creatinine, Ser: 0.75 mg/dL (ref 0.61–1.24)
GFR calc Af Amer: >60 mL/min (ref >60)
GFR calc non Af Amer: >60 mL/min (ref >60)
Glucose, Bld: 105 mg/dL — ABNORMAL HIGH (ref 70–99)
Potassium: 4.4 mmol/L (ref 3.5–5.1)
Sodium: 139 mmol/L (ref 135–145)

## 2018-10-21 LAB — CBC
HCT: 31.7 % — ABNORMAL LOW (ref 39.0–52.0)
Hemoglobin: 9.6 g/dL — ABNORMAL LOW (ref 13.0–17.0)
MCH: 31.2 pg (ref 26.0–34.0)
MCHC: 30.3 g/dL (ref 30.0–36.0)
MCV: 102.9 fL — ABNORMAL HIGH (ref 80.0–100.0)
Platelets: 404 10*3/uL — ABNORMAL HIGH (ref 150–400)
RBC: 3.08 MIL/uL — ABNORMAL LOW (ref 4.22–5.81)
RDW: 15.7 % — ABNORMAL HIGH (ref 11.5–15.5)
WBC: 18.8 10*3/uL — ABNORMAL HIGH (ref 4.0–10.5)
nRBC: 0 % (ref 0.0–0.2)

## 2018-10-21 MED ORDER — DOCUSATE SODIUM 100 MG PO CAPS: 100.0000 mg | ORAL_CAPSULE | Freq: Two times a day (BID) | ORAL | 0 refills | Status: AC

## 2018-10-21 MED ORDER — BOOST PLUS PO LIQD: 237.0000 mL | Freq: Two times a day (BID) | ORAL | 0 refills | Status: DC

## 2018-10-21 MED ORDER — ADULT MULTIVITAMIN W/MINERALS CH: 1.0000 | ORAL_TABLET | Freq: Every day | ORAL | Status: DC

## 2018-10-21 NOTE — Social Work (Signed)
Clinical Social Worker facilitated patient discharge including contacting patient family and facility to confirm patient discharge plans.  Clinical information faxed to facility and family agreeable with plan.  CSW arranged ambulance transport via [facility] to [facility] at 1:30pm. RN to call [phone]  with report prior to discharge.  Clinical Social Worker will sign off for now as social work intervention is no longer needed. Please consult [place] again if new need arises.  Westley Hummer, MSW, [place] Social Worker [phone]

## 2018-10-21 NOTE — Progress Notes (Signed)
Patient discharged  to [facility] facility via [place]. Report given to [facility].discharged instructions,personal belongings given

## 2018-10-21 NOTE — Progress Notes (Signed)
Spoke with Dr. Annamaria Boots, IR, who noted patient's dressing could be removed today and bandaid placed.  Will call nurse to perform.  [place] Constable, D.O.  PGY-1 Family Medicine  10/21/2018 12:08 PM

## 2018-10-21 NOTE — Progress Notes (Signed)
Family Medicine Teaching Service Daily Progress Note Intern Pager: [phone]  Patient name: Maurice Mckee Medical record number: [id number] Date of birth: 1925-11-16 Age: 85 y.o. Gender: male  Primary Care Provider: Patient, No Pcp Per Consultants: Ortho, IR Code Status: Full   Pt Overview and Major Events to Date:  5/22 admitted for R hip fracture 5/24 - IM fixiation of right femur 5/25 - Right Pneumothorax s/p right chest tube placement 5/31 - Chest Tube removed  Assessment and Plan: Maurice Mckee is a 85 y.o. male presenting from SNF after right hip fracture. PMH is significant for left hip fracture s/p repair on 09/04/18 (SNF at [facility]), recent h/o PNA in April, severe dementia, CHF (EF 45-50% on 04/13/18) , HTN,  HLD, and vitamin D deficiency  Leukocytosis: improving WBC overall stable 20>16>18>18.8. Continues to remain afebrile off of tylenol and hemodynamically stable.  - Continue to monitor - If develops fever, obtain CBC, CMP, UA, blood/urine culture, repeat CXR and start antibiotics - will need outpatient follow up of WBC  Right Pneumothorax s/p right thoracostomy: stable S/p placement CT guided right thoracostomy on 5/25.  No signs of infection, air leaks, or bleeding. Bandage clean and intact. Chest tube to water seal 5/30 removed 5/31. CXR on 5/31 without pneumothorax.  Patient placed on 2 L O2 overnight, see O2 87% in chart, but no other desats.  Spoke with nurse who noted she didn't know why patient was on oxygen, but he had pulled it off this AM and was breathing comfortably on RA. -IR to remove bandage today - wean O2 as tolerated  Right femoral fracture s/p IM  H/o Multiple Falls: stable Continues to work with PT/OT. Plan is to discharge back to SNF. No signs of infection, bleeding, or hematoma on inspection.  Staples remain intact, incision well-appearing - Per Orthopedics WBAT, wound care, and follow up in 2 wks for staple removal, ice to hip  - Resume  ASA/plavix on discharge   - Tylenol and tramadol PRN for pain, none received >48hrs. - Continue lovenox and SCDs for DVT prophylaxis - PT/OT- recommend SNF, 3n1 bedside commode, wheelchair with cushion  Macrocytic Anemia: stable Hgb stable at 8, BL 12-13. MCV 100s, was also elevated in 2010. No ongoing signs of bleeding. Likely 2/2 inflammation and blood loss during surgery given low iron and high ferritin. Folate low. Consider peripheral smear outpatient. - daily CBC - cont home iron - consider B12, smear, retic count outpatient  HFrEF (45-50%)  Pacemaker: Euvolemic on exam. - continue home Coreg 12.5mg  BID - restart asa, plavix on discharge - Continue home lasix - daily BMP - monitor fluid status  Advanced Dementia: Home meds: Aricept and Namenda Mental status waxes and wanes daily. Mumbled speech, no dentures in place. Awakens to voice. - hold home meds, may stop during this hospital stay given patient likely not benefiting from these medications  Severe Protein calorie malnutrition: Albumin 2.6. Cachetic appearing on exam.  - Nutrition consulted, appreciate recs - MVTI, Ensure BID, Hormel shake TID with meals, Magic Cup TID w/ meals - feeding assistance  FEN/GI: DYS 1 diet Prophylaxis: SCDs  Disposition: SNF, likely today  Subjective:  No acute events overnight.  Patient placed on 2 L O2 per nasal cannula overnight with oxygen to 87, on no other desaturations noted.  Patient resting comfortably on room air this morning.  Objective: Temp:  [97.5 F (36.4 C)-97.9 F (36.6 C)] 97.9 F (36.6 C) (06/02 0430) Pulse Rate:  [75-76] 75 (06/02 0430)  Resp:  [14] 14 (06/02 0430) BP: (89-140)/(61-80) 140/80 (06/02 0430) SpO2:  [87 %-93 %] 90 % (06/02 0430)  Physical Exam:  General: 85 y.o. male in NAD Cardio: RRR no m/r/g Lungs: CTAB, no wheezing, no rhonchi, no crackles, no IWOB on RA, bandage in place on right chest wall Abdomen: Soft, non-tender to palpation,  non-distended, positive bowel sounds Skin: warm and dry Extremities: No edema Neuro: opens eyes during exam, non-verbal   Laboratory: Recent Labs  Lab 10/18/18 0452 10/19/18 0246 10/21/18 0425  WBC 16.9* 18.3* 18.8*  HGB 8.0* 8.3* 9.6*  HCT 25.7* 27.3* 31.7*  PLT 355 389 404*   Recent Labs  Lab 10/19/18 0246 10/20/18 0249 10/21/18 0425  NA 139 138 139  K 4.3 5.5* 4.4  CL 105 106 105  CO2 27 25 26   BUN 24* 28* 23  CREATININE 0.83 0.86 0.75  CALCIUM 8.7* 8.5* 8.6*  GLUCOSE 113* 110* 105*   TSH 3.805 EKG NSR  Iron/TIBC/Ferritin/ %Sat    Component Value Date/Time   IRON 16 (L) 10/11/2018 0327   TIBC 176 (L) 10/11/2018 0327   FERRITIN 393 (H) 10/11/2018 0327   IRONPCTSAT 9 (L) 10/11/2018 0327   Imaging/Diagnostic Tests: Dg Chest 1 View  Result Date: 10/19/2018 CLINICAL DATA:  Status post right chest tube removal today. EXAM: CHEST  1 VIEW COMPARISON:  Single-view of the chest earlier today and 10/18/2018. FINDINGS: Pigtail catheter has been removed from the right chest. No pneumothorax. Right infrahilar opacity is unchanged. Left lung clear. Heart size normal. Aortic atherosclerosis noted. No pleural effusion. AICD in place. IMPRESSION: Negative for pneumothorax after chest tube removal. No other change since the exam earlier today. Electronically Signed   By: Inge Rise M.D.   On: 10/19/2018 11:09   Dg Chest 1 View  Result Date: 10/19/2018 CLINICAL DATA:  Follow-up pneumothorax EXAM: CHEST  1 VIEW COMPARISON:  Chest radiograph from one day prior. FINDINGS: Stable configuration of 2 lead left subclavian ICD. Right pigtail chest tube in place. Stable cardiomediastinal silhouette with normal heart size. No appreciable residual pneumothorax. No significant pleural effusion. No pulmonary edema. Patchy right infrahilar opacity appears stable. Clear left lung. IMPRESSION: 1. No appreciable residual pneumothorax.  Right chest tube in place. 2. Stable patchy right infrahilar  opacity. Continued chest radiograph follow-up advised. Electronically Signed   By: Ilona Sorrel M.D.   On: 10/19/2018 09:57   Meccariello, Bernita Raisin, DO 10/21/2018, 8:35 AM PGY-1, [place] Intern pager: [phone], text pages welcome

## 2018-10-21 NOTE — TOC Progression Note (Signed)
Transition of Care [facility]) - Progression Note    Patient Details  Name: Maurice Mckee MRN: [id number] Date of Birth: [birth year]  Transition of Care [facility]) CM/SW [place], [place] Phone Number: 10/21/2018, 10:24 AM  Clinical Narrative:    CSW spoke with pt son Francee Piccolo, we discussed that CSW would follow for medical stability and that pt could be cleared by MD team to return to [facility] today. He is aware of second negative COVID screen, and will be updated by both CSW and MD team when pt ready for dc. All questions answered.   Expected Discharge Plan: [place] Barriers to Discharge: Continued Medical Work up  Expected Discharge Plan and Services Expected Discharge Plan: [place] In-house Referral: Clinical Social Work Discharge Planning Services: NA Post Acute Care Choice: [place] Living arrangements for the past 2 months: [place]                 DME Arranged: N/A DME Agency: NA       HH Arranged: NA HH Agency: NA         Social Determinants of Health (SDOH) Interventions    Readmission Risk Interventions No flowsheet data found.

## 2018-10-21 NOTE — Care Management Important Message (Signed)
Important Message  Patient Details  Name: Heston Widener MRN: [id number] Date of Birth: 03-25-1926   Medicare Important Message Given:  Yes   Due to illness patient is not able to sign.  Unsigned copy left at bedside Orbie Pyo 10/21/2018, 12:12 PM

## 2018-10-21 NOTE — Plan of Care (Signed)
  Problem: Activity: Goal: Ability to ambulate and perform ADLs will improve Outcome: Progressing   Problem: Nutrition: Goal: Adequate nutrition will be maintained Outcome: Progressing   Problem: Elimination: Goal: Will not experience complications related to bowel motility Outcome: Progressing   Problem: Safety: Goal: Ability to remain free from injury will improve Outcome: Progressing   Problem: Skin Integrity: Goal: Risk for impaired skin integrity will decrease Outcome: Progressing

## 2018-10-21 NOTE — Discharge Instructions (Signed)
-   ok for full weight bearing as tolerated to the right leg - maintain post op bandages for 2 weeks, may shower with this bandage in place.  If it becomes soiled replace with daily dry dressing. - return to see Dr. Stann Mainland in 2 weeks - resume preoperative aspirin for post operative prevention of DVT.  See discharge summary for other follow up instructions.

## 2018-10-23 NOTE — Anesthesia Postprocedure Evaluation (Signed)
Anesthesia Post Note  Patient: Nori Poland  Procedure(s) Performed: INTRAMEDULLARY (IM) NAIL INTERTROCHANTRIC (Right )     Patient location during evaluation: PACU Anesthesia Type: General Level of consciousness: awake and alert Pain management: pain level controlled Vital Signs Assessment: post-procedure vital signs reviewed and stable Respiratory status: spontaneous breathing, nonlabored ventilation, respiratory function stable and patient connected to nasal cannula oxygen Cardiovascular status: blood pressure returned to baseline and stable Postop Assessment: no apparent nausea or vomiting Anesthetic complications: no    Last Vitals:  Vitals:   10/21/18 0430 10/21/18 1334  BP: 140/80 115/60  Pulse: 75 66  Resp: 14 14  Temp: 36.6 C 36.4 C  SpO2: 90%     Last Pain:  Vitals:   10/21/18 1334  TempSrc: Oral  PainSc:                  Trenesha Alcaide

## 2018-11-12 ENCOUNTER — Inpatient Hospital Stay (HOSPITAL_COMMUNITY)
Admission: EM | Admit: 2018-11-12 | Discharge: 2018-11-18 | DRG: 884 | Disposition: A | Discharge status: Skilled Nursing Facility | Payer: Medicare Other | Source: Skilled Nursing Facility | Attending: Family Medicine | Admitting: Family Medicine

## 2018-11-12 ENCOUNTER — Emergency Department (HOSPITAL_COMMUNITY): Payer: Medicare Other

## 2018-11-12 ENCOUNTER — Other Ambulatory Visit: Payer: Self-pay

## 2018-11-12 DIAGNOSIS — Z79899 Other long term (current) drug therapy: Secondary | ICD-10-CM

## 2018-11-12 DIAGNOSIS — G301 Alzheimer's disease with late onset: Secondary | ICD-10-CM

## 2018-11-12 DIAGNOSIS — Z681 Body mass index (BMI) 19 or less, adult: Secondary | ICD-10-CM

## 2018-11-12 DIAGNOSIS — F0390 Unspecified dementia without behavioral disturbance: Secondary | ICD-10-CM | POA: Diagnosis not present

## 2018-11-12 DIAGNOSIS — J449 Chronic obstructive pulmonary disease, unspecified: Secondary | ICD-10-CM | POA: Diagnosis present

## 2018-11-12 DIAGNOSIS — F0280 Dementia in other diseases classified elsewhere without behavioral disturbance: Secondary | ICD-10-CM | POA: Diagnosis not present

## 2018-11-12 DIAGNOSIS — E739 Lactose intolerance, unspecified: Secondary | ICD-10-CM | POA: Diagnosis present

## 2018-11-12 DIAGNOSIS — Z7189 Other specified counseling: Secondary | ICD-10-CM

## 2018-11-12 DIAGNOSIS — J439 Emphysema, unspecified: Secondary | ICD-10-CM | POA: Diagnosis present

## 2018-11-12 DIAGNOSIS — R410 Disorientation, unspecified: Secondary | ICD-10-CM | POA: Diagnosis not present

## 2018-11-12 DIAGNOSIS — I959 Hypotension, unspecified: Secondary | ICD-10-CM | POA: Diagnosis present

## 2018-11-12 DIAGNOSIS — R627 Adult failure to thrive: Secondary | ICD-10-CM | POA: Diagnosis present

## 2018-11-12 DIAGNOSIS — I10 Essential (primary) hypertension: Secondary | ICD-10-CM | POA: Diagnosis present

## 2018-11-12 DIAGNOSIS — I2510 Atherosclerotic heart disease of native coronary artery without angina pectoris: Secondary | ICD-10-CM | POA: Diagnosis present

## 2018-11-12 DIAGNOSIS — I429 Cardiomyopathy, unspecified: Secondary | ICD-10-CM | POA: Diagnosis present

## 2018-11-12 DIAGNOSIS — Z9181 History of falling: Secondary | ICD-10-CM

## 2018-11-12 DIAGNOSIS — G9340 Encephalopathy, unspecified: Secondary | ICD-10-CM | POA: Diagnosis present

## 2018-11-12 DIAGNOSIS — Z7982 Long term (current) use of aspirin: Secondary | ICD-10-CM

## 2018-11-12 DIAGNOSIS — M1611 Unilateral primary osteoarthritis, right hip: Secondary | ICD-10-CM | POA: Diagnosis present

## 2018-11-12 DIAGNOSIS — R4182 Altered mental status, unspecified: Secondary | ICD-10-CM | POA: Diagnosis present

## 2018-11-12 DIAGNOSIS — Z1159 Encounter for screening for other viral diseases: Secondary | ICD-10-CM

## 2018-11-12 DIAGNOSIS — R451 Restlessness and agitation: Secondary | ICD-10-CM

## 2018-11-12 DIAGNOSIS — Z95810 Presence of automatic (implantable) cardiac defibrillator: Secondary | ICD-10-CM

## 2018-11-12 DIAGNOSIS — Z7902 Long term (current) use of antithrombotics/antiplatelets: Secondary | ICD-10-CM

## 2018-11-12 DIAGNOSIS — R55 Syncope and collapse: Secondary | ICD-10-CM | POA: Diagnosis not present

## 2018-11-12 DIAGNOSIS — R64 Cachexia: Secondary | ICD-10-CM | POA: Diagnosis present

## 2018-11-12 DIAGNOSIS — E559 Vitamin D deficiency, unspecified: Secondary | ICD-10-CM | POA: Diagnosis present

## 2018-11-12 DIAGNOSIS — R471 Dysarthria and anarthria: Secondary | ICD-10-CM | POA: Diagnosis present

## 2018-11-12 DIAGNOSIS — I110 Hypertensive heart disease with heart failure: Secondary | ICD-10-CM | POA: Diagnosis present

## 2018-11-12 DIAGNOSIS — F0391 Unspecified dementia with behavioral disturbance: Secondary | ICD-10-CM

## 2018-11-12 DIAGNOSIS — N400 Enlarged prostate without lower urinary tract symptoms: Secondary | ICD-10-CM | POA: Diagnosis present

## 2018-11-12 DIAGNOSIS — F03918 Unspecified dementia, unspecified severity, with other behavioral disturbance: Secondary | ICD-10-CM

## 2018-11-12 DIAGNOSIS — F329 Major depressive disorder, single episode, unspecified: Secondary | ICD-10-CM | POA: Diagnosis present

## 2018-11-12 DIAGNOSIS — I5022 Chronic systolic (congestive) heart failure: Secondary | ICD-10-CM | POA: Diagnosis present

## 2018-11-12 DIAGNOSIS — Z8701 Personal history of pneumonia (recurrent): Secondary | ICD-10-CM

## 2018-11-12 DIAGNOSIS — E785 Hyperlipidemia, unspecified: Secondary | ICD-10-CM | POA: Diagnosis present

## 2018-11-12 LAB — CBC WITH DIFFERENTIAL/PLATELET
Abs Immature Granulocytes: 0.07 10*3/uL (ref 0.00–0.07)
Basophils Absolute: 0.1 10*3/uL (ref 0.0–0.1)
Basophils Relative: 1 %
Eosinophils Absolute: 0.3 10*3/uL (ref 0.0–0.5)
Eosinophils Relative: 2 %
HCT: 35.6 % — ABNORMAL LOW (ref 39.0–52.0)
Hemoglobin: 10.9 g/dL — ABNORMAL LOW (ref 13.0–17.0)
Immature Granulocytes: 1 %
Lymphocytes Relative: 28 %
Lymphs Abs: 3.5 10*3/uL (ref 0.7–4.0)
MCH: 31 pg (ref 26.0–34.0)
MCHC: 30.6 g/dL (ref 30.0–36.0)
MCV: 101.1 fL — ABNORMAL HIGH (ref 80.0–100.0)
Monocytes Absolute: 1.1 10*3/uL — ABNORMAL HIGH (ref 0.1–1.0)
Monocytes Relative: 9 %
Neutro Abs: 7.5 10*3/uL (ref 1.7–7.7)
Neutrophils Relative %: 59 %
Platelets: 233 10*3/uL (ref 150–400)
RBC: 3.52 MIL/uL — ABNORMAL LOW (ref 4.22–5.81)
RDW: 15.1 % (ref 11.5–15.5)
WBC: 12.5 10*3/uL — ABNORMAL HIGH (ref 4.0–10.5)
nRBC: 0 % (ref 0.0–0.2)

## 2018-11-12 LAB — URINALYSIS, ROUTINE W REFLEX MICROSCOPIC
Bilirubin Urine: NEGATIVE
Glucose, UA: NEGATIVE mg/dL
Hgb urine dipstick: NEGATIVE
Ketones, ur: NEGATIVE mg/dL
Leukocytes,Ua: NEGATIVE
Nitrite: NEGATIVE
Protein, ur: NEGATIVE mg/dL
Specific Gravity, Urine: 1.008 (ref 1.005–1.030)
pH: 6 (ref 5.0–8.0)

## 2018-11-12 LAB — COMPREHENSIVE METABOLIC PANEL
ALT: 21 U/L (ref 0–44)
AST: 21 U/L (ref 15–41)
Albumin: 2.6 g/dL — ABNORMAL LOW (ref 3.5–5.0)
Alkaline Phosphatase: 123 U/L (ref 38–126)
Anion gap: 7 (ref 5–15)
BUN: 13 mg/dL (ref 8–23)
CO2: 27 mmol/L (ref 22–32)
Calcium: 8.7 mg/dL — ABNORMAL LOW (ref 8.9–10.3)
Chloride: 105 mmol/L (ref 98–111)
Creatinine, Ser: 1.03 mg/dL (ref 0.61–1.24)
GFR calc Af Amer: >60 mL/min (ref >60)
GFR calc non Af Amer: >60 mL/min (ref >60)
Glucose, Bld: 86 mg/dL (ref 70–99)
Potassium: 3.4 mmol/L — ABNORMAL LOW (ref 3.5–5.1)
Sodium: 139 mmol/L (ref 135–145)
Total Bilirubin: 0.4 mg/dL (ref 0.3–1.2)
Total Protein: 5.1 g/dL — ABNORMAL LOW (ref 6.5–8.1)

## 2018-11-12 LAB — RAPID URINE DRUG SCREEN, HOSP PERFORMED
Amphetamines: NOT DETECTED
Barbiturates: NOT DETECTED
Benzodiazepines: NOT DETECTED
Cocaine: NOT DETECTED
Opiates: NOT DETECTED
Tetrahydrocannabinol: NOT DETECTED

## 2018-11-12 LAB — POCT I-STAT EG7
Acid-Base Excess: 3 mmol/L — ABNORMAL HIGH (ref 0.0–2.0)
Bicarbonate: 30.5 mmol/L — ABNORMAL HIGH (ref 20.0–28.0)
Calcium, Ion: 1.27 mmol/L (ref 1.15–1.40)
HCT: 41 % (ref 39.0–52.0)
Hemoglobin: 13.9 g/dL (ref 13.0–17.0)
O2 Saturation: 17 %
Potassium: 3.6 mmol/L (ref 3.5–5.1)
Sodium: 139 mmol/L (ref 135–145)
TCO2: 32 mmol/L (ref 22–32)
pCO2, Ven: 55.4 mmHg (ref 44.0–60.0)
pH, Ven: 7.348 (ref 7.250–7.430)
pO2, Ven: 15 mmHg — CL (ref 32.0–45.0)

## 2018-11-12 LAB — SARS CORONAVIRUS 2 BY RT PCR (HOSPITAL ORDER, PERFORMED IN ~~LOC~~ HOSPITAL LAB): SARS Coronavirus 2: NEGATIVE

## 2018-11-12 LAB — CBG MONITORING, ED: Glucose-Capillary: 82 mg/dL (ref 70–99)

## 2018-11-12 LAB — AMMONIA: Ammonia: 18 umol/L (ref 9–35)

## 2018-11-12 LAB — ETHANOL: Alcohol, Ethyl (B): <10 mg/dL (ref <10)

## 2018-11-12 LAB — LACTIC ACID, PLASMA
Lactic Acid, Venous: 1.7 mmol/L (ref 0.5–1.9)
Lactic Acid, Venous: 2.1 mmol/L (ref 0.5–1.9)

## 2018-11-12 MED ORDER — ENOXAPARIN SODIUM 40 MG/0.4ML ~~LOC~~ SOLN
40.0000 mg | SUBCUTANEOUS | Status: DC
  Administered 2018-11-13 – 2018-11-17 (×6): 40 mg via SUBCUTANEOUS
  Filled 2018-11-12 (×6): qty 0.4

## 2018-11-12 MED ORDER — ENSURE ENLIVE PO LIQD
237.0000 mL | Freq: Two times a day (BID) | ORAL | Status: DC
  Administered 2018-11-13 (×3): 237 mL via ORAL

## 2018-11-12 MED ORDER — SODIUM CHLORIDE 0.9 % IV SOLN
INTRAVENOUS | Status: AC
  Administered 2018-11-12: 20:00:00 via INTRAVENOUS

## 2018-11-12 MED ORDER — ACETAMINOPHEN 325 MG PO TABS
650.0000 mg | ORAL_TABLET | Freq: Four times a day (QID) | ORAL | Status: DC | PRN
  Filled 2018-11-12: qty 2

## 2018-11-12 MED ORDER — CLOPIDOGREL BISULFATE 75 MG PO TABS
75.0000 mg | ORAL_TABLET | Freq: Every day | ORAL | Status: DC
  Administered 2018-11-13 – 2018-11-18 (×6): 75 mg via ORAL
  Filled 2018-11-12 (×6): qty 1

## 2018-11-12 MED ORDER — SODIUM CHLORIDE 0.9 % IV BOLUS
1000.0000 mL | Freq: Once | INTRAVENOUS | Status: AC
  Administered 2018-11-12: 1000 mL via INTRAVENOUS

## 2018-11-12 MED ORDER — CARVEDILOL 12.5 MG PO TABS
12.5000 mg | ORAL_TABLET | Freq: Two times a day (BID) | ORAL | Status: DC
  Administered 2018-11-13 – 2018-11-18 (×9): 12.5 mg via ORAL
  Filled 2018-11-12 (×9): qty 1

## 2018-11-12 MED ORDER — DOCUSATE SODIUM 100 MG PO CAPS
100.0000 mg | ORAL_CAPSULE | Freq: Two times a day (BID) | ORAL | Status: DC
  Administered 2018-11-13 – 2018-11-18 (×11): 100 mg via ORAL
  Filled 2018-11-12 (×11): qty 1

## 2018-11-12 MED ORDER — POTASSIUM CHLORIDE CRYS ER 20 MEQ PO TBCR
40.0000 meq | EXTENDED_RELEASE_TABLET | Freq: Once | ORAL | Status: AC
  Administered 2018-11-13: 40 meq via ORAL
  Filled 2018-11-12: qty 2

## 2018-11-12 MED ORDER — MIRTAZAPINE 15 MG PO TABS
7.5000 mg | ORAL_TABLET | Freq: Every day | ORAL | Status: DC
  Administered 2018-11-13 – 2018-11-17 (×6): 7.5 mg via ORAL
  Filled 2018-11-12 (×6): qty 1

## 2018-11-12 MED ORDER — MELATONIN 3 MG PO TABS
3.0000 mg | ORAL_TABLET | Freq: Every day | ORAL | Status: DC
  Administered 2018-11-13 – 2018-11-17 (×6): 3 mg via ORAL
  Filled 2018-11-12 (×7): qty 1

## 2018-11-12 MED ORDER — ASPIRIN EC 325 MG PO TBEC
325.0000 mg | DELAYED_RELEASE_TABLET | Freq: Every day | ORAL | Status: DC
  Administered 2018-11-13 – 2018-11-18 (×5): 325 mg via ORAL
  Filled 2018-11-12 (×5): qty 1

## 2018-11-12 MED ORDER — ENSURE PLUS PO LIQD: 237.0000 mL | Freq: Two times a day (BID) | ORAL | Status: DC

## 2018-11-12 MED ORDER — ATORVASTATIN CALCIUM 40 MG PO TABS
40.0000 mg | ORAL_TABLET | Freq: Every day | ORAL | Status: DC
  Administered 2018-11-13 – 2018-11-17 (×5): 40 mg via ORAL
  Filled 2018-11-12 (×5): qty 1

## 2018-11-12 NOTE — ED Triage Notes (Signed)
Patient presents to the ED by EMS with c/o AMS. He is from [facility], while in PT session the pt had fixed gaze. EMS reports manuel bp 140/90  Upon arrival automatic BP 62/38. Denies recent illness. Takes plavix and has dementia. Pt is resistant to eye opening.

## 2018-11-12 NOTE — ED Notes (Signed)
Pt back from CT scan. He is more talkative and cooperative. Eyes are open. Denies pain.

## 2018-11-12 NOTE — H&P (Signed)
History and Physical:    Maurice Mckee   TJQ:[id number] DOB: [birth year] DOA: 11/12/2018  Referring MD/provider: Dr Tyrone Nine PCP: Patient, No Pcp Per   Patient coming from: Rehab  Chief Complaint: Altered mental status.  He is primarily per ED note.  Patient does not really remember what happened earlier today.  History of Present Illness:   Maurice Mckee is an 85 y.o. male with past medical history significant for advanced dementia, HFrEF, HTN, multiple falls who apparently was in his usual state of health participating in rehab when he was noted to be somewhat altered.  He apparently was just staring off into space and did not seem to be aware of his surroundings.  He eventually came back to normal consciousness but apparently was confused at that time which she had not been prior to this event.  On route patient was noted by EMS to have an episode of hypotension with systolics in the 76A which resolved with IV fluids.  Patient is now being admitted per neurology recommendations for MRI and EEG to rule out possible seizure.  Patient himself has no acute concerns.  He does not really remember what happened earlier today.  He is happy to stay overnight in the hospital.  At this point he does not feel confused.  He knows that he is in the hospital.  Patient denies any fevers or chills now or recently although I am not sure how accurate his recall is.  Denies any abdominal pain chest pain headache at present.  ED Course:  The patient was observed and noted to have normal blood pressure after his initial fluid resuscitation.  Patient's mental status progressively improved to the point where he is now back to normal/baseline.  Head CT is negative.  Patient was seen by neurology who is recommending admission for MRI and EEG overnight.  ROS:   ROS   Review of Systems: Not really very accurate given advanced dementia.  But present symptoms are per HPI  Past Medical History:   Past Medical  History:  Diagnosis Date  . Arthritis   . Cancer ([place])   . Cardiomyopathy ([place])   . CHF (congestive heart failure) ([place])   . COPD (chronic obstructive pulmonary disease) ([place])   . Coronary artery disease   . Dementia ([place])   . Hyperlipidemia   . Hypertension     Past Surgical History:   Past Surgical History:  Procedure Laterality Date  . CARDIAC PACEMAKER PLACEMENT    . INTRAMEDULLARY (IM) NAIL INTERTROCHANTERIC Right 10/12/2018   Procedure: INTRAMEDULLARY (IM) NAIL INTERTROCHANTRIC;  Surgeon: Nicholes Stairs, MD;  Location: [facility];  Service: Orthopedics;  Laterality: Right;    Social History:   Social History   Socioeconomic History  . Marital status: Widowed    Spouse name: Not on file  . Number of children: Not on file  . Years of education: Not on file  . Highest education level: Not on file  Occupational History  . Not on file  Social Needs  . Financial resource strain: Not on file  . Food insecurity    Worry: Not on file    Inability: Not on file  . Transportation needs    Medical: Not on file    Non-medical: Not on file  Tobacco Use  . Smoking status: Never Smoker  . Smokeless tobacco: Never Used  Substance and Sexual Activity  . Alcohol use: Never    Frequency: Never  . Drug use: Never  .  Sexual activity: Not on file  Lifestyle  . Physical activity    Days per week: Not on file    Minutes per session: Not on file  . Stress: Not on file  Relationships  . Social Herbalist on phone: Not on file    Gets together: Not on file    Attends religious service: Not on file    Active member of club or organization: Not on file    Attends meetings of clubs or organizations: Not on file    Relationship status: Not on file  . Intimate partner violence    Fear of current or ex partner: Not on file    Emotionally abused: Not on file    Physically abused: Not on file    Forced sexual activity: Not on file  Other Topics Concern  . Not on file   Social History Narrative  . Not on file    Allergies   Lactose intolerance (gi)  Family history:   Family History  Problem Relation Age of Onset  . Cancer Mother     Current Medications:   Prior to Admission medications   Medication Sig Start Date End Date Taking? Authorizing Provider  acetaminophen (TYLENOL) 325 MG tablet Take 650 mg by mouth every 6 (six) hours as needed (for pain or fever >101 F).    Yes [provider]  aspirin EC 325 MG tablet Take 325 mg by mouth daily.   Yes [provider]  atorvastatin (LIPITOR) 40 MG tablet Take 40 mg by mouth daily at 6 PM.  11/09/16  Yes [provider]  carvedilol (COREG) 12.5 MG tablet Take 12.5 mg by mouth 2 (two) times daily with a meal.  11/09/16  Yes [provider]  cholecalciferol (VITAMIN D) 1000 units tablet Take 1,000 Units by mouth daily.   Yes [provider]  clopidogrel (PLAVIX) 75 MG tablet Take 75 mg by mouth daily.  11/09/16  Yes [provider]  docusate sodium (COLACE) 100 MG capsule Take 1 capsule (100 mg total) by mouth 2 (two) times daily. 10/21/18  Yes Meccariello, Bernita Raisin, DO  Ensure Plus (ENSURE PLUS) LIQD Take 237 mLs by mouth 2 (two) times daily between meals.   Yes [provider]  ferrous sulfate 325 (65 FE) MG tablet Take 325 mg by mouth daily.   Yes [provider]  furosemide (LASIX) 20 MG tablet Take 20 mg by mouth daily.    Yes [provider]  Melatonin 3 MG TABS Take 3 mg by mouth at bedtime.   Yes [provider]  mirtazapine (REMERON) 7.5 MG tablet Take 7.5 mg by mouth at bedtime.   Yes [provider]  Multiple Vitamins-Minerals (ONE-A-DAY PROACTIVE 65+) TABS Take 1 tablet by mouth daily with breakfast.   Yes [provider]  lactose free nutrition (BOOST PLUS) LIQD Take 237 mLs by mouth 2 (two) times daily between meals. Patient not taking: Reported on 11/12/2018 10/21/18   Meccariello, Bernita Raisin,  DO  Multiple Vitamin (MULTIVITAMIN WITH MINERALS) TABS tablet Take 1 tablet by mouth daily. Patient not taking: Reported on 11/12/2018 10/22/18   Cleophas Dunker, DO    Physical Exam:   Vitals:   11/12/18 1415 11/12/18 1430 11/12/18 1445 11/12/18 1515  BP: 126/85 (!) 142/88 (!) 148/91 124/74  Pulse: 64 69 69 62  Resp: 14 13 11 12   Temp:      TempSrc:      SpO2: 100%  100% 100% 100%     Physical Exam: Blood pressure 124/74, pulse 62, temperature 97.8 F (36.6 C), temperature source Rectal, resp. rate 12, SpO2 100 %. Gen: Pleasant friendly man sitting up in stretcher at 40 degrees in no acute distress at all.  Breathing comfortably.  Able to speak in full sentences. Eyes: Sclerae anicteric. Conjunctiva mildly injected. Chest: Moderately good air entry bilaterally with no adventitious sounds.  CV: Distant, regular. Abdomen: Aphthoid, NABS, soft, nondistended, nontender. No tenderness to light or deep palpation. No rebound, no guarding. Extremities: No edema.  Neuro: Appears to have a normal level of consciousness, he is pleasant friendly communicative and coherent to the questions I have asked him.  Knows who he is but is not entirely clear about the date or which hospital he is in.  Normal speech, normal comprehension.  Moving all extremities without difficulty.  Grossly nonfocal.   Data Review:    Labs: Basic Metabolic Panel: Recent Labs  Lab 11/12/18 1350 11/12/18 1438  NA 139 139  K 3.4* 3.6  CL 105  --   CO2 27  --   GLUCOSE 86  --   BUN 13  --   CREATININE 1.03  --   CALCIUM 8.7*  --    Liver Function Tests: Recent Labs  Lab 11/12/18 1350  AST 21  ALT 21  ALKPHOS 123  BILITOT 0.4  PROT 5.1*  ALBUMIN 2.6*   No results for input(s): LIPASE, AMYLASE in the last 168 hours. Recent Labs  Lab 11/12/18 1350  AMMONIA 18   CBC: Recent Labs  Lab 11/12/18 1350 11/12/18 1438  WBC 12.5*  --   NEUTROABS 7.5  --   HGB 10.9* 13.9  HCT 35.6* 41.0  MCV  101.1*  --   PLT 233  --    Cardiac Enzymes: No results for input(s): CKTOTAL, CKMB, CKMBINDEX, TROPONINI in the last 168 hours.  BNP (last 3 results) No results for input(s): PROBNP in the last 8760 hours. CBG: Recent Labs  Lab 11/12/18 1405  GLUCAP 82    Urinalysis    Component Value Date/Time   COLORURINE YELLOW 11/12/2018 1501   APPEARANCEUR CLEAR 11/12/2018 1501   LABSPEC 1.008 11/12/2018 1501   PHURINE 6.0 11/12/2018 1501   GLUCOSEU NEGATIVE 11/12/2018 1501   HGBUR NEGATIVE 11/12/2018 1501   BILIRUBINUR NEGATIVE 11/12/2018 1501   KETONESUR NEGATIVE 11/12/2018 1501   PROTEINUR NEGATIVE 11/12/2018 1501   NITRITE NEGATIVE 11/12/2018 1501   LEUKOCYTESUR NEGATIVE 11/12/2018 1501      Radiographic Studies: Ct Head Wo Contrast  Result Date: 11/12/2018 CLINICAL DATA:  Dementia altered mental status EXAM: CT HEAD WITHOUT CONTRAST TECHNIQUE: Contiguous axial images were obtained from the base of the skull through the vertex without intravenous contrast. COMPARISON:  CT brain 10/10/2018 FINDINGS: Brain: No acute territorial infarction, hemorrhage or intracranial mass. Atrophy and small vessel ischemic changes of the white matter. Chronic infarcts within the left basal ganglia and left frontal white matter with ex vacuo dilatation of the anterior horn of the ventricle on the left. Ventricles are stable in size and morphology. Vascular: No hyperdense vessels.  Carotid vascular calcification Skull: Normal. Negative for fracture or focal lesion. Sinuses/Orbits: Completely opacified left maxillary sinus with curvilinear calcifications as before. Patchy mucosal thickening in the ethmoid sinuses Other: None IMPRESSION: 1. No CT evidence for acute intracranial abnormality. 2. Atrophy and small vessel ischemic changes of the white matter. Electronically Signed   By: Donavan Foil M.D.   On: 11/12/2018  14:47   Dg Chest Port 1 View  Result Date: 11/12/2018 CLINICAL DATA:  Altered mental  status. EXAM: PORTABLE CHEST 1 VIEW COMPARISON:  None. FINDINGS: The heart size and mediastinal contours are within normal limits. Left-sided pacemaker is unchanged in position. Atherosclerosis of thoracic aorta is noted. No pneumothorax or pleural effusion is noted. Both lungs are clear. The visualized skeletal structures are unremarkable. IMPRESSION: No acute cardiopulmonary abnormality seen. Aortic Atherosclerosis (ICD10-I70.0). Electronically Signed   By: Marijo Conception M.D.   On: 11/12/2018 13:52    EKG: Ordered and pending  Assessment/Plan:   Principal Problem:   Altered mental status, unspecified Active Problems:   Chronic systolic congestive heart failure (HCC)   Dementia without behavioral disturbance (HCC)   Essential hypertension   Pulmonary emphysema (HCC)   BPH (benign prostatic hyperplasia)   85 year old gentleman advanced dementia was noted to have temporary alteration in consciousness earlier today now entirely resolved.  He also had an brief episode of hypotension which is responded well to IV fluids and he is now been normotensive since he has been here.  Patient is now being admitted for work-up of possible seizure per neurology recommendations.   ALTERED MENTAL STATUS, NOW RESOLVED Sounds like patient likely had an absence seizure. EEG and MRI requested. Patient's mental status is now back to baseline  TRANSIENT HYPOTENSION Has responded well to IV fluids.  I will continue very gentle fluid resuscitation overnight.  CAD Continue atorvastatin aspirin Plavix and carvedilol Hold furosemide overnight  DEPRESSION Continue mirtazapine  DEMENTIA Patient's memantine and donepezil were recently continued on last hospital stay on 10/21/2018         Other information:   DVT prophylaxis: Lovenox ordered. Code Status: Full code. Family Communication: Book with patient son Carlis Abbott Disposition Plan: TBD Consults called: Neurology curbside per ED staff Admission  status: Observation   [facility]  If 7PM-7AM, please contact night-coverage [email or web address] Password [facility] 11/12/2018, 5:01 PM

## 2018-11-12 NOTE — ED Notes (Signed)
Navigator RN: Spoke with his son Carlis Abbott and gave him an update on current status. Facetimed with his son.

## 2018-11-12 NOTE — ED Notes (Signed)
Nurse Navigator communication: a Advertising account executive was left for [employer], the pt is being admitted. Contacts notified.

## 2018-11-12 NOTE — Progress Notes (Signed)
New Admission Note: Patient admitted from the [facility] ED  Arrival Method: via stretcher Mental Orientation: alert and oriented to self Telemetry: initiated per order Assessment: Completed Skin: Stage II L buttocks IV: L AC Pain: Denies  Tubes: to be completed Safety Measures: Safety Fall Prevention Plan has been given, discussed and signed, initiated low bed  Admission: to be Completed [facility] Orientation: Patient has been orientated to the room, unit and staff.  Family: Made aware of patient rransfer  Orders have been reviewed and implemented. Will continue to monitor the patient. Call light has been placed within reach and bed alarm has been activated.   Dorthey Sawyer, RN

## 2018-11-12 NOTE — ED Notes (Signed)
Lab results was given to Nurse Bandi.

## 2018-11-12 NOTE — ED Provider Notes (Signed)
[facility] EMERGENCY DEPARTMENT Provider Note   CSN: [id number] Arrival date & time: 11/12/18  1302    History   Chief Complaint Chief Complaint  Patient presents with  . Altered Mental Status    HPI Maurice Mckee is a 85 y.o. male.     85 yo M with a chief complaint of altered mental status.  Patient was at physical therapy today for rehab from his recent hip fracture.  While there he was going to rehab in the noted that he stared into space.  He eventually was able to talk with them again but seems somewhat confused about what was going on.  In route with EMS he had a steady lowering of his blood pressure went down into the 30Q systolic.  Improved somewhat with IV fluids.  On arrival here the patient's blood pressure is 657 systolic, he is able to mumble.  Level 5 caveat altered mental status.  The history is provided by the patient.  Altered Mental Status Presenting symptoms: no confusion   Associated symptoms: no abdominal pain, no fever, no headaches, no palpitations, no rash and no vomiting   Illness Severity:  Mild Onset quality:  Gradual Duration:  1 hour Timing:  Constant Progression:  Partially resolved Chronicity:  New Associated symptoms: no abdominal pain, no chest pain, no congestion, no diarrhea, no fever, no headaches, no myalgias, no rash, no shortness of breath and no vomiting     Past Medical History:  Diagnosis Date  . Arthritis   . Cancer ([place])   . Cardiomyopathy ([place])   . CHF (congestive heart failure) ([place])   . COPD (chronic obstructive pulmonary disease) ([place])   . Coronary artery disease   . Dementia ([place])   . Hyperlipidemia   . Hypertension     Patient Active Problem List   Diagnosis Date Noted  . Hypoxia   . Protein-calorie malnutrition, severe 10/16/2018  . Pneumothorax   . Pressure injury of skin 10/11/2018  . Palliative care by specialist   . Hip fracture ([place]) 10/10/2018  . Multiple falls 03/18/2018  . Fracture  of inferior pubic ramus ([place]) 03/18/2018  . Delirium 03/18/2018  . BPH (benign prostatic hyperplasia) 03/18/2018  . 'light-for-dates' infant with signs of fetal malnutrition 09/09/2017  . Urinary urgency 06/21/2017  . Spinal stenosis of lumbar region 05/26/2017  . Fracture of two ribs of right side, closed, initial encounter 01/25/2017  . Anemia, normocytic normochromic 11/09/2016  . Non-seasonal allergic rhinitis 02/15/2016  . Risk for falls 11/16/2015  . Vision problem 11/16/2015  . Vitamin D deficiency 07/29/2015  . Urinary incontinence 07/08/2015  . Pulmonary emphysema ([place]) 11/26/2014  . 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 Primary osteoarthritis of right hip 08/30/2014  . Hyperlipidemia 05/10/2014  . Right hip pain 12/24/2013  . Enlarged prostate without lower urinary tract symptoms (luts) 12/24/2013  . Hyperglycemia 12/24/2013  . Numbness 12/24/2013  . Chronic systolic congestive heart failure ([place]) 06/11/2013  . Congestive heart failure ([place]) 06/11/2013  . Dementia without behavioral disturbance ([place]) 06/11/2013  . Essential hypertension 06/11/2013  . Primary cardiomyopathy ([place]) 06/11/2013  . Abnormal glucose 03/02/2013  . Diarrhea 03/02/2013  . Insomnia 03/02/2013  . Abnormality of gait 10/25/2012  . Goals of care, counseling/discussion 10/25/2012  . Generalized osteoarthritis of multiple sites 10/25/2012  . Skin sensation disturbance 10/25/2012  . Backache 08/20/2011  . Other allergy, other than to medicinal agents 01/19/2010    Past Surgical History:  Procedure Laterality Date  .  CARDIAC PACEMAKER PLACEMENT    . INTRAMEDULLARY (IM) NAIL INTERTROCHANTERIC Right 10/12/2018   Procedure: INTRAMEDULLARY (IM) NAIL INTERTROCHANTRIC;  Surgeon: Nicholes Stairs, MD;  Location: [facility];  Service: Orthopedics;  Laterality: Right;        Home Medications    Prior to Admission medications   Medication Sig Start Date End Date Taking?  Authorizing Provider  acetaminophen (TYLENOL) 325 MG tablet Take 650 mg by mouth every 6 (six) hours as needed (pain, fever >101).    [provider]  aspirin EC 325 MG tablet Take 325 mg by mouth daily.    [provider]  atorvastatin (LIPITOR) 40 MG tablet Take 40 mg by mouth daily.  11/09/16   [provider]  carvedilol (COREG) 12.5 MG tablet Take 12.5 mg by mouth 2 (two) times daily with a meal.  11/09/16   [provider]  cholecalciferol (VITAMIN D) 1000 units tablet Take 1,000 Units by mouth daily.    [provider]  clopidogrel (PLAVIX) 75 MG tablet Take 75 mg by mouth daily.  11/09/16   [provider]  docusate sodium (COLACE) 100 MG capsule Take 1 capsule (100 mg total) by mouth 2 (two) times daily. 10/21/18   Meccariello, Bernita Raisin, DO  ferrous sulfate 325 (65 FE) MG tablet Take 325 mg by mouth daily.    [provider]  furosemide (LASIX) 20 MG tablet Take 20 mg by mouth daily.     [provider]  lactose free nutrition (BOOST PLUS) LIQD Take 237 mLs by mouth 2 (two) times daily between meals. 10/21/18   Meccariello, Bernita Raisin, DO  Multiple Vitamin (MULTIVITAMIN WITH MINERALS) TABS tablet Take 1 tablet by mouth daily. 10/22/18   Meccariello, Bernita Raisin, DO    Family History Family History  Problem Relation Age of Onset  . Cancer Mother     Social History Social History   Tobacco Use  . Smoking status: Never Smoker  . Smokeless tobacco: Never Used  Substance Use Topics  . Alcohol use: Never    Frequency: Never  . Drug use: Never     Allergies   Lactose intolerance (gi)   Review of Systems Review of Systems  Unable to perform ROS: Mental status change  Constitutional: Negative for chills and fever.  HENT: Negative for congestion and facial swelling.   Eyes: Negative for discharge and visual disturbance.  Respiratory: Negative for shortness of breath.   Cardiovascular: Negative for chest pain and  palpitations.  Gastrointestinal: Negative for abdominal pain, diarrhea and vomiting.  Musculoskeletal: Negative for arthralgias and myalgias.  Skin: Negative for color change and rash.  Neurological: Negative for tremors, syncope and headaches.  Psychiatric/Behavioral: Negative for confusion and dysphoric mood.     Physical Exam Updated Vital Signs BP (!) 148/91   Pulse 69   Temp 97.8 F (36.6 C) (Rectal)   Resp 11   SpO2 100%   Physical Exam Vitals signs and nursing note reviewed.  Constitutional:      Appearance: He is well-developed.     Comments: Cachectic, chronically ill-appearing  HENT:     Head: Normocephalic and atraumatic.  Eyes:     Pupils: Pupils are equal, round, and reactive to light.     Comments: Difficult eye exam as patient looks away from the light  Neck:     Musculoskeletal: Normal range of motion and neck supple.     Vascular: No JVD.  Cardiovascular:     Rate and Rhythm: Normal  rate and regular rhythm.     Heart sounds: No murmur. No friction rub. No gallop.   Pulmonary:     Effort: No respiratory distress.     Breath sounds: No wheezing.  Abdominal:     General: There is no distension.     Tenderness: There is no guarding or rebound.  Musculoskeletal: Normal range of motion.  Skin:    Coloration: Skin is not pale.     Findings: No rash.  Neurological:     Mental Status: He is alert.      ED Treatments / Results  Labs (all labs ordered are listed, but only abnormal results are displayed) Labs Reviewed  COMPREHENSIVE METABOLIC PANEL - Abnormal; Notable for the following components:      Result Value   Potassium 3.4 (*)    Calcium 8.7 (*)    Total Protein 5.1 (*)    Albumin 2.6 (*)    All other components within normal limits  CBC WITH DIFFERENTIAL/PLATELET - Abnormal; Notable for the following components:   WBC 12.5 (*)    RBC 3.52 (*)    Hemoglobin 10.9 (*)    HCT 35.6 (*)    MCV 101.1 (*)    Monocytes Absolute 1.1 (*)    All  other components within normal limits  POCT I-STAT EG7 - Abnormal; Notable for the following components:   pO2, Ven 15.0 (*)    Bicarbonate 30.5 (*)    Acid-Base Excess 3.0 (*)    All other components within normal limits  SARS CORONAVIRUS 2 (HOSPITAL ORDER, [place] [facility])  URINE CULTURE  AMMONIA  ETHANOL  URINALYSIS, ROUTINE W REFLEX MICROSCOPIC  LACTIC ACID, PLASMA  LACTIC ACID, PLASMA  RAPID URINE DRUG SCREEN, HOSP PERFORMED  CBG MONITORING, ED  I-STAT VENOUS BLOOD GAS, ED    EKG None  Radiology Ct Head Wo Contrast  Result Date: 11/12/2018 CLINICAL DATA:  Dementia altered mental status EXAM: CT HEAD WITHOUT CONTRAST TECHNIQUE: Contiguous axial images were obtained from the base of the skull through the vertex without intravenous contrast. COMPARISON:  CT brain 10/10/2018 FINDINGS: Brain: No acute territorial infarction, hemorrhage or intracranial mass. Atrophy and small vessel ischemic changes of the white matter. Chronic infarcts within the left basal ganglia and left frontal white matter with ex vacuo dilatation of the anterior horn of the ventricle on the left. Ventricles are stable in size and morphology. Vascular: No hyperdense vessels.  Carotid vascular calcification Skull: Normal. Negative for fracture or focal lesion. Sinuses/Orbits: Completely opacified left maxillary sinus with curvilinear calcifications as before. Patchy mucosal thickening in the ethmoid sinuses Other: None IMPRESSION: 1. No CT evidence for acute intracranial abnormality. 2. Atrophy and small vessel ischemic changes of the white matter. Electronically Signed   By: Donavan Foil M.D.   On: 11/12/2018 14:47   Dg Chest Port 1 View  Result Date: 11/12/2018 CLINICAL DATA:  Altered mental status. EXAM: PORTABLE CHEST 1 VIEW COMPARISON:  None. FINDINGS: The heart size and mediastinal contours are within normal limits. Left-sided pacemaker is unchanged in position. Atherosclerosis of thoracic  aorta is noted. No pneumothorax or pleural effusion is noted. Both lungs are clear. The visualized skeletal structures are unremarkable. IMPRESSION: No acute cardiopulmonary abnormality seen. Aortic Atherosclerosis (ICD10-I70.0). Electronically Signed   By: Marijo Conception M.D.   On: 11/12/2018 13:52    Procedures Procedures (including critical care time)  Medications Ordered in ED Medications  sodium chloride 0.9 % bolus 1,000 mL (0  mLs Intravenous Stopped 11/12/18 1459)     Initial Impression / Assessment and Plan / ED Course  I have reviewed the triage vital signs and the nursing notes.  Pertinent labs & imaging results that were available during my care of the patient were reviewed by me and considered in my medical decision making (see chart for details).        85 yo M with a chief complaint of altered mental status.  This happened while he was at rehab.  Had a transient dip in his blood pressure as well.  On arrival his blood pressure is improved but he still does not alert or communicating appropriately.  Will obtain an altered mental status evaluation.  Bolus of IV fluids.  Reassess.    I discussed the case with Dr. Lorraine Lax, neurology he felt that this could be a seizure and if the patient was coming to the hospital recommended an MRI as well as an EEG.  As the patient is chronically debilitated I am unsure of why he had this profound episode of hypotension will discuss with medicine for admission.  CRITICAL CARE Performed by: Cecilio Asper   Total critical care time: 35 minutes  Critical care time was exclusive of separately billable procedures and treating other patients.  Critical care was necessary to treat or prevent imminent or life-threatening deterioration.  Critical care was time spent personally by me on the following activities: development of treatment plan with patient and/or surrogate as well as nursing, discussions with consultants, evaluation of  patient's response to treatment, examination of patient, obtaining history from patient or surrogate, ordering and performing treatments and interventions, ordering and review of laboratory studies, ordering and review of radiographic studies, pulse oximetry and re-evaluation of patient's condition.  The patients results and plan were reviewed and discussed.   Any x-rays performed were independently reviewed by myself.   Differential diagnosis were considered with the presenting HPI.  Medications  sodium chloride 0.9 % bolus 1,000 mL (0 mLs Intravenous Stopped 11/12/18 1459)    Vitals:   11/12/18 1400 11/12/18 1415 11/12/18 1430 11/12/18 1445  BP: 128/88 126/85 (!) 142/88 (!) 148/91  Pulse: 64 64 69 69  Resp: 16 14 13 11   Temp:      TempSrc:      SpO2: 100% 100% 100% 100%    Final diagnoses:  Hypotension, unspecified hypotension type  Near syncope    Admission/ observation were discussed with the admitting physician, patient and/or family and they are comfortable with the plan.   Final Clinical Impressions(s) / ED Diagnoses   Final diagnoses:  Hypotension, unspecified hypotension type  Near syncope    ED Discharge Orders    None       Deno Etienne, DO 11/12/18 1548

## 2018-11-13 ENCOUNTER — Encounter (HOSPITAL_COMMUNITY): Payer: Self-pay | Admitting: *Deleted

## 2018-11-13 ENCOUNTER — Observation Stay (HOSPITAL_COMMUNITY)
Admit: 2018-11-13 | Discharge: 2018-11-13 | Disposition: A | Discharge status: Home / Self Care | Payer: Medicare Other | Attending: Internal Medicine | Admitting: Internal Medicine

## 2018-11-13 ENCOUNTER — Other Ambulatory Visit: Payer: Self-pay

## 2018-11-13 DIAGNOSIS — R569 Unspecified convulsions: Secondary | ICD-10-CM | POA: Diagnosis not present

## 2018-11-13 DIAGNOSIS — R451 Restlessness and agitation: Secondary | ICD-10-CM

## 2018-11-13 DIAGNOSIS — F0391 Unspecified dementia with behavioral disturbance: Secondary | ICD-10-CM

## 2018-11-13 DIAGNOSIS — J439 Emphysema, unspecified: Secondary | ICD-10-CM

## 2018-11-13 DIAGNOSIS — I5022 Chronic systolic (congestive) heart failure: Secondary | ICD-10-CM | POA: Diagnosis not present

## 2018-11-13 DIAGNOSIS — I10 Essential (primary) hypertension: Secondary | ICD-10-CM

## 2018-11-13 DIAGNOSIS — Z515 Encounter for palliative care: Secondary | ICD-10-CM | POA: Diagnosis not present

## 2018-11-13 DIAGNOSIS — Z7189 Other specified counseling: Secondary | ICD-10-CM

## 2018-11-13 DIAGNOSIS — G301 Alzheimer's disease with late onset: Secondary | ICD-10-CM | POA: Diagnosis not present

## 2018-11-13 DIAGNOSIS — R404 Transient alteration of awareness: Secondary | ICD-10-CM

## 2018-11-13 LAB — BASIC METABOLIC PANEL
Anion gap: 7 (ref 5–15)
BUN: 12 mg/dL (ref 8–23)
CO2: 26 mmol/L (ref 22–32)
Calcium: 8.6 mg/dL — ABNORMAL LOW (ref 8.9–10.3)
Chloride: 108 mmol/L (ref 98–111)
Creatinine, Ser: 0.77 mg/dL (ref 0.61–1.24)
GFR calc Af Amer: >60 mL/min (ref >60)
GFR calc non Af Amer: >60 mL/min (ref >60)
Glucose, Bld: 96 mg/dL (ref 70–99)
Potassium: 3.7 mmol/L (ref 3.5–5.1)
Sodium: 141 mmol/L (ref 135–145)

## 2018-11-13 LAB — CBC
HCT: 34.9 % — ABNORMAL LOW (ref 39.0–52.0)
Hemoglobin: 10.9 g/dL — ABNORMAL LOW (ref 13.0–17.0)
MCH: 30.9 pg (ref 26.0–34.0)
MCHC: 31.2 g/dL (ref 30.0–36.0)
MCV: 98.9 fL (ref 80.0–100.0)
Platelets: 224 10*3/uL (ref 150–400)
RBC: 3.53 MIL/uL — ABNORMAL LOW (ref 4.22–5.81)
RDW: 15.1 % (ref 11.5–15.5)
WBC: 11.2 10*3/uL — ABNORMAL HIGH (ref 4.0–10.5)
nRBC: 0 % (ref 0.0–0.2)

## 2018-11-13 LAB — MRSA PCR SCREENING: MRSA by PCR: POSITIVE — AB

## 2018-11-13 LAB — URINE CULTURE: Culture: <10000 — AB

## 2018-11-13 LAB — MAGNESIUM: Magnesium: 2 mg/dL (ref 1.7–2.4)

## 2018-11-13 LAB — GLUCOSE, CAPILLARY: Glucose-Capillary: 75 mg/dL (ref 70–99)

## 2018-11-13 MED ORDER — MUPIROCIN 2 % EX OINT
1.0000 "application " | TOPICAL_OINTMENT | Freq: Two times a day (BID) | CUTANEOUS | Status: AC
  Administered 2018-11-13 – 2018-11-17 (×8): 1 via NASAL
  Filled 2018-11-13 (×4): qty 22

## 2018-11-13 MED ORDER — ENSURE ENLIVE PO LIQD
237.0000 mL | Freq: Three times a day (TID) | ORAL | Status: DC
  Administered 2018-11-13 – 2018-11-17 (×7): 237 mL via ORAL

## 2018-11-13 MED ORDER — ADULT MULTIVITAMIN W/MINERALS CH
1.0000 | ORAL_TABLET | Freq: Every day | ORAL | Status: DC
  Administered 2018-11-13 – 2018-11-18 (×6): 1 via ORAL
  Filled 2018-11-13 (×6): qty 1

## 2018-11-13 MED ORDER — POTASSIUM CHLORIDE CRYS ER 20 MEQ PO TBCR
40.0000 meq | EXTENDED_RELEASE_TABLET | Freq: Once | ORAL | Status: DC
  Filled 2018-11-13: qty 2

## 2018-11-13 NOTE — Progress Notes (Signed)
Family Medicine Teaching Service Daily Progress Note Intern Pager: [phone]  Patient name: Maurice Mckee Mckee Medical record number: [id number] Date of birth: April 21, 1926 Age: 85 y.o. Gender: male  Primary Care Provider: Patient, No Pcp Per Consultants: Neuro Code Status: Full  Pt Overview and Major Events to Date:  6/24- admission for AMS  Assessment and Plan: Maurice Mckee Mckee is a 85 y.o. male presenting from SNF with AMS concerning for seizure. PMH is significant for multiple falls s/p left and right hip fracture on 4/16 and 5/24, recent history of pneumonia in April, advanced dementia, HFrEF (45-50%), HTN, HLD, and vitamin D deficiency.  Altered Mental Status, resolved  H/o Advanced Dementia: Presenting after experiencing a temporary alteration in consciousness while undergoing physical therapy at his SNF which self resolved shortly after. He was noted to also have simultaneous transient hypotension that resolved with IV fluids. Neurology curb-sided in ED and felt symptoms most consistent with seizure and recommended MRI and EEG. Has remained at baseline since admission with no further episodes of AMS. Has remained hemodynamically stable and afebrile overnight. On assessment this morning, he was arousable to vigorous stimulation of the arm or leg and had incredibly garbled speech.  He would not answer in any obviously coherent manner questioning.  Ultimately, was confirmed by the son this appeared to be his baseline mental status.  No need for acute stroke work-up at this time.  Neurology has been consulted for further work-up. - follow up MRI - follow up EEG - recently discontinued Memantine and donepezil at discharge on 6/2 given age  Dementia, end stage Per conversation with son, Maurice Mckee Mckee is demonstrated consistent decline in mental status in the past months.  Given his current mental status and comorbidities, he would not likely show significant improvement for this hospitalization with any  additional ongoing medical care.  At this time, he remains full code.  -Palliative consulted for goals of care discussion with family  Transient Hypotension  H/o HTN: resolved: SBP noted to be in 60's by EMS that resolved with IV fluids. BP overnight normotensive with gentle mIVF.  - continue to monitor  HFrEF (45-50%)  Pacemaker: Home meds: Coreg 12.5mg  BID, ASA, Plavix. Appears euvolemc on exam - continue home meds  CAD:  Home meds: Atorvastatin 40mg , ASA 81mg , Plavix 75mg  - continue home meds  Depression: Home meds: Mirtazapine - continue home meds  FEN/GI: Heart Healthy/carb modified PPx: Lovenox  Disposition: pending neuro recs and GOC discussion  Subjective:  He was found to be at his baseline mental status this morning.  He is very difficult to understand due to his significant dysarthria though he did respond to vigorous shoulder stimulation.  Objective: Temp:  [97.5 F (36.4 C)-98.3 F (36.8 C)] 97.5 F (36.4 C) (06/25 0300) Pulse Rate:  [57-79] 77 (06/25 0300) Resp:  [11-16] 12 (06/25 0300) BP: (112-148)/(63-91) 131/63 (06/25 0300) SpO2:  [62 %-100 %] 98 % (06/25 0300) Weight:  [60 kg] 60 kg (06/25 0300)  Physical Exam: General: Sleeping on arrival to the room.  Difficult to arouse but arousable with vigorous stimulation of the shoulder leg.  Severe dysarthria.  Very difficult to understand.  Noncompliant with physical exam.  Would not follow commands. Cardio: Normal S1 and S2, no S3 or S4. Rhythm is regular. No murmurs or rubs.   Pulm: Clear to auscultation bilaterally, no crackles, wheezing, or diminished breath sounds. Normal respiratory effort Abdomen: Bowel sounds normal. Abdomen soft and non-tender.  Extremities: No peripheral edema. Warm/ well perfused.  Strong  radial pulse. Neuro: Cranial nerves grossly intact   Laboratory: Recent Labs  Lab 11/12/18 1350 11/12/18 1438 11/13/18 0633  WBC 12.5*  --  11.2*  HGB 10.9* 13.9 10.9*  HCT 35.6*  41.0 34.9*  PLT 233  --  224   Recent Labs  Lab 11/12/18 1350 11/12/18 1438 11/13/18 0633  NA 139 139 141  K 3.4* 3.6 3.7  CL 105  --  108  CO2 27  --  26  BUN 13  --  12  CREATININE 1.03  --  0.77  CALCIUM 8.7*  --  8.6*  PROT 5.1*  --   --   BILITOT 0.4  --   --   ALKPHOS 123  --   --   ALT 21  --   --   AST 21  --   --   GLUCOSE 86  --  96   Imaging/Diagnostic Tests:  Ct Head Wo Contrast  Result Date: 11/12/2018 CLINICAL DATA:  Dementia altered mental status EXAM: CT HEAD WITHOUT CONTRAST TECHNIQUE: Contiguous axial images were obtained from the base of the skull through the vertex without intravenous contrast. COMPARISON:  CT brain 10/10/2018 FINDINGS: Brain: No acute territorial infarction, hemorrhage or intracranial mass. Atrophy and small vessel ischemic changes of the white matter. Chronic infarcts within the left basal ganglia and left frontal white matter with ex vacuo dilatation of the anterior horn of the ventricle on the left. Ventricles are stable in size and morphology. Vascular: No hyperdense vessels.  Carotid vascular calcification Skull: Normal. Negative for fracture or focal lesion. Sinuses/Orbits: Completely opacified left maxillary sinus with curvilinear calcifications as before. Patchy mucosal thickening in the ethmoid sinuses Other: None IMPRESSION: 1. No CT evidence for acute intracranial abnormality. 2. Atrophy and small vessel ischemic changes of the white matter. Electronically Signed   By: Donavan Foil M.D.   On: 11/12/2018 14:47   Dg Chest Port 1 View  Result Date: 11/12/2018 CLINICAL DATA:  Altered mental status. EXAM: PORTABLE CHEST 1 VIEW COMPARISON:  None. FINDINGS: The heart size and mediastinal contours are within normal limits. Left-sided pacemaker is unchanged in position. Atherosclerosis of thoracic aorta is noted. No pneumothorax or pleural effusion is noted. Both lungs are clear. The visualized skeletal structures are unremarkable. IMPRESSION: No  acute cardiopulmonary abnormality seen. Aortic Atherosclerosis (ICD10-I70.0). Electronically Signed   By: Marijo Conception M.D.   On: 11/12/2018 13:52    Matilde Haymaker, MD 11/13/2018, 8:51 AM PGY-1, [place] Intern pager: [phone], text pages welcome

## 2018-11-13 NOTE — Care Management Obs Status (Signed)
[facility] NOTIFICATION   Patient Details  Name: Maurice Mckee MRN: [id number] Date of Birth: July 20, 1925   Medicare Observation Status Notification Given:  Yes  CM attempted to reach the patients son Coleston Dirosa at [phone] (d/t the patients dementia),  via phone to discuss the [facility] notice with no answer. CM signed the form and will mail to the address listed in Epic.   Georgeanna Lea, RN 11/13/2018, 5:02 PM

## 2018-11-13 NOTE — Progress Notes (Signed)
Called patient son Jaymie Misch to give update on patient's status.

## 2018-11-13 NOTE — Consult Note (Addendum)
Consultation Note Date: 11/13/2018   Patient Name: Maurice Mckee  DOB: 08/06/1925  MRN: [id number]  Age / Sex: 85 y.o., male  PCP: Patient, No Pcp Per Referring Physician: Axel Mckee, *  Reason for Consultation: Establishing goals of care and Psychosocial/spiritual support  HPI/Patient Profile: 85 y.o. male  admitted on 11/12/2018 with  past medical history significant for advanced dementia, HFrEF,  HTN, multiple falls, status post left hip fracture in April 2020 who apparently was in his usual state of health participating in rehab when he was noted to be somewhat altered.    He apparently was just staring off into space and did not seem to be aware of his surroundings.  He eventually came back to normal consciousness but apparently was confused at that time which she had not been prior to this event.    CT scan of the brain/no evidence for acute intracranial abnormality.  Currently patient is agitated, unable to follow commands.  Family face treatment option decisions, advanced directive decisions and anticipatory care needs.  Clinical Assessment and Goals of Care:   This NP Maurice Mckee reviewed medical records, received report from team, assessed the patient and then meet at the patient's bedside and spoke by phone with his son/  Maurice Mckee to discuss diagnosis, prognosis, [place], EOL wishes disposition and options.  Concept of Hospice and Palliative Care were discussed   palliative medicine team had a discussion with Maurice Mckee/the patient's son in May 2020  A  discussion was had today regarding advanced directives.  Concepts specific to code status, artifical feeding and hydration, continued IV antibiotics and rehospitalization was had.  The difference between a aggressive medical intervention path  and a palliative comfort care path for this patient at this time was had.  Values and goals of care  important to patient and family were attempted to be elicited.  We discussed the patient's multiple comorbidities within the context of failure to thrive and human mortality.  Maurice Mckee verbalizes an understanding of his fathers medical situation and his  limited prognosis.  He does speak to his fathers pain and suffering.  He tells me he has 5 other brothers and sisters and that the 13 of family support continued life prolonging measures until "God's will be done"    He will support the majority of family's decision.  Questions and concerns addressed.   Family encouraged to call with questions or concerns.       Maurice Mckee tells me he has documentation of power of attorney.  No documentation reflected in chart  SUMMARY OF RECOMMENDATIONS    Code Status/Advance Care Planning:  Full code- encouraged consideration of DNR/DNI knowing poor outcomes in similar patients   Family is open to all offered and available medical interventions to prolong life   Palliative Prophylaxis:   Aspiration, Bowel Regimen, Delirium Protocol, Frequent Pain Assessment and Oral Care  Additional Recommendations (Limitations, Scope, Preferences):  Full Scope Treatment    Psycho-social/Spiritual:   Desire for further Chaplaincy support:no  Additional Recommendations: Education  on Hospice and Grief/Bereavement Support  Prognosis:   < 6 months  Discharge Planning: To Be Determined      Primary Diagnoses: Present on Admission:  Altered mental status, unspecified  BPH (benign prostatic hyperplasia)  Chronic systolic congestive heart failure (HCC)  Dementia without behavioral disturbance (HCC)  Essential hypertension  Pulmonary emphysema ([place])   I have reviewed the medical record, interviewed the patient and family, and examined the patient. The following aspects are pertinent.  Past Medical History:  Diagnosis Date   Arthritis    Cancer ([place])    Cardiomyopathy ([place])    CHF  (congestive heart failure) ([place])    COPD (chronic obstructive pulmonary disease) (HCC)    Coronary artery disease    Dementia (HCC)    Hyperlipidemia    Hypertension    Social History   Socioeconomic History   Marital status: Widowed    Spouse name: Not on file   Number of children: Not on file   Years of education: Not on file   Highest education level: Not on file  Occupational History   Not on file  Social Needs   Financial resource strain: Not on file   Food insecurity    Worry: Not on file    Inability: Not on file   Transportation needs    Medical: Not on file    Non-medical: Not on file  Tobacco Use   Smoking status: Never Smoker   Smokeless tobacco: Never Used  Substance and Sexual Activity   Alcohol use: Never    Frequency: Never   Drug use: Never   Sexual activity: Not on file  Lifestyle   Physical activity    Days per week: Not on file    Minutes per session: Not on file   Stress: Not on file  Relationships   Social connections    Talks on phone: Not on file    Gets together: Not on file    Attends religious service: Not on file    Active member of club or organization: Not on file    Attends meetings of clubs or organizations: Not on file    Relationship status: Not on file  Other Topics Concern   Not on file  Social History Narrative   Not on file   Family History  Problem Relation Age of Onset   Cancer Mother    Scheduled Meds:  aspirin EC  325 mg Oral Daily   atorvastatin  40 mg Oral q1800   carvedilol  12.5 mg Oral BID WC   clopidogrel  75 mg Oral Daily   docusate sodium  100 mg Oral BID   enoxaparin (LOVENOX) injection  40 mg Subcutaneous Q24H   feeding supplement (ENSURE ENLIVE)  237 mL Oral BID BM   Melatonin  3 mg Oral QHS   mirtazapine  7.5 mg Oral QHS   mupirocin ointment  1 application Nasal BID   potassium chloride  40 mEq Oral Once   Continuous Infusions:  sodium chloride 75 mL/hr at  11/12/18 1930   PRN Meds:.acetaminophen Medications Prior to Admission:  Prior to Admission medications   Medication Sig Start Date End Date Taking? Authorizing Provider  acetaminophen (TYLENOL) 325 MG tablet Take 650 mg by mouth every 6 (six) hours as needed (for pain or fever >101 F).    Yes [provider]  aspirin EC 325 MG tablet Take 325 mg by mouth daily.   Yes [provider]  atorvastatin (LIPITOR) 40 MG tablet  Take 40 mg by mouth daily at 6 PM.  11/09/16  Yes [provider]  carvedilol (COREG) 12.5 MG tablet Take 12.5 mg by mouth 2 (two) times daily with a meal.  11/09/16  Yes [provider]  cholecalciferol (VITAMIN D) 1000 units tablet Take 1,000 Units by mouth daily.   Yes [provider]  clopidogrel (PLAVIX) 75 MG tablet Take 75 mg by mouth daily.  11/09/16  Yes [provider]  docusate sodium (COLACE) 100 MG capsule Take 1 capsule (100 mg total) by mouth 2 (two) times daily. 10/21/18  Yes Meccariello, Bernita Raisin, DO  Ensure Plus (ENSURE PLUS) LIQD Take 237 mLs by mouth 2 (two) times daily between meals.   Yes [provider]  ferrous sulfate 325 (65 FE) MG tablet Take 325 mg by mouth daily.   Yes [provider]  furosemide (LASIX) 20 MG tablet Take 20 mg by mouth daily.    Yes [provider]  Melatonin 3 MG TABS Take 3 mg by mouth at bedtime.   Yes [provider]  mirtazapine (REMERON) 7.5 MG tablet Take 7.5 mg by mouth at bedtime.   Yes [provider]  Multiple Vitamins-Minerals (ONE-A-DAY PROACTIVE 65+) TABS Take 1 tablet by mouth daily with breakfast.   Yes [provider]  lactose free nutrition (BOOST PLUS) LIQD Take 237 mLs by mouth 2 (two) times daily between meals. Patient not taking: Reported on 11/12/2018 10/21/18   Meccariello, Bernita Raisin, DO  Multiple Vitamin (MULTIVITAMIN WITH MINERALS) TABS tablet Take 1 tablet by mouth daily. Patient not taking: Reported on  11/12/2018 10/22/18   Meccariello, Bernita Raisin, DO   Allergies  Allergen Reactions   Lactose Intolerance (Gi) Other (See Comments)    "Allergic," per [facility]   Review of Systems  Unable to perform ROS: Dementia    Physical Exam Constitutional:      Appearance: He is cachectic.  Cardiovascular:     Rate and Rhythm: Normal rate and regular rhythm.     Heart sounds: Normal heart sounds.  Pulmonary:     Breath sounds: Decreased breath sounds present.  Skin:    General: Skin is warm and dry.  Neurological:     Mental Status: He is lethargic.  Psychiatric:        Speech: He is noncommunicative.        Behavior: Behavior is agitated.        Cognition and Memory: Cognition is impaired.     Vital Signs: BP (!) 165/72 (BP Location: Left Arm)    Pulse 68    Temp (!) 97.3 F (36.3 C) (Axillary)    Resp 15    Ht 5\' 11"  (1.803 m)    Wt 60 kg    SpO2 96%    BMI 18.45 kg/m  Pain Scale: PAINAD       SpO2: SpO2: 96 % O2 Device:SpO2: 96 % O2 Flow Rate: .O2 Flow Rate (L/min): 3 L/min  IO: Intake/output summary:   Intake/Output Summary (Last 24 hours) at 11/13/2018 1041 Last data filed at 11/13/2018 6789 Gross per 24 hour  Intake 1427.5 ml  Output 1100 ml  Net 327.5 ml    LBM:   Baseline Weight: Weight: 60 kg Most recent weight: Weight: 60 kg     Palliative Assessment/Data:   Discussed with Dr Wilfred Lacy the family aware that this nurse practitioner would not be in the hospital again until Monday however they had questions or concerns they could  call the team phone.  Contact information given  Time In: 0900 Time Out: 1015 Time Total: 75 minutes Greater than 50%  of this time was spent counseling and coordinating care related to the above assessment and plan.  Signed by: Maurice Lessen, NP   Please contact Palliative Medicine Team phone at [phone] for questions and concerns.  For individual provider: See Shea Evans

## 2018-11-13 NOTE — Progress Notes (Signed)
Due to the discrepancy in different physical exams throughout the morning, patient was visited again at bedside at about 4 PM.  This afternoon, patient was awake alert and oriented to person and place.  He was able to state his name and that he was at [facility].  His speech remains mildly dysarthric but intelligible.  He was able to state his name and the fact that he was in [facility].  He responded that the year was 1935 and he did not know the president.  When asked how many children he had, he responded "35 or 40."  He had no physical complaints at the time of this interview.  Assessment: The changing mental status documented throughout the day may be reflection of waxing and waning delirium or potentially post ictal state manifest during the morning which later dissipated.  We will continue to work-up as noted in today's progress note.

## 2018-11-13 NOTE — Progress Notes (Signed)
Initial Nutrition Assessment  DOCUMENTATION CODES:   Underweight  INTERVENTION:    Ensure Enlive po TID, each supplement provides 350 kcal and 20 grams of protein  Liberalize diet to regular   MVI daily  NUTRITION DIAGNOSIS:   Increased nutrient needs related to wound healing as evidenced by estimated needs.  GOAL:   Patient will meet greater than or equal to 90% of their needs  MONITOR:   PO intake, Supplement acceptance, Weight trends, Labs, I & O's, Skin  REASON FOR ASSESSMENT:   Malnutrition Screening Tool    ASSESSMENT:   Patient with PMH significant for advanced dementia, CHF, COPD, HLD, HTN, CAD, and multiple falls. Present this admission with AMS from unknown etiology.   RD working remotely.  Neurology saw pt this am. Pt may have possible seizure activity. Plan for EEG. PMT consulted for [place].   Unable to obtain history given advanced dementia. Meal completions charted as 25-75% for pt's last two meals. RD to provide supplementation to maximize kcal and protein this admission.   Weight records are limited in chart. Unsure if pt had unintentional wt loss PTA. Given low weight status pt may have some form of malnutrition but unable to diagnosis without detailed history and physical exam.   Medications: colace, remeron, 40 mEq KCl once Labs: reviewed   Diet Order:   Diet Order            Diet heart healthy/carb modified Room service appropriate? Yes; Fluid consistency: Thin  Diet effective now              EDUCATION NEEDS:   Not appropriate for education at this time  Skin:  Skin Assessment: Skin Integrity Issues: Skin Integrity Issues:: Stage II Stage II: buttocks  Last BM:  PTA  Height:   Ht Readings from Last 1 Encounters:  11/13/18 5\' 11"  (1.803 m)    Weight:   Wt Readings from Last 1 Encounters:  11/13/18 60 kg    Ideal Body Weight:  78.2 kg  BMI:  Body mass index is 18.45 kg/m.  Estimated Nutritional Needs:   Kcal:   1800-2000 kcal  Protein:  90-105 grams  Fluid:  >/= 1.8 L/day  Mariana Single RD, LDN Clinical Nutrition Pager # - [phone]

## 2018-11-13 NOTE — Procedures (Signed)
EEG Report  Clinical History:  Altered mental status.  Technical Summary:  A 19 channel digital EEG recording was performed using the 10-20 international system of electrode placement.  Bipolar and Referential montages were used.  The total recording time was approx 20 minutes.  Findings:  There is a posterior frequency of 7 Hz at maximum wakefulness.  She gets drowsy frequently and enters stage N2 sleep for prolonged periods.   No focal slowing is present. There are no epileptiform discharges or electrographic seizures.  Impression:  This is an abnormal EEG.  There is evidence of mild generalized slowing of brain activity. This is non-specific but may be due to dementia, toxic, metabolic, or infectious etiologies.  If clinically indicated a repeat EEG when he is more awake and alert may be of value in the future.    Rogue Jury, MS, MD

## 2018-11-13 NOTE — Progress Notes (Addendum)
FPTS Interim Progress Note  Received call from intern Dr. Pilar Plate informing me that patient seemed to have acute change in mental status.  Per H&P from tried hospitalist it appears that patient was communicative on admission.  RN overnight also informed day team RN that patient was talkative all night long.  On arrival patient had garbled speech and was not communicating well.  Appeared to have wanted to say sentences but speech was very difficult to understand.  This appeared his acute change compared to H&P note.  Stat head CT was ordered, patient unable to obtain MRI secondary to pacemaker.  Neurology was called and recommended continuing work-up of MRI and EEG.  They will see as formal consult this morning as they were only curb sided on admission.  CBG was 75. Stat head CT was ordered as this appears to be an acute change. CT head on admission showing no acute intracranial abnormality. Medications reviewed, remeron given overnight but no other antipsychotics or narcotics.   I called patient's son, Ewart Carrera, to obtain a baseline.  Per son patient has a baseline dementia that has worsened in the past 2 months.  Could oriented to self only but ever since his 2 recent surgeries they cannot understand anything he says.  States that prior to surgery his words were clear but what he says did not make sense.  After his surgeries his speech is "mottled" and kept getting worse.  Patient also has a baseline hearing problem and may not understand what people are saying especially if they are on his left side as his hearing is better on his right. Progress note from 6/2 showing that neuro exam was "opens eyes during exam, non verbal".   O: BP 131/63 (BP Location: Left Arm)   Pulse 77   Temp (!) 97.5 F (36.4 C) (Axillary)   Resp 12   Ht 5\' 11"  (1.803 m)   Wt 60 kg   SpO2 98%   BMI 18.45 kg/m   Gen: Awake, difficult to understand due to garbled speech Neuro: Only following some commands  A/P: AMS Seems  to be at baseline per son's report  -neuro consulted, appreciate recommendations -cancel stat head CT -EEG -unable to obtain MRI 2/2 pacemaker  -will call son with any updates   Caroline More, DO 11/13/2018, 8:22 AM PGY-2, [facility] pager [phone]

## 2018-11-13 NOTE — Progress Notes (Signed)
EEG Completed; Results Pending  

## 2018-11-13 NOTE — Consult Note (Addendum)
Neurology Consultation  Reason for Consult: Altered mental status Referring Physician: Evette Doffing  History is obtained from: Chart  HPI: Maurice Mckee is a 85 y.o. male end-stage dementia, hypertension, hyperlipidemia, CAD, cardiomyopathy, cancer and arthritis.  Patient lives in a skilled nursing facility and has been noted to have multiple falls in the past.  While he was at the skilled nursing facility he was noted to have episodes of staring.    There was concern for further seizures.  Staff also noticed when he would come back to normal he appeared confused more so than the prior  to event.  In route to the hospital by EMS he was also noted to have period of hypotension systolically he was in the 60s which resolved with IV fluids.  He has not had a staring spell while in the hospital.  Patient has been brought to the hospital for further evaluation.  I cannot get any history from patient.  From a neurological standpoint we recommended MRI if possible and EEG.  CT was obtained while in the ED which showed no CT evidence for acute intracranial abnormality.  Currently he is getting his EEG.  SNF call: Per nurse of patient, physical therapy has been working with the patient for approximately 15 minutes when patient suddenly had a blank stare.  She states that this staring spell per physical therapy lasted for about 5 minutes with drooling and did not respond to any stimulus.  He eventually started to come around slowly and was able to follow commands.  This has never happened before that she knows of.  As far as blood pressure she does not know what his blood pressure was at that time.  Son call: Son states he is never seen any of the staring spells prior.  He has recently had 2 hip surgeries.  He does not do well post-anesthesia.  He has noted a decline in patient's memory since the surgeries.  ED course: CT of head  work up done: -CT of head -Urinalysis which was negative, urine culture which was  negative, ethanol which was negative, initial potassium was 3.4, most recent vital signs have been temperature 97.3 with a pulse rate of 68 and respiration of 15.  Blood pressure was 165/72 at 1011 today.  EKG was normal sinus rhythm.  EEG done pending reading  ROS:  Unable to obtain due to altered mental status.   Past Medical History:  Diagnosis Date  . Arthritis   . Cancer ([place])   . Cardiomyopathy ([place])   . CHF (congestive heart failure) ([place])   . COPD (chronic obstructive pulmonary disease) ([place])   . Coronary artery disease   . Dementia ([place])   . Hyperlipidemia   . Hypertension       Family History  Problem Relation Age of Onset  . Cancer Mother    Social History:   reports that he has never smoked. He has never used smokeless tobacco. He reports that he does not drink alcohol or use drugs.  Medications  Current Facility-Administered Medications:  .  0.9 %  sodium chloride infusion, , Intravenous, Continuous, Vashti Hey, MD, Last Rate: 75 mL/hr at 11/12/18 1930 .  acetaminophen (TYLENOL) tablet 650 mg, 650 mg, Oral, Q6H PRN, Bonnell Public Tublu, MD .  aspirin EC tablet 325 mg, 325 mg, Oral, Daily, Jamse Arn, Dewaine Oats Tublu, MD .  atorvastatin (LIPITOR) tablet 40 mg, 40 mg, Oral, q1800, Vashti Hey, MD, 40 mg at 11/13/18 0014 .  carvedilol (COREG)  tablet 12.5 mg, 12.5 mg, Oral, BID WC, Chatterjee, Dewaine Oats Tublu, MD .  clopidogrel (PLAVIX) tablet 75 mg, 75 mg, Oral, Daily, Jamse Arn, Dewaine Oats Tublu, MD .  docusate sodium (COLACE) capsule 100 mg, 100 mg, Oral, BID, Bonnell Public Tublu, MD, 100 mg at 11/13/18 0014 .  enoxaparin (LOVENOX) injection 40 mg, 40 mg, Subcutaneous, Q24H, Bonnell Public Tublu, MD, 40 mg at 11/13/18 0012 .  feeding supplement (ENSURE ENLIVE) (ENSURE ENLIVE) liquid 237 mL, 237 mL, Oral, BID BM, Bonnell Public Tublu, MD, 237 mL at 11/13/18 0016 .  Melatonin TABS 3 mg, 3 mg, Oral, QHS, Bonnell Public  Tublu, MD, 3 mg at 11/13/18 0017 .  mirtazapine (REMERON) tablet 7.5 mg, 7.5 mg, Oral, QHS, Bonnell Public Tublu, MD, 7.5 mg at 11/13/18 0014 .  potassium chloride SA (K-DUR) CR tablet 40 mEq, 40 mEq, Oral, Once, Mullis, [employer], DO   Exam: Current vital signs: BP 131/63 (BP Location: Left Arm)   Pulse 77   Temp (!) 97.5 F (36.4 C) (Axillary)   Resp 12   Ht 5\' 11"  (1.803 m)   Wt 60 kg   SpO2 98%   BMI 18.45 kg/m  Vital signs in last 24 hours: Temp:  [97.5 F (36.4 C)-98.3 F (36.8 C)] 97.5 F (36.4 C) (06/25 0300) Pulse Rate:  [57-79] 77 (06/25 0300) Resp:  [11-16] 12 (06/25 0300) BP: (112-148)/(63-91) 131/63 (06/25 0300) SpO2:  [62 %-100 %] 98 % (06/25 0300) Weight:  [60 kg] 60 kg (06/25 0300)  Physical Exam  Constitutional: Appears malnourished and cachectic.  Psych: Affect appropriate to situation Eyes: No scleral injection HENT: No OP obstrucion Head: Normocephalic.  Cardiovascular: Normal rate and regular rhythm.  Respiratory: Effort normal, non-labored breathing GI: Soft.  No distension. There is no tenderness.  Skin: WDI  Neuro: Mental Status: Patient is awake, does not want to take part in any form of the exam.  Cannot understand any communication as he is mumbling.  Does not follow instructions. Cranial Nerves: II: Attempted to test visual fields but patient would forcefully close his eyes III,IV, VI: Unable to evaluate due to above  V: Facial sensation attempted to test however patient swatted at practitioner VII: Facial movement is symmetric.  VIII: Could not test X: Could not test XI: Could not test AJG:OTLXB not test Motor: Tone is normal. Bulk is decreased. 5/5 strength was present in all four extremities.  Sensory: Withdraws from pain in all extremities Deep Tendon Reflexes: No ankle jerk however he does have 2+ brachioradialis Plantars: Toes are downgoing bilaterally.  Cerebellar: Unable to test  Labs I have reviewed labs in epic  and the results pertinent to this consultation are:   CBC    Component Value Date/Time   WBC 11.2 (H) 11/13/2018 0633   RBC 3.53 (L) 11/13/2018 0633   HGB 10.9 (L) 11/13/2018 0633   HCT 34.9 (L) 11/13/2018 0633   HCT 26.1 (L) 10/11/2018 0327   PLT 224 11/13/2018 0633   MCV 98.9 11/13/2018 0633   MCH 30.9 11/13/2018 0633   MCHC 31.2 11/13/2018 0633   RDW 15.1 11/13/2018 0633   LYMPHSABS 3.5 11/12/2018 1350   MONOABS 1.1 (H) 11/12/2018 1350   EOSABS 0.3 11/12/2018 1350   BASOSABS 0.1 11/12/2018 1350    CMP     Component Value Date/Time   NA 141 11/13/2018 0633   NA 140 03/25/2018   K 3.7 11/13/2018 0633   CL 108 11/13/2018 0633   CO2 26 11/13/2018 2620  GLUCOSE 96 11/13/2018 0633   BUN 12 11/13/2018 0633   BUN 19 03/25/2018   CREATININE 0.77 11/13/2018 0633   CALCIUM 8.6 (L) 11/13/2018 0633   PROT 5.1 (L) 11/12/2018 1350   ALBUMIN 2.6 (L) 11/12/2018 1350   AST 21 11/12/2018 1350   ALT 21 11/12/2018 1350   ALKPHOS 123 11/12/2018 1350   BILITOT 0.4 11/12/2018 1350   GFRNONAA >60 11/13/2018 0633   GFRAA >60 11/13/2018 5631    Lipid Panel  No results found for: CHOL, TRIG, HDL, CHOLHDL, VLDL, LDLCALC, LDLDIRECT   Imaging I have reviewed the images obtained:  CT-scan of the brain-no CT evidence for acute intracranial abnormality.  MRI examination of the brain- pending if possible  Etta Quill PA-C [facility] [phone]  M-F  (9:00 am- 5:00 PM)  11/13/2018, 9:40 AM     Assessment: 85 year old male brought to the hospital secondary to altered mental status and periods of staring spells. As noted he was also found to be hypotensive which responded to normal saline.  Patient does not have a known history of seizure disorder, given advanced dementia he is at high risk for having seizures.  Metabolic and infectious work-up so far negative to explain encephalopathy.  Impression: - Possible seizure activity - Decreased mental status, likely  delirium in the setting of dementa vs post ictal state   Recommendations: -EEG --start empiric Keppra 250 mg BID  -MRI If AICD compatible for MRI brain  --Seizure precautions  NEUROHOSPITALIST ADDENDUM Performed a face to face diagnostic evaluation.   I have reviewed the contents of history and physical exam as documented by PA/ARNP/Resident and agree with above documentation.  I have discussed and formulated the above plan as documented. Edits to the note have been made as needed.  Impression: 85 year old male with advanced dementia presents to the hospital after having worsening mental status and staring episode.  Patient underwent metabolic and infectious work-up including CBC, UA and electrolytes that have been negative.  Patient is afebrile.  On examination, he is not very cooperative.  Mumbled incoherently.  Glabellar tap and palmomental reflex was absent.  No focal motor or cranial nerve deficits.    He does have advanced dementia, making him high risk for focal seizures.  I do think it is reasonable to start him on empiric Keppra a low dose of 250 mg twice daily.  Will follow EEG results.  MRI brain if possible given AICD.  Reviewed CT head no acute process noted    Karena Addison Aroor MD [facility] [phone]   If 7pm to 7am, please call on call as listed on AMION.

## 2018-11-14 DIAGNOSIS — Z9181 History of falling: Secondary | ICD-10-CM | POA: Diagnosis not present

## 2018-11-14 DIAGNOSIS — E739 Lactose intolerance, unspecified: Secondary | ICD-10-CM | POA: Diagnosis present

## 2018-11-14 DIAGNOSIS — Z7902 Long term (current) use of antithrombotics/antiplatelets: Secondary | ICD-10-CM | POA: Diagnosis not present

## 2018-11-14 DIAGNOSIS — F329 Major depressive disorder, single episode, unspecified: Secondary | ICD-10-CM | POA: Diagnosis present

## 2018-11-14 DIAGNOSIS — R64 Cachexia: Secondary | ICD-10-CM | POA: Diagnosis present

## 2018-11-14 DIAGNOSIS — I110 Hypertensive heart disease with heart failure: Secondary | ICD-10-CM | POA: Diagnosis present

## 2018-11-14 DIAGNOSIS — F0281 Dementia in other diseases classified elsewhere with behavioral disturbance: Secondary | ICD-10-CM

## 2018-11-14 DIAGNOSIS — R55 Syncope and collapse: Secondary | ICD-10-CM | POA: Diagnosis present

## 2018-11-14 DIAGNOSIS — Z7982 Long term (current) use of aspirin: Secondary | ICD-10-CM | POA: Diagnosis not present

## 2018-11-14 DIAGNOSIS — J449 Chronic obstructive pulmonary disease, unspecified: Secondary | ICD-10-CM | POA: Diagnosis present

## 2018-11-14 DIAGNOSIS — I959 Hypotension, unspecified: Secondary | ICD-10-CM | POA: Diagnosis present

## 2018-11-14 DIAGNOSIS — I5022 Chronic systolic (congestive) heart failure: Secondary | ICD-10-CM | POA: Diagnosis present

## 2018-11-14 DIAGNOSIS — I2510 Atherosclerotic heart disease of native coronary artery without angina pectoris: Secondary | ICD-10-CM | POA: Diagnosis present

## 2018-11-14 DIAGNOSIS — R410 Disorientation, unspecified: Secondary | ICD-10-CM | POA: Diagnosis not present

## 2018-11-14 DIAGNOSIS — Z681 Body mass index (BMI) 19 or less, adult: Secondary | ICD-10-CM | POA: Diagnosis not present

## 2018-11-14 DIAGNOSIS — I10 Essential (primary) hypertension: Secondary | ICD-10-CM | POA: Diagnosis not present

## 2018-11-14 DIAGNOSIS — E785 Hyperlipidemia, unspecified: Secondary | ICD-10-CM | POA: Diagnosis present

## 2018-11-14 DIAGNOSIS — Z1159 Encounter for screening for other viral diseases: Secondary | ICD-10-CM | POA: Diagnosis not present

## 2018-11-14 DIAGNOSIS — Z7189 Other specified counseling: Secondary | ICD-10-CM | POA: Diagnosis not present

## 2018-11-14 DIAGNOSIS — R627 Adult failure to thrive: Secondary | ICD-10-CM | POA: Diagnosis present

## 2018-11-14 DIAGNOSIS — N400 Enlarged prostate without lower urinary tract symptoms: Secondary | ICD-10-CM | POA: Diagnosis present

## 2018-11-14 DIAGNOSIS — Z79899 Other long term (current) drug therapy: Secondary | ICD-10-CM | POA: Diagnosis not present

## 2018-11-14 DIAGNOSIS — M1611 Unilateral primary osteoarthritis, right hip: Secondary | ICD-10-CM | POA: Diagnosis present

## 2018-11-14 DIAGNOSIS — E559 Vitamin D deficiency, unspecified: Secondary | ICD-10-CM | POA: Diagnosis present

## 2018-11-14 DIAGNOSIS — F0390 Unspecified dementia without behavioral disturbance: Secondary | ICD-10-CM | POA: Diagnosis present

## 2018-11-14 DIAGNOSIS — Z515 Encounter for palliative care: Secondary | ICD-10-CM | POA: Diagnosis not present

## 2018-11-14 DIAGNOSIS — J439 Emphysema, unspecified: Secondary | ICD-10-CM | POA: Diagnosis not present

## 2018-11-14 DIAGNOSIS — G9340 Encephalopathy, unspecified: Secondary | ICD-10-CM | POA: Diagnosis present

## 2018-11-14 DIAGNOSIS — G301 Alzheimer's disease with late onset: Secondary | ICD-10-CM | POA: Diagnosis not present

## 2018-11-14 DIAGNOSIS — R404 Transient alteration of awareness: Secondary | ICD-10-CM | POA: Diagnosis not present

## 2018-11-14 DIAGNOSIS — R451 Restlessness and agitation: Secondary | ICD-10-CM | POA: Diagnosis not present

## 2018-11-14 DIAGNOSIS — R471 Dysarthria and anarthria: Secondary | ICD-10-CM | POA: Diagnosis present

## 2018-11-14 DIAGNOSIS — I429 Cardiomyopathy, unspecified: Secondary | ICD-10-CM | POA: Diagnosis present

## 2018-11-14 MED ORDER — LEVETIRACETAM 250 MG PO TABS
250.0000 mg | ORAL_TABLET | Freq: Two times a day (BID) | ORAL | Status: DC
  Administered 2018-11-14 – 2018-11-18 (×8): 250 mg via ORAL
  Filled 2018-11-14 (×8): qty 1

## 2018-11-14 MED ORDER — LEVETIRACETAM 250 MG PO TABS: 250.0000 mg | ORAL_TABLET | Freq: Two times a day (BID) | ORAL | 0 refills | Status: DC

## 2018-11-14 NOTE — TOC Initial Note (Signed)
Transition of Care [facility]) - Initial/Assessment Note    Patient Details  Name: Maurice Mckee MRN: [id number] Date of Birth: January 04, 1926  [facility] CM/SW Contact:    Maurice Feil, LCSW Phone Number: 11/14/2018, 4:19 PM  Clinical Narrative:  CSW talked with son Maurice Mckee [phone]) regarding patient's discharge disposition. Son confirmed that his dad came to hospital from [facility], where he is receiving short-term rehab and will return there at discharge. When asked, son reported that his dad receives 24/7 care at home. Maurice Mckee indicated that he is one of 6 children and lives in [place].                 Expected Discharge Plan: [place]     Patient Goals and CMS Choice Patient states their goals for this hospitalization and ongoing recovery are:: Son indicated that he wants dad to return to SNF to complete rehab before returning home. Per son his dad will continue having 24 hour care at home CMS Medicare.gov Compare Post Acute Care list provided to:: Other (Comment Required)(Not needed as patient will return to [facility]) Choice offered to / list presented to : NA(Patient will return to [facility] to)  Expected Discharge Plan and Services Expected Discharge Plan: [place] arrangements for the past 2 months: [place] Expected Discharge Date: 11/14/18                                   Prior Living Arrangements/Services Living arrangements for the past 2 months: [place] Lives with:: Self, Other (Comment)(Patient has 24/7 care at home) Patient language and need for interpreter reviewed:: No Do you feel safe going back to the place where you live?: Yes      Need for Family Participation in Patient Care: Yes (Comment) Care giver support system in place?: Yes (comment)   Criminal Activity/Legal Involvement Pertinent to Current Situation/Hospitalization: No - Comment as  needed  Activities of Daily Living Home Assistive Devices/Equipment: Wheelchair, Environmental consultant (specify type) ADL Screening (condition at time of admission) Patient's cognitive ability adequate to safely complete daily activities?: No Is the patient deaf or have difficulty hearing?: No Does the patient have difficulty seeing, even when wearing glasses/contacts?: No Does the patient have difficulty concentrating, remembering, or making decisions?: Yes Patient able to express need for assistance with ADLs?: Yes Does the patient have difficulty dressing or bathing?: Yes Independently performs ADLs?: No Does the patient have difficulty walking or climbing stairs?: Yes Weakness of Legs: Both Weakness of Arms/Hands: None  Permission Sought/Granted Permission sought to share information with : Family Supports Permission granted to share information with : No(Due to patient's orientation status)              Emotional Assessment Appearance:: Other (Comment Required(Did not visit with patient) Attitude/Demeanor/Rapport: Unable to Assess Affect (typically observed): Unable to Assess(Did not visit with patient) Orientation: : Oriented to Self Alcohol / Substance Use: Never Used Psych Involvement: No (comment)  Admission diagnosis:  Near syncope [R55] Hypotension, unspecified hypotension type [I95.9] Patient Active Problem List   Diagnosis Date Noted  . Agitation   . Altered mental status, unspecified 11/12/2018  . Hypoxia   . Protein-calorie malnutrition, severe 10/16/2018  . Pneumothorax   . Pressure injury of skin 10/11/2018  . Palliative care by specialist   . Hip fracture ([place]) 10/10/2018  .  Multiple falls 03/18/2018  . Fracture of inferior pubic ramus ([place]) 03/18/2018  . Delirium 03/18/2018  . BPH (benign prostatic hyperplasia) 03/18/2018  . 'light-for-dates' infant with signs of fetal malnutrition 09/09/2017  . Urinary urgency 06/21/2017  . Spinal stenosis of lumbar region  05/26/2017  . Fracture of two ribs of right side, closed, initial encounter 01/25/2017  . Anemia, normocytic normochromic 11/09/2016  . Non-seasonal allergic rhinitis 02/15/2016  . Risk for falls 11/16/2015  . Vision problem 11/16/2015  . Vitamin D deficiency 07/29/2015  . Urinary incontinence 07/08/2015  . Pulmonary emphysema ([place]) 11/26/2014  . 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 Primary osteoarthritis of right hip 08/30/2014  . Hyperlipidemia 05/10/2014  . Right hip pain 12/24/2013  . Enlarged prostate without lower urinary tract symptoms (luts) 12/24/2013  . Hyperglycemia 12/24/2013  . Numbness 12/24/2013  . Chronic systolic congestive heart failure ([place]) 06/11/2013  . Congestive heart failure ([place]) 06/11/2013  . Dementia with behavioral disturbance ([place]) 06/11/2013  . Essential hypertension 06/11/2013  . Primary cardiomyopathy ([place]) 06/11/2013  . Abnormal glucose 03/02/2013  . Diarrhea 03/02/2013  . Insomnia 03/02/2013  . Abnormality of gait 10/25/2012  . DNR (do not resuscitate) discussion 10/25/2012  . Generalized osteoarthritis of multiple sites 10/25/2012  . Skin sensation disturbance 10/25/2012  . Backache 08/20/2011  . Other allergy, other than to medicinal agents 01/19/2010   PCP:  Patient, No Pcp Per Pharmacy:   [facility], [address] [place] [address] Phone: [phone] Fax: [phone]     Social Determinants of Health (SDOH) Interventions  No SDOH interventions needed at this time  Readmission Risk Interventions No flowsheet data found.

## 2018-11-14 NOTE — Discharge Summary (Signed)
[facility] Discharge Summary  Patient name: Maurice Mckee Medical record number: [id number] Date of birth: [birth year] Age: 85 y.o. Gender: male Date of Admission: 11/12/2018  Date of Discharge: 11/14/2018 Admitting Physician: Vashti Hey, MD  Primary Care Provider: Patient, No Pcp Per Consultants: neuro  Indication for Hospitalization: AMS, transient  Discharge Diagnoses/Problem List:  Altered mental status with waxing and waning Dementia, end-stage CODE STATUS Transient hypotension-resolved Heart failure with reduced ejection fraction, ICD CAD MDD  Disposition: Discharge to SNF  Discharge Condition: Stable  Discharge Exam:  General: Arousable, difficult to understand, not following commands.  No evident pain this morning.  Again, appears disoriented in the morning. Cardio: Normal S1 and S2, no S3 or S4. Rhythm is regular. No murmurs or rubs.   Pulm: Clear to auscultation bilaterally, no crackles, wheezing, or diminished breath sounds. Normal respiratory effort Abdomen: Bowel sounds normal. Abdomen soft and non-tender.  Extremities: No peripheral edema. Warm/ well perfused.  Strong radial pulse. Neuro: Cranial nerves grossly intact  Brief Hospital Course:  [name] a 85 y.o.malewho presented from SNF with AMS concerning for seizure. PMH is significant formultiple falls s/p left and right hip fracture on 4/16 and 5/24,recent history of pneumonia in April, advanced dementia, HFrEF (45-50%), HTN, HLD, and vitamin D deficiency.  Altered mental status, waxing and waning Presenting after experiencing a temporary alteration in consciousness while undergoing physical therapy at his SNF which self resolved shortly after. He was noted to also have simultaneous transient hypotension that resolved with IV fluids.  On assessment by the primary team, he was found to have a baseline mental status with orientation to self and location with no  concept of the year (guessed 48) no idea who is president and a guess that he had 35-40 children (patient has 5 children).  After discussing with son, this appears to be his baseline mental status.  EEG was performed and showed generalized slowing.    CT head showed no evidence of hemorrhagic stroke.  MRI brain could not be performed due to ICD.  Neurology was consulted and recommended empiric treatment with Keppra 250 mg twice daily.  Patient was started on Keppra as recommended by neurology.  No further work-up is recommended at this time.  Of note, he did demonstrate delirium during his time in the hospital with significantly impaired mental status in the mornings and resolution during the afternoons.  Goals of care Palliative care was consulted to address goals of care discussion with family members as patient lacks capacity.  Patient's son, Owen Pratte, discuss goals of care with palliative and reported that he would like everything done for his father as possible.  He ended siblings were not willing to de-escalate care at this time or consider movement to comfort care.  Tyr Franca reports that he and his siblings would like his father to remain a full code despite his age, poor health and significant disease burden.  Issues for Follow Up:  1. Encouraged goals of care discussion with son and family members for this gentleman who lacks capacity, has end-stage medical issues and significant comorbidities.  No significant improvement as expected in his health or mental status.  Significant Procedures: None  Significant Labs and Imaging:  Recent Labs  Lab 11/12/18 1350 11/12/18 1438 11/13/18 0633  WBC 12.5*  --  11.2*  HGB 10.9* 13.9 10.9*  HCT 35.6* 41.0 34.9*  PLT 233  --  224   Recent Labs  Lab 11/12/18 1350 11/12/18 1438  11/13/18 0633  NA 139 139 141  K 3.4* 3.6 3.7  CL 105  --  108  CO2 27  --  26  GLUCOSE 86  --  96  BUN 13  --  12  CREATININE 1.03  --  0.77  CALCIUM 8.7*  --   8.6*  MG  --   --  2.0  ALKPHOS 123  --   --   AST 21  --   --   ALT 21  --   --   ALBUMIN 2.6*  --   --     Dg Chest 1 View  Result Date: 10/19/2018 CLINICAL DATA:  Status post right chest tube removal today. EXAM: CHEST  1 VIEW COMPARISON:  Single-view of the chest earlier today and 10/18/2018. FINDINGS: Pigtail catheter has been removed from the right chest. No pneumothorax. Right infrahilar opacity is unchanged. Left lung clear. Heart size normal. Aortic atherosclerosis noted. No pleural effusion. AICD in place. IMPRESSION: Negative for pneumothorax after chest tube removal. No other change since the exam earlier today. Electronically Signed   By: Inge Rise M.D.   On: 10/19/2018 11:09   Dg Chest 1 View  Result Date: 10/19/2018 CLINICAL DATA:  Follow-up pneumothorax EXAM: CHEST  1 VIEW COMPARISON:  Chest radiograph from one day prior. FINDINGS: Stable configuration of 2 lead left subclavian ICD. Right pigtail chest tube in place. Stable cardiomediastinal silhouette with normal heart size. No appreciable residual pneumothorax. No significant pleural effusion. No pulmonary edema. Patchy right infrahilar opacity appears stable. Clear left lung. IMPRESSION: 1. No appreciable residual pneumothorax.  Right chest tube in place. 2. Stable patchy right infrahilar opacity. Continued chest radiograph follow-up advised. Electronically Signed   By: Ilona Sorrel M.D.   On: 10/19/2018 09:57   Ct Head Wo Contrast  Result Date: 11/12/2018 CLINICAL DATA:  Dementia altered mental status EXAM: CT HEAD WITHOUT CONTRAST TECHNIQUE: Contiguous axial images were obtained from the base of the skull through the vertex without intravenous contrast. COMPARISON:  CT brain 10/10/2018 FINDINGS: Brain: No acute territorial infarction, hemorrhage or intracranial mass. Atrophy and small vessel ischemic changes of the white matter. Chronic infarcts within the left basal ganglia and left frontal white matter with ex vacuo  dilatation of the anterior horn of the ventricle on the left. Ventricles are stable in size and morphology. Vascular: No hyperdense vessels.  Carotid vascular calcification Skull: Normal. Negative for fracture or focal lesion. Sinuses/Orbits: Completely opacified left maxillary sinus with curvilinear calcifications as before. Patchy mucosal thickening in the ethmoid sinuses Other: None IMPRESSION: 1. No CT evidence for acute intracranial abnormality. 2. Atrophy and small vessel ischemic changes of the white matter. Electronically Signed   By: Donavan Foil M.D.   On: 11/12/2018 14:47   Dg Chest Port 1 View  Result Date: 11/12/2018 CLINICAL DATA:  Altered mental status. EXAM: PORTABLE CHEST 1 VIEW COMPARISON:  None. FINDINGS: The heart size and mediastinal contours are within normal limits. Left-sided pacemaker is unchanged in position. Atherosclerosis of thoracic aorta is noted. No pneumothorax or pleural effusion is noted. Both lungs are clear. The visualized skeletal structures are unremarkable. IMPRESSION: No acute cardiopulmonary abnormality seen. Aortic Atherosclerosis (ICD10-I70.0). Electronically Signed   By: Marijo Conception M.D.   On: 11/12/2018 13:52   Dg Chest Port 1 View  Result Date: 10/18/2018 CLINICAL DATA:  Pneumothorax EXAM: PORTABLE CHEST 1 VIEW COMPARISON:  Chest radiograph from one day prior. FINDINGS: Stable configuration of 2 lead left subclavian ICD.  Stable right basilar pigtail chest tube. Stable cardiomediastinal silhouette with normal heart size. Tiny right apical pneumothorax, less than 5%, not definitely seen on prior. No left pneumothorax. No pleural effusion. Patchy lower right lung opacity is similar. No pulmonary edema. No mediastinal shift. IMPRESSION: 1. Tiny right apical pneumothorax, less than 5%, not definitely seen on prior. Right pigtail chest tube in place. No mediastinal shift. 2. Stable patchy lower right lung opacity. Electronically Signed   By: Ilona Sorrel M.D.    On: 10/18/2018 09:46   Dg Chest Port 1 View  Result Date: 10/17/2018 CLINICAL DATA:  85 year old male with history of pneumothorax. Chest tube evaluation. EXAM: PORTABLE CHEST 1 VIEW COMPARISON:  Chest x-ray 10/16/2018. FINDINGS: Small bore right-sided chest tube has shifted in position, now with the pigtail in place projecting over the mid right hemithorax. No appreciable right-sided pneumothorax is identified on today's examination. There continues to be some subcutaneous emphysema in the right lateral chest wall. Lung volumes are low. Irregular opacities in the right mid to lower lung may reflect areas of atelectasis and/or consolidation. Overall, aeration appears slightly improved. Left lung is clear. No pleural effusions. No evidence of pulmonary edema. Heart size is mildly enlarged. Upper mediastinal contours are within normal limits. Aortic atherosclerosis. Left-sided pacemaker/AICD with lead tips projecting over the expected location of the right atrium and right ventricular apex. IMPRESSION: 1. Support apparatus, as above. 2. No definite residual pneumothorax. 3. Ill-defined opacities in the right mid to lower lung may reflect areas of residual atelectasis and/or consolidation, with slight improving aeration. 4. Mild cardiomegaly. 5. Aortic atherosclerosis. Electronically Signed   By: Vinnie Langton M.D.   On: 10/17/2018 07:50   Dg Chest Port 1 View  Result Date: 10/16/2018 CLINICAL DATA:  Chest tube placement.  Pleural effusion. EXAM: PORTABLE CHEST 1 VIEW COMPARISON:  July 17, 2018 FINDINGS: Chest tube remains on the right, currently with the pigtail more laterally and inferiorly position than 1 day prior. There is a minimal right apical pneumothorax. There is a small amount of residual pleural effusion on the right, less than on 1 day prior. There is atelectatic change in the right base. The left lung is clear. Heart is upper normal in size with pulmonary vascularity normal. Pacemaker leads  are attached to the right atrium and right ventricle. There is aortic atherosclerosis. No adenopathy. Bones are osteoporotic. IMPRESSION: Chest tube remains on the right, although the pigtail is more inferiorly and laterally positioned than on 1 day prior. There is a minimal right apical pneumothorax. There is a small residual right effusion with right base atelectasis. Left lung clear. Stable cardiac silhouette. Aortic Atherosclerosis (ICD10-I70.0). Electronically Signed   By: Lowella Grip III M.D.   On: 10/16/2018 07:53     Results/Tests Pending at Time of Discharge: none  Discharge Medications:  Allergies as of 11/14/2018      Reactions   Lactose Intolerance (gi) Other (See Comments)   "Allergic," per [facility]      Medication List    TAKE these medications   acetaminophen 325 MG tablet Commonly known as: TYLENOL Take 650 mg by mouth every 6 (six) hours as needed (for pain or fever >101 F).   aspirin EC 325 MG tablet Take 325 mg by mouth daily.   atorvastatin 40 MG tablet Commonly known as: LIPITOR Take 40 mg by mouth daily at 6 PM.   carvedilol 12.5 MG tablet Commonly known as: COREG Take 12.5 mg by mouth 2 (two) times daily with  a meal.   cholecalciferol 1000 units tablet Commonly known as: VITAMIN D Take 1,000 Units by mouth daily.   clopidogrel 75 MG tablet Commonly known as: PLAVIX Take 75 mg by mouth daily.   docusate sodium 100 MG capsule Commonly known as: COLACE Take 1 capsule (100 mg total) by mouth 2 (two) times daily.   ferrous sulfate 325 (65 FE) MG tablet Take 325 mg by mouth daily.   furosemide 20 MG tablet Commonly known as: LASIX Take 20 mg by mouth daily.   Ensure Plus Liqd Take 237 mLs by mouth 2 (two) times daily between meals.   lactose free nutrition Liqd Take 237 mLs by mouth 2 (two) times daily between meals.   levETIRAcetam 250 MG tablet Commonly known as: KEPPRA Take 1 tablet (250 mg total) by mouth 2 (two) times daily.    Melatonin 3 MG Tabs Take 3 mg by mouth at bedtime.   mirtazapine 7.5 MG tablet Commonly known as: REMERON Take 7.5 mg by mouth at bedtime.   multivitamin with minerals Tabs tablet Take 1 tablet by mouth daily.   One-A-Day Proactive 65+ Tabs Take 1 tablet by mouth daily with breakfast.       Discharge Instructions: Please refer to Patient Instructions section of EMR for full details.  Patient was counseled important signs and symptoms that should prompt return to medical care, changes in medications, dietary instructions, activity restrictions, and follow up appointments.   Follow-Up Appointments:   Matilde Haymaker, MD 11/14/2018, 12:04 PM PGY-1, [place]

## 2018-11-14 NOTE — Discharge Instructions (Signed)
You were admitted to the hospital due to concern for a transient episode of staring and being nonresponsive.  There was concern that you may be having a seizure.  While you were here in the hospital, a CT of your head showed no evidence of bleeding in your head and an electroencephalogram did not support evidence of seizure.  You were started on an antiseizure medication called Keppra.  Continue to take this medication twice daily.

## 2018-11-14 NOTE — Progress Notes (Signed)
Reviewed  EEG report, showed generalized slowing with no epileptiform discharges,.   Neurology will be available as needed

## 2018-11-14 NOTE — Evaluation (Signed)
Physical Therapy Evaluation Patient Details Name: Maurice Mckee MRN: [id number] DOB: 1925/07/20 Today's Date: 11/14/2018   History of Present Illness  Maurice Mckee is a 85 y.o. male presenting from SNF with AMS concerning for seizure. PMH is significant for multiple falls s/p left and right hip fracture on 4/16 and 5/24, recent history of pneumonia in April, advanced dementia, HFrEF (45-50%), HTN, HLD, and vitamin D deficiency.    Clinical Impression  Pt admitted with above diagnosis. Pt currently with functional limitations due to the deficits listed below (see PT Problem List). Pt unreliable historian wit hx of advanced dementia, per chart coming from SNF. Today, following cues and commands 50% of time, max A of 2 for standing EOB and short side steps.  Pt will benefit from skilled PT to increase their independence and safety with mobility to allow discharge to the venue listed below.       Follow Up Recommendations SNF    Equipment Recommendations  None recommended by PT    Recommendations for Other Services       Precautions / Restrictions Precautions Precautions: Fall Restrictions Weight Bearing Restrictions: No      Mobility  Bed Mobility Overal bed mobility: Needs Assistance Bed Mobility: Supine to Sit;Sit to Supine     Supine to sit: Max assist;+2 for physical assistance Sit to supine: Max assist;+2 for physical assistance      Transfers Overall transfer level: Needs assistance Equipment used: Rolling walker (2 wheeled) Transfers: Sit to/from Stand Sit to Stand: Max assist;+2 physical assistance            Ambulation/Gait             General Gait Details: unable  Stairs            Wheelchair Mobility    Modified Rankin (Stroke Patients Only)       Balance Overall balance assessment: Needs assistance   Sitting balance-Leahy Scale: Fair       Standing balance-Leahy Scale: Poor                               Pertinent  Vitals/Pain Pain Assessment: Faces Faces Pain Scale: No hurt Pain Intervention(s): Monitored during session    Home Living Family/patient expects to be discharged to:: Skilled nursing facility                      Prior Function Level of Independence: Needs assistance         Comments: unreliable historian     Hand Dominance   Dominant Hand: Right    Extremity/Trunk Assessment   Upper Extremity Assessment Upper Extremity Assessment: Difficult to assess due to impaired cognition    Lower Extremity Assessment Lower Extremity Assessment: Difficult to assess due to impaired cognition       Communication   Communication: No difficulties  Cognition Arousal/Alertness: Awake/alert   Overall Cognitive Status: No family/caregiver present to determine baseline cognitive functioning                                 General Comments: hx of advanced dementia at baseline      General Comments      Exercises     Assessment/Plan    PT Assessment Patient needs continued PT services  PT Problem List Decreased strength       PT Treatment Interventions  DME instruction;Gait training;Functional mobility training    PT Goals (Current goals can be found in the Care Plan section)  Acute Rehab PT Goals Patient Stated Goal: non stated PT Goal Formulation: With patient Time For Goal Achievement: 11/28/18 Potential to Achieve Goals: Fair    Frequency Min 2X/week   Barriers to discharge        Co-evaluation               AM-PAC PT "6 Clicks" Mobility  Outcome Measure Help needed turning from your back to your side while in a flat bed without using bedrails?: A Lot Help needed moving from lying on your back to sitting on the side of a flat bed without using bedrails?: A Lot Help needed moving to and from a bed to a chair (including a wheelchair)?: A Lot Help needed standing up from a chair using your arms (e.g., wheelchair or bedside chair)?:  Total Help needed to walk in hospital room?: Total Help needed climbing 3-5 steps with a railing? : Total 6 Click Score: 9    End of Session Equipment Utilized During Treatment: Gait belt Activity Tolerance: Patient tolerated treatment well Patient left: in bed;with call bell/phone within reach;with bed alarm set Nurse Communication: Mobility status PT Visit Diagnosis: Unsteadiness on feet (R26.81)    Time: 9794-8016 PT Time Calculation (min) (ACUTE ONLY): 25 min   Charges:   PT Evaluation $PT Eval Moderate Complexity: 1 Mod         Reinaldo Berber, PT, DPT Acute Rehabilitation Services Pager: [phone] Office: [phone]   Reinaldo Berber 11/14/2018, 11:33 AM

## 2018-11-14 NOTE — Progress Notes (Signed)
Occupational Therapy Evaluation Patient Details Name: Maurice Mckee MRN: [id number] DOB: 1925-11-27 Today's Date: 11/14/2018    History of Present Illness Maurice Mckee is a 85 y.o. male presenting from SNF with AMS concerning for seizure. PMH is significant for multiple falls s/p left and right hip fracture on 4/16 and 5/24, recent history of pneumonia in April, advanced dementia, HFrEF (45-50%), HTN, HLD, and vitamin D deficiency.   Clinical Impression   Pt admitted with above diagnosis. Pt from SNF. Pt with h/o dementia and is an unreliable historian, unable to provide information regarding function at SNF.  Pt requires +2 max assist for bed mobility and to perform sit<>stand.  Max-total assist for UB/LB bathing and dressing.  Recommend pt return to SNF for discharge planning. Will continue to follow acutely in order to maximize safety and independence with ADLs.    Follow Up Recommendations  SNF;Supervision/Assistance - 24 hour    Equipment Recommendations  Other (comment)(defer to next venue)    Recommendations for Other Services       Precautions / Restrictions Precautions Precautions: Fall Restrictions Weight Bearing Restrictions: No      Mobility Bed Mobility Overal bed mobility: Needs Assistance Bed Mobility: Supine to Sit;Sit to Supine     Supine to sit: Max assist;+2 for physical assistance Sit to supine: Max assist;+2 for physical assistance   General bed mobility comments: Assist to elevate trunk and bring LEs OOB. Use of pad to bring hips toward EOB.   Transfers Overall transfer level: Needs assistance Equipment used: Rolling walker (2 wheeled) Transfers: Sit to/from Stand Sit to Stand: Max assist;+2 physical assistance         General transfer comment: Assist for power up and hand placement on RW.    Balance Overall balance assessment: Needs assistance Sitting-balance support: No upper extremity supported;Feet supported Sitting balance-Leahy Scale:  Fair     Standing balance support: Bilateral upper extremity supported Standing balance-Leahy Scale: Poor                             ADL either performed or assessed with clinical judgement   ADL Overall ADL's : Needs assistance/impaired     Grooming: Wash/dry face;Set up;Sitting   Upper Body Bathing: Maximal assistance;Sitting   Lower Body Bathing: Maximal assistance;Sitting/lateral leans   Upper Body Dressing : Maximal assistance;Sitting   Lower Body Dressing: Total assistance;Sitting/lateral leans                       Vision   Vision Assessment?: (unable to formally assess, [facility] for tasks during eval)     Perception     Praxis      Pertinent Vitals/Pain Pain Assessment: Faces Faces Pain Scale: No hurt Pain Intervention(s): Monitored during session     Hand Dominance Right   Extremity/Trunk Assessment Upper Extremity Assessment Upper Extremity Assessment: Generalized weakness   Lower Extremity Assessment Lower Extremity Assessment: Defer to PT evaluation       Communication Communication Communication: Expressive difficulties;HOH(difficult to understand at times)   Cognition Arousal/Alertness: Awake/alert Behavior During Therapy: WFL for tasks assessed/performed Overall Cognitive Status: History of cognitive impairments - at baseline                                 General Comments: hx of advanced dementia at baseline   General Comments  Exercises     Shoulder Instructions      Home Living Family/patient expects to be discharged to:: Skilled nursing facility                                        Prior Functioning/Environment Level of Independence: Needs assistance        Comments: unreliable historian        OT Problem List: Decreased strength;Impaired balance (sitting and/or standing);Decreased activity tolerance;Decreased knowledge of use of DME or AE      OT  Treatment/Interventions: Self-care/ADL training;Therapeutic exercise;DME and/or AE instruction;Therapeutic activities;Patient/family education    OT Goals(Current goals can be found in the care plan section) Acute Rehab OT Goals Patient Stated Goal: non stated OT Goal Formulation: Patient unable to participate in goal setting Time For Goal Achievement: 11/28/18 Potential to Achieve Goals: Fair  OT Frequency: Min 2X/week   Barriers to D/C:            Co-evaluation PT/OT/SLP Co-Evaluation/Treatment: Yes Reason for Co-Treatment: For patient/therapist safety;To address functional/ADL transfers   OT goals addressed during session: ADL's and self-care      AM-PAC OT "6 Clicks" Daily Activity     Outcome Measure Help from another person eating meals?: A Little Help from another person taking care of personal grooming?: A Little Help from another person toileting, which includes using toliet, bedpan, or urinal?: Total Help from another person bathing (including washing, rinsing, drying)?: A Lot Help from another person to put on and taking off regular upper body clothing?: A Lot Help from another person to put on and taking off regular lower body clothing?: Total 6 Click Score: 12   End of Session Equipment Utilized During Treatment: Rolling walker;Gait belt Nurse Communication: Mobility status  Activity Tolerance: Patient tolerated treatment well Patient left: in bed;with call bell/phone within reach;with bed alarm set  OT Visit Diagnosis: Unsteadiness on feet (R26.81);Muscle weakness (generalized) (M62.81)                Time: 2956-2130 OT Time Calculation (min): 25 min Charges:  OT General Charges $OT Visit: 1 Visit OT Evaluation $OT Eval Moderate Complexity: 1 Mod   Darrol Jump OTR/L [place] [phone] 11/14/2018, 12:38 PM

## 2018-11-14 NOTE — Progress Notes (Signed)
Family Medicine Teaching Service Daily Progress Note Intern Pager: [phone]  Patient name: Maurice Mckee Medical record number: [id number] Date of birth: 18-Apr-1926 Age: 85 y.o. Gender: male  Primary Care Provider: Patient, No Pcp Per Consultants: Neuro Code Status: Full  Pt Overview and Major Events to Date:  6/24: Admitted for AMS  Assessment and Plan: Maurice Mckee a 85 y.o.malepresenting from SNF with AMS concerning for seizure. PMH is significant formultiple falls s/p left and right hip fracture on 4/16 and 5/24,recent history of pneumonia in April, advanced dementia, HFrEF (45-50%), HTN, HLD, and vitamin D deficiency.  Altered mental status, waxing and waning, stable Significant waxing and waning of mental status demonstrated on 6/25 during the day. Neurology recommends no treatment changes would come from repeat EEG. Medically stable for discharge. - Keppra 250 twice daily started per neuro recs - may follow up EEG while alert and oriented  Dementia, end stage Palliative care, was able to speak with the son on 6/25.  At this time, son is requesting full medical care/work-up.  He is not interested in moving toward comfort measures at this time.  He wishes to maintain a FULL code status for his 101 year old father with significant comorbidities. - recently discontinued Memantine and donepezil at discharge on 6/2 given age - Continue talks with family regarding code status given prognosis  H/o HTN: 160/87 this am.  Normal pressures overnight. - continue to monitor  HFrEF (45-50%)  Pacemaker: Euvolemic on exam. -Carvedilol 12.5 mg twice daily  CAD:  -Atorvastatin 40 mg daily -ASA 325 daily -Clopidogrel 75 mg daily  Depression: -Mirtazapine 7.5 mg nightly  FEN/GI:Heart Healthy/carb modified GGY:IRSWNIO  Disposition: medically stable for SNF  Subjective:  [name], but no complaints.   Objective: Temp:  [97.6 F (36.4 C)-97.8 F (36.6 C)] 97.8  F (36.6 C) (06/27 0436) Pulse Rate:  [67-75] 72 (06/27 0436) Resp:  [18] 18 (06/27 0436) BP: (108-160)/(85-87) 160/87 (06/27 0436) SpO2:  [97 %] 97 % (06/26 0852) Weight:  [60 kg] 60 kg (06/26 2127) Physical Exam: General: NAD, pleasant, sleeping Cardiovascular: RRR, no m/r/g, no LE edema Respiratory: CTA BL, normal work of breathing Gastrointestinal: soft, nontender, nondistended MSK: moves 4 extremities equally Derm: no rashes appreciated   Laboratory: Recent Labs  Lab 11/12/18 1350 11/12/18 1438 11/13/18 0633  WBC 12.5*  --  11.2*  HGB 10.9* 13.9 10.9*  HCT 35.6* 41.0 34.9*  PLT 233  --  224   Recent Labs  Lab 11/12/18 1350 11/12/18 1438 11/13/18 0633  NA 139 139 141  K 3.4* 3.6 3.7  CL 105  --  108  CO2 27  --  26  BUN 13  --  12  CREATININE 1.03  --  0.77  CALCIUM 8.7*  --  8.6*  PROT 5.1*  --   --   BILITOT 0.4  --   --   ALKPHOS 123  --   --   ALT 21  --   --   AST 21  --   --   GLUCOSE 86  --  96     Imaging/Diagnostic Tests: No results found.  Maurice Mckee, [place], DO 11/15/2018, 5:45 AM PGY-2, [place] Intern pager: [phone], text pages welcome

## 2018-11-14 NOTE — Plan of Care (Signed)
  Problem: Nutrition: Goal: Adequate nutrition will be maintained Outcome: Progressing   

## 2018-11-14 NOTE — Progress Notes (Addendum)
Patient stable for discharge to lower level of care. Is currently awaiting SNF placement. Unsafe to discharge home.   Marny Lowenstein, MD, MS FAMILY MEDICINE RESIDENT - PGY2 11/14/2018 12:40 PM

## 2018-11-14 NOTE — Progress Notes (Addendum)
Family Medicine Teaching Service Daily Progress Note Intern Pager: [phone]  Patient name: Maurice Mckee Medical record number: [id number] Date of birth: 12/15/1925 Age: 85 y.o. Gender: male  Primary Care Provider: Patient, No Pcp Per Consultants: Neuro Code Status: Full  Pt Overview and Major Events to Date:  6/24- admission for AMS  Assessment and Plan: Maurice Mckee is a 85 y.o. male presenting from SNF with AMS concerning for seizure. PMH is significant for multiple falls s/p left and right hip fracture on 4/16 and 5/24, recent history of pneumonia in April, advanced dementia, HFrEF (45-50%), HTN, HLD, and vitamin D deficiency.  Altered mental status, waxing and waning Significant waxing and waning of mental status demonstrated on 6/25 during the day.  Head CT showed no evidence of intracranial abnormalities, EEG showed generalized slowing with a recommendation of repeating when patient is demonstrating a normal mental status.  Neurology recommends moving forward with an MR brain if possible and empirically starting Keppra.  Contacted radiology, patient is not able to undergo MRI with his current defibrillator.  There may easily be an element of delirium contributing to his current fluctuating mental status. No treatment changes would come from repeat EEG. Medical workup is complete at this time.  Medically stable for DC. - Keppra 250 twice daily started per neuro recs - follow up EEG while alert and oriented - recently discontinued Memantine and donepezil at discharge on 6/2 given age  Dementia, end stage Mary, palliative care, was able to speak with the son on 6/25.  At this time, son is requesting full medical care/work-up.  He is not interested in moving toward comfort measures at this time.  He wishes to maintain a full CODE STATUS for his 9 year old father with significant comorbidities.  Transient Hypotension  H/o HTN: resolved: Blood pressure is responded well to gentle fluids.   Normal pressures overnight. - continue to monitor  HFrEF (45-50%)  Pacemaker: Euvolemic on exam. -Carvedilol 12.5 mg twice daily  CAD:  -Atorvastatin 40 mg daily -ASA 325 daily -Clopidogrel 75 mg daily  Depression: -Mirtazapine 7.5 mg nightly  FEN/GI: Heart Healthy/carb modified PPx: Lovenox  Disposition: medically stable for DC back to SNF  Subjective:  Easily arousable this morning.  Difficult to understand.  Not responsive to commands.  Similar to his presentation on the morning of 6/25.  He confirms that he is not currently in any pain.  Objective: Temp:  [97.3 F (36.3 C)-98.1 F (36.7 C)] 98 F (36.7 C) (06/26 0507) Pulse Rate:  [68-78] 70 (06/26 0507) Resp:  [15-18] 18 (06/26 0507) BP: (119-165)/(72-91) 138/74 (06/26 0507) SpO2:  [95 %-99 %] 96 % (06/26 0507) Weight:  [60 kg] 60 kg (06/25 2143)  Physical Exam: General: Arousable, difficult to understand, not following commands.  No evident pain this morning.  Again, appears disoriented in the morning. Cardio: Normal S1 and S2, no S3 or S4. Rhythm is regular. No murmurs or rubs.   Pulm: Clear to auscultation bilaterally, no crackles, wheezing, or diminished breath sounds. Normal respiratory effort Abdomen: Bowel sounds normal. Abdomen soft and non-tender.  Extremities: No peripheral edema. Warm/ well perfused.  Strong radial pulse. Neuro: Cranial nerves grossly intact    Laboratory: Recent Labs  Lab 11/12/18 1350 11/12/18 1438 11/13/18 0633  WBC 12.5*  --  11.2*  HGB 10.9* 13.9 10.9*  HCT 35.6* 41.0 34.9*  PLT 233  --  224   Recent Labs  Lab 11/12/18 1350 11/12/18 1438 11/13/18 0633  NA 139 139 141  K 3.4* 3.6 3.7  CL 105  --  108  CO2 27  --  26  BUN 13  --  12  CREATININE 1.03  --  0.77  CALCIUM 8.7*  --  8.6*  PROT 5.1*  --   --   BILITOT 0.4  --   --   ALKPHOS 123  --   --   ALT 21  --   --   AST 21  --   --   GLUCOSE 86  --  96   Imaging/Diagnostic Tests:  No results  found.  Matilde Haymaker, MD 11/14/2018, 6:10 AM PGY-1, [place] Intern pager: [phone], text pages welcome

## 2018-11-14 NOTE — Progress Notes (Signed)
Spoke with radiology, patient's AICD is not safe for MRI.

## 2018-11-15 NOTE — NC FL2 (Signed)
[place] MEDICAID FL2 LEVEL OF CARE SCREENING TOOL     IDENTIFICATION  Patient Name: Maurice Mckee Birthdate: [birth year] Sex: male Admission Date (Current Location): 11/12/2018  [facility] [place] Number:  Herbalist and Address:  [facility] [place], [place] [address], [place], [place] [address]      Provider Number: [id number]  Attending Physician Name and Address:  Zenia Resides, MD  Relative Name and Phone Number:  Stephan, Nelis, [phone]    Current Level of Care: Hospital Recommended Level of Care: [place] Prior Approval Number:    Date Approved/Denied:   PASRR Number: [id number]  Discharge Plan: SNF    Current Diagnoses: Patient Active Problem List   Diagnosis Date Noted  . Agitation   . Altered mental status, unspecified 11/12/2018  . Hypoxia   . Protein-calorie malnutrition, severe 10/16/2018  . Pneumothorax   . Pressure injury of skin 10/11/2018  . Palliative care by specialist   . Hip fracture ([place]) 10/10/2018  . Multiple falls 03/18/2018  . Fracture of inferior pubic ramus ([place]) 03/18/2018  . Delirium 03/18/2018  . BPH (benign prostatic hyperplasia) 03/18/2018  . 'light-for-dates' infant with signs of fetal malnutrition 09/09/2017  . Urinary urgency 06/21/2017  . Spinal stenosis of lumbar region 05/26/2017  . Fracture of two ribs of right side, closed, initial encounter 01/25/2017  . Anemia, normocytic normochromic 11/09/2016  . Non-seasonal allergic rhinitis 02/15/2016  . Risk for falls 11/16/2015  . Vision problem 11/16/2015  . Vitamin D deficiency 07/29/2015  . Urinary incontinence 07/08/2015  . Pulmonary emphysema ([place]) 11/26/2014  . 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 Primary osteoarthritis of right hip 08/30/2014  . Hyperlipidemia 05/10/2014  . Right hip pain 12/24/2013  . Enlarged prostate without lower urinary tract symptoms (luts) 12/24/2013  .  Hyperglycemia 12/24/2013  . Numbness 12/24/2013  . Chronic systolic congestive heart failure ([place]) 06/11/2013  . Congestive heart failure ([place]) 06/11/2013  . Dementia with behavioral disturbance ([place]) 06/11/2013  . Essential hypertension 06/11/2013  . Primary cardiomyopathy ([place]) 06/11/2013  . Abnormal glucose 03/02/2013  . Diarrhea 03/02/2013  . Insomnia 03/02/2013  . Abnormality of gait 10/25/2012  . DNR (do not resuscitate) discussion 10/25/2012  . Generalized osteoarthritis of multiple sites 10/25/2012  . Skin sensation disturbance 10/25/2012  . Backache 08/20/2011  . Other allergy, other than to medicinal agents 01/19/2010    Orientation RESPIRATION BLADDER Height & Weight     Self  Normal Incontinent, External catheter Weight: 132 lb 4.4 oz (60 kg) Height:  5\' 11"  (180.3 cm)  BEHAVIORAL SYMPTOMS/MOOD NEUROLOGICAL BOWEL NUTRITION STATUS      Incontinent Diet(Regular diet, thin liquids)  AMBULATORY STATUS COMMUNICATION OF NEEDS Skin   Extensive Assist Verbally Bruising, Skin abrasions(brusing on arm/leg/hand; pressure injury on buttocks & ulcer on sacrum (dressing in place for both))                       Personal Care Assistance Level of Assistance  Bathing, Feeding, Dressing, Total care Bathing Assistance: Maximum assistance Feeding assistance: Limited assistance(Needs assistance) Dressing Assistance: Maximum assistance Total Care Assistance: Maximum assistance   Functional Limitations Info  Sight, Hearing, Speech Sight Info: Adequate Hearing Info: Adequate Speech Info: Adequate    SPECIAL CARE FACTORS FREQUENCY  PT (By licensed PT), OT (By licensed OT)     PT Frequency: 5x/wk OT Frequency: 5x/wk            Contractures Contractures  Info: Not present    Additional Factors Info  Code Status, Allergies, Psychotropic Code Status Info: Full Code Allergies Info: Lactose Intolerance (Gi) Psychotropic Info: remeron 7.5mg  daily at bedtime          Current Medications (11/15/2018):  This is the current hospital active medication list Current Facility-Administered Medications  Medication Dose Route Frequency Provider Last Rate Last Dose  . acetaminophen (TYLENOL) tablet 650 mg  650 mg Oral Q6H PRN Vashti Hey, MD      . aspirin EC tablet 325 mg  325 mg Oral Daily Bonnell Public Tublu, MD   325 mg at 11/14/18 1024  . atorvastatin (LIPITOR) tablet 40 mg  40 mg Oral q1800 Bonnell Public Tublu, MD   40 mg at 11/14/18 1653  . carvedilol (COREG) tablet 12.5 mg  12.5 mg Oral BID WC Bonnell Public Tublu, MD   12.5 mg at 11/14/18 1653  . clopidogrel (PLAVIX) tablet 75 mg  75 mg Oral Daily Bonnell Public Tublu, MD   75 mg at 11/14/18 1024  . docusate sodium (COLACE) capsule 100 mg  100 mg Oral BID Bonnell Public Tublu, MD   100 mg at 11/14/18 2144  . enoxaparin (LOVENOX) injection 40 mg  40 mg Subcutaneous Q24H Bonnell Public Tublu, MD   40 mg at 11/14/18 2144  . feeding supplement (ENSURE ENLIVE) (ENSURE ENLIVE) liquid 237 mL  237 mL Oral TID BM Axel Filler, MD   237 mL at 11/14/18 2144  . levETIRAcetam (KEPPRA) tablet 250 mg  250 mg Oral BID Matilde Haymaker, MD   250 mg at 11/14/18 2144  . Melatonin TABS 3 mg  3 mg Oral QHS Bonnell Public Tublu, MD   3 mg at 11/14/18 2144  . mirtazapine (REMERON) tablet 7.5 mg  7.5 mg Oral QHS Bonnell Public Tublu, MD   7.5 mg at 11/14/18 2143  . multivitamin with minerals tablet 1 tablet  1 tablet Oral Daily Axel Filler, MD   1 tablet at 11/14/18 1024  . mupirocin ointment (BACTROBAN) 2 % 1 application  1 application Nasal BID Axel Filler, MD   1 application at 81/82/99 2144  . potassium chloride SA (K-DUR) CR tablet 40 mEq  40 mEq Oral Once Mina Marble P, DO         Discharge Medications: Please see discharge summary for a list of discharge medications.  Relevant Imaging Results:  Relevant Lab Results:   Additional  Information SSN: [id number]  Laie, LCSWA

## 2018-11-15 NOTE — Discharge Summary (Signed)
[facility] Discharge Summary  Patient name: Maurice Mckee Medical record number: [id number] Date of birth: [birth year] Age: 85 y.o. Gender: male Date of Admission: 11/12/2018  Date of Discharge: 11/18/2018 Admitting Physician: Vashti Hey, MD  Primary Care Provider: Patient, No Pcp Per Consultants: Neuro  Indication for Hospitalization: AMS, transient  Discharge Diagnoses/Problem List:  Altered mental status with waxing and waning Dementia, end-stage CODE STATUS Transient hypotension-resolved Heart failure with reduced ejection fraction, ICD CAD MDD  Disposition: Discharge to SNF  Discharge Condition: stable  Discharge Exam: General: sleepy but arousable,NAD, lying comfortably in bed CV: regular rate and rhythm without murmurs, rubs, or gallops, no lower extremity edema, 2+ radial and pedal pulses bilaterally Lungs: clear to auscultation bilaterally with normal work of breathing on RA Abdomen: soft, non-tender, non-distended, normoactive bowel sounds Skin: warm, dry Extremities: warm and well perfused  Brief Hospital Course:  Maurice Mckee is a 85 year old male who presented from SNF with AMS concerning for seizure.  Patient has a history of severe dementia with waxing and waning of mental status.  He was brought into the ED after having a staring spell as well as an episode of hypotension which resolved with IV fluids and some trouble communicating. CT head negative for acute intracranial abnormality. Neurology recommended patient be admitted to hospital to obtain an MRI and overnight EEG.  Patient was started on Keppra 250 mg twice daily empirically.  MRI was unable to be obtained given patient has AICD in place is not compatible with testing.  EEG showed generalized slowing with no epileptiform changes and neurology signed off.  Palliative care was consulted on 6/25 given patient's degree of dementia.  Family still plans for full scope  treatment.  Encourage consideration of DNR/DNI given poor outcomes in similar patients. Patient was discharged back to SNF on 11/18/2018.   Issues for Follow Up:  1. Encouraged goals of care discussion with son and family members for this gentleman who lacks capacity, has end-stage medical issues and significant comorbidities.  No significant improvement as expected in his health or mental status.  Significant Procedures: none  Significant Labs and Imaging:  Recent Labs  Lab 11/12/18 1350 11/12/18 1438 11/13/18 0633  WBC 12.5*  --  11.2*  HGB 10.9* 13.9 10.9*  HCT 35.6* 41.0 34.9*  PLT 233  --  224   Recent Labs  Lab 11/12/18 1350 11/12/18 1438 11/13/18 0633  NA 139 139 141  K 3.4* 3.6 3.7  CL 105  --  108  CO2 27  --  26  GLUCOSE 86  --  96  BUN 13  --  12  CREATININE 1.03  --  0.77  CALCIUM 8.7*  --  8.6*  MG  --   --  2.0  ALKPHOS 123  --   --   AST 21  --   --   ALT 21  --   --   ALBUMIN 2.6*  --   --     EEG: 11/13/2018 Impression:  This is an abnormal EEG.  There is evidence of mild generalized slowing of brain activity. This is non-specific but may be due to dementia, toxic, metabolic, or infectious etiologies.  If clinically indicated a repeat EEG when he is more awake and alert may be of value in the future.    EKG 11/13/2018 - NSR  Ct Head Wo Contrast Result Date: 11/12/2018 CLINICAL DATA:  Dementia altered mental status EXAM: CT HEAD WITHOUT CONTRAST TECHNIQUE: Contiguous axial  images were obtained from the base of the skull through the vertex without intravenous contrast. COMPARISON:  CT brain 10/10/2018 FINDINGS: Brain: No acute territorial infarction, hemorrhage or intracranial mass. Atrophy and small vessel ischemic changes of the white matter. Chronic infarcts within the left basal ganglia and left frontal white matter with ex vacuo dilatation of the anterior horn of the ventricle on the left. Ventricles are stable in size and morphology. Vascular: No  hyperdense vessels.  Carotid vascular calcification Skull: Normal. Negative for fracture or focal lesion. Sinuses/Orbits: Completely opacified left maxillary sinus with curvilinear calcifications as before. Patchy mucosal thickening in the ethmoid sinuses Other: None IMPRESSION: 1. No CT evidence for acute intracranial abnormality. 2. Atrophy and small vessel ischemic changes of the white matter. Electronically Signed   By: Donavan Foil M.D.   On: 11/12/2018 14:47   Dg Chest Port 1 View Result Date: 11/12/2018 CLINICAL DATA:  Altered mental status. EXAM: PORTABLE CHEST 1 VIEW COMPARISON:  None. FINDINGS: The heart size and mediastinal contours are within normal limits. Left-sided pacemaker is unchanged in position. Atherosclerosis of thoracic aorta is noted. No pneumothorax or pleural effusion is noted. Both lungs are clear. The visualized skeletal structures are unremarkable. IMPRESSION: No acute cardiopulmonary abnormality seen. Aortic Atherosclerosis (ICD10-I70.0). Electronically Signed   By: Marijo Conception M.D.   On: 11/12/2018 13:52   Results/Tests Pending at Time of Discharge: none  Discharge Medications:  Allergies as of 11/18/2018      Reactions   Lactose Intolerance (gi) Other (See Comments)   "Allergic," per [facility]      Medication List    TAKE these medications   acetaminophen 325 MG tablet Commonly known as: TYLENOL Take 650 mg by mouth every 6 (six) hours as needed (for pain or fever >101 F).   aspirin EC 325 MG tablet Take 325 mg by mouth daily.   atorvastatin 40 MG tablet Commonly known as: LIPITOR Take 40 mg by mouth daily at 6 PM.   carvedilol 12.5 MG tablet Commonly known as: COREG Take 12.5 mg by mouth 2 (two) times daily with a meal.   cholecalciferol 1000 units tablet Commonly known as: VITAMIN D Take 1,000 Units by mouth daily.   clopidogrel 75 MG tablet Commonly known as: PLAVIX Take 75 mg by mouth daily.   docusate sodium 100 MG capsule Commonly known as:  COLACE Take 1 capsule (100 mg total) by mouth 2 (two) times daily.   ferrous sulfate 325 (65 FE) MG tablet Take 325 mg by mouth daily.   furosemide 20 MG tablet Commonly known as: LASIX Take 20 mg by mouth daily.   Ensure Plus Liqd Take 237 mLs by mouth 2 (two) times daily between meals.   lactose free nutrition Liqd Take 237 mLs by mouth 2 (two) times daily between meals.   levETIRAcetam 250 MG tablet Commonly known as: KEPPRA Take 1 tablet (250 mg total) by mouth 2 (two) times daily.   Melatonin 3 MG Tabs Take 3 mg by mouth at bedtime.   mirtazapine 7.5 MG tablet Commonly known as: REMERON Take 7.5 mg by mouth at bedtime.   multivitamin with minerals Tabs tablet Take 1 tablet by mouth daily.   One-A-Day Proactive 65+ Tabs Take 1 tablet by mouth daily with breakfast.       Discharge Instructions: Please refer to Patient Instructions section of EMR for full details.  Patient was counseled important signs and symptoms that should prompt return to medical care, changes in medications, dietary  instructions, activity restrictions, and follow up appointments.   Follow-Up Appointments: Follow-up Information    PCP Follow up.   Why: Follow up with your PCP at earliest convenience          [place], [place], DO 11/18/2018, 9:12 AM PGY-`, [facility]

## 2018-11-16 DIAGNOSIS — R55 Syncope and collapse: Secondary | ICD-10-CM

## 2018-11-16 DIAGNOSIS — R451 Restlessness and agitation: Secondary | ICD-10-CM

## 2018-11-16 NOTE — Progress Notes (Signed)
Spoke with patient's son, Norris Brumbach, for an update. Patient's son was upset to find that his father was not back at the SNF. He reports that he was told on Friday by Dr. Cecille Rubin that they were preparing his father for transport back to the facility. This RN told the patient's son that his father needed to have a COVID swab performed before he returned to the facility and that the swab had been sent this afternoon. This RN also informed the son that this test may take up to 48 hrs to return. Patient's son expressed frustration that no one informed him that his father was not leaving on Friday, as he believed, nor did anyone inform him of his father needing a COVID screen. This RN did apologize for the miscommunication and gave him an update on his father's condition today. He did express concern in wanting to discuss this matter with leadership due to the financial strain this will cause his father by having to pay for both places. This RN gave the patient's son the name and phone number of the DD. Patient's son was satisfied and plans to follow-up in the am.  Dorthey Sawyer, RN

## 2018-11-16 NOTE — Progress Notes (Signed)
Family Medicine Teaching Service Daily Progress Note Intern Pager: [phone]  Patient name: Maurice Mckee Medical record number: [id number] Date of birth: [birth year] Age: 85 y.o. Gender: male  Primary Care Provider: Patient, No Pcp Per Consultants: Neuro Code Status: Full  Pt Overview and Major Events to Date:  6/24: Admitted for AMS  Assessment and Plan: Maurice Mckee a 85 y.o.malepresenting from SNF with AMS concerning for seizure. PMH is significant formultiple falls s/p left and right hip fracture on 4/16 and 5/24,recent history of pneumonia in April, advanced dementia, HFrEF (45-50%), HTN, HLD, and vitamin D deficiency.  Waxing and waning altered mental status, stable Significant waxing and waning of mental status demonstrated on 6/25 during the day.   Medically stable for discharge. - Keppra 250 twice daily started per neuro recs - Neurology has signed off  Dementia, end stage Palliative care, was able to speak with the son on 6/25.  At this time, son is requesting full medical care/work-up.  He is not interested in moving toward comfort measures at this time.  He wishes to maintain a FULL code status for his 71 year old father with significant comorbidities. - recently discontinued Memantine and donepezil at discharge on 6/2 given age - Continue talks with family regarding code status given prognosis  H/o HTN: 161/78 this am. Mildly elevated pressures o/n.  - continue to monitor - continue coreg   HFrEF (45-50%)  Pacemaker: Euvolemic on exam. -Carvedilol 12.5 mg twice daily  CAD:  -Atorvastatin 40 mg daily -ASA 325 daily -Clopidogrel 75 mg daily  Depression: -Mirtazapine 7.5 mg nightly  FEN/GI:Heart Healthy/carb modified ENI:[id number]  Disposition:  Medically stable for discharge to SNF  Subjective:  Patient is oriented to self this morning.  He states that he is not in pain or uncomfortable.  He is intermittently falling asleep during  exam.  Objective: Temp:  [97.5 F (36.4 C)-98.3 F (36.8 C)] 97.5 F (36.4 C) (06/28 0454) Pulse Rate:  [72-112] 76 (06/28 0542) Resp:  [18-20] 18 (06/28 0454) BP: (99-161)/(62-119) 161/78 (06/28 0542) SpO2:  [93 %-98 %] 97 % (06/28 0454) Physical Exam: General: NAD, pleasant, dry mucous membranes Cardiovascular: RRR, no m/r/g, no LE edema Respiratory: CTA BL, normal work of breathing Gastrointestinal: soft, nontender, nondistended Derm: no rashes appreciated  Laboratory: Recent Labs  Lab 11/12/18 1350 11/12/18 1438 11/13/18 0633  WBC 12.5*  --  11.2*  HGB 10.9* 13.9 10.9*  HCT 35.6* 41.0 34.9*  PLT 233  --  224   Recent Labs  Lab 11/12/18 1350 11/12/18 1438 11/13/18 0633  NA 139 139 141  K 3.4* 3.6 3.7  CL 105  --  108  CO2 27  --  26  BUN 13  --  12  CREATININE 1.03  --  0.77  CALCIUM 8.7*  --  8.6*  PROT 5.1*  --   --   BILITOT 0.4  --   --   ALKPHOS 123  --   --   ALT 21  --   --   AST 21  --   --   GLUCOSE 86  --  96    Imaging/Diagnostic Tests: No results found.  Evia Goldsmith, [place], DO 11/16/2018, 5:46 AM PGY-2, [place] Intern pager: [phone], text pages welcome

## 2018-11-17 DIAGNOSIS — R627 Adult failure to thrive: Secondary | ICD-10-CM

## 2018-11-17 DIAGNOSIS — N400 Enlarged prostate without lower urinary tract symptoms: Secondary | ICD-10-CM

## 2018-11-17 DIAGNOSIS — R410 Disorientation, unspecified: Secondary | ICD-10-CM

## 2018-11-17 DIAGNOSIS — I959 Hypotension, unspecified: Secondary | ICD-10-CM

## 2018-11-17 LAB — NOVEL CORONAVIRUS, NAA (HOSP ORDER, SEND-OUT TO REF LAB; TAT 18-24 HRS): SARS-CoV-2, NAA: NOT DETECTED

## 2018-11-17 NOTE — Progress Notes (Signed)
Family Medicine Teaching Service Daily Progress Note Intern Pager: [phone]  Patient name: Maurice Mckee Medical record number: [id number] Date of birth: [birth year] Age: 85 y.o. Gender: male  Primary Care Provider: Patient, No Pcp Per Consultants: Neuro Code Status: Full  Pt Overview and Major Events to Date:  6/24: Admitted for AMS  Assessment and Plan: Jakota Boydis a 85 y.o.malepresenting from SNF with AMS concerning for seizure. PMH is significant formultiple falls s/p left and right hip fracture on 4/16 and 5/24,recent history of pneumonia in April, advanced dementia, HFrEF (45-50%), HTN, HLD, and vitamin D deficiency.  Waxing and waning altered mental status, stable Significant waxing and waning of mental status demonstrated on 6/25 during the day.   Medically stable for discharge. Awaiting COVID test with plan to return to SNF. - Keppra 250 BID started per neuro recs - Neurology has signed off   Dementia, end stage Palliative care, was able to speak with the son on 6/25.  At this time, son is requesting full medical care/work-up.  He is not interested in moving toward comfort measures at this time.  He wishes to maintain a FULL code status for his 4 year old father with significant comorbidities. - recently discontinued Memantine and donepezil at discharge on 6/2 given age - Continue talks with family regarding code status given prognosis  H/o HTN: 138/82 this am. Mildly elevated blood pressures overnight. - continue to monitor - continue coreg   HFrEF (45-50%)  Pacemaker: Euvolemic on exam. -Carvedilol 12.5 mg twice daily  CAD:  -Atorvastatin 40 mg daily -ASA 325 daily -Clopidogrel 75 mg daily  Depression: -Mirtazapine 7.5 mg nightly  FEN/GI:Heart Healthy/carb modified [id number]:[id number]  Disposition:  Medically stable for discharge to SNF  Subjective:  No acute events overnight. Patient doing well this AM.  Objective: Temp:  [97.4 F (36.3  C)-97.6 F (36.4 C)] 97.6 F (36.4 C) (06/29 0953) Pulse Rate:  [62-74] 74 (06/29 0953) Resp:  [14-16] 14 (06/29 0953) BP: (124-176)/(68-91) 138/82 (06/29 0953) SpO2:  [97 %-99 %] 97 % (06/29 0953) Weight:  [59.7 kg] 59.7 kg (06/28 2212) Physical Exam: General: NAD, resting comfortably in bed  CV: regular rate and rhythm without murmurs, rubs, or gallops, no lower extremity edema, 2+ radial and pedal pulses bilaterally Lungs: clear to auscultation bilaterally with normal work of breathing Abdomen: soft, non-tender, non-distended, normoactive bowel sounds Skin: warm, dry Extremities: warm and well perfused  Laboratory: Recent Labs  Lab 11/12/18 1350 11/12/18 1438 11/13/18 0633  WBC 12.5*  --  11.2*  HGB 10.9* 13.9 10.9*  HCT 35.6* 41.0 34.9*  PLT 233  --  224   Recent Labs  Lab 11/12/18 1350 11/12/18 1438 11/13/18 0633  NA 139 139 141  K 3.4* 3.6 3.7  CL 105  --  108  CO2 27  --  26  BUN 13  --  12  CREATININE 1.03  --  0.77  CALCIUM 8.7*  --  8.6*  PROT 5.1*  --   --   BILITOT 0.4  --   --   ALKPHOS 123  --   --   ALT 21  --   --   AST 21  --   --   GLUCOSE 86  --  96    Imaging/Diagnostic Tests: No results found.  Mina Marble [place], DO 11/17/2018, 1:58 PM PGY-1, [place] Intern pager: [phone], text pages welcome

## 2018-11-17 NOTE — Clinical Social Work Note (Signed)
Patient medically stable for discharge pending receipt of COVID test results. Test is still in process. Admissions director at [facility] advised. CSW was informed earlier today by Juliann Pulse, admissions Mudlogger at [facility] that insurance authorization received. CSW will continue to follow and facilitate discharge to [facility] once results are received from [place], MSW, [place] Licensed Clinical Social Worker [place] [phone]

## 2018-11-17 NOTE — Progress Notes (Signed)
I, the nurse called the son Francee Piccolo) about patient's status. Asked the son if he had any questions about the patients care, and he Air cabin crew) didn't have any questions.   Farley Ly RN

## 2018-11-17 NOTE — Progress Notes (Signed)
Patient ID: Bracy Pepper, male   DOB: 1925/10/26, 85 y.o.   MRN: [id number]  This NP visited patient at the bedside as a follow up for Palliaitve Medicine needs and emotional support.     Patient is non-verbal and unable to follow commends, with poor po intake.   He has ES-dementia and is high risk to decompensate 2/2 to decreased po take, increased risk of infection and aspiration, and simply adult failure to thrive within the context of human mortality.  Spoke to son by telephone briefly to offer information regarding natural trajectory and expectations at EOL. Questions and concerns addressed  Discussed with son the importance of continued conversation with his  family and the  medical providers regarding overall plan of care and treatment options,  ensuring decisions are within the context of the patients values and GOCs and his best interest.  Total time spent on the unit was 25 minutes  Recommend Palliative Medicine to f/u at SNF  Greater than 50% of the time was spent in counseling and coordination of care  Wadie Lessen NP  Palliative Medicine Team Team Phone # [phone] Pager [phone]

## 2018-11-17 NOTE — Plan of Care (Signed)
  Problem: Education: Goal: Knowledge of General Education information will improve Description Including pain rating scale, medication(s)/side effects and non-pharmacologic comfort measures Outcome: Progressing   

## 2018-11-18 MED ORDER — LEVETIRACETAM 250 MG PO TABS: 250.0000 mg | ORAL_TABLET | Freq: Two times a day (BID) | ORAL | 0 refills | Status: AC

## 2018-11-18 NOTE — TOC Transition Note (Signed)
Transition of Care [facility]) - CM/SW Discharge Note 11/18/18 - PATIENT DISCHARGED TO [facility] VIA AMBULANCE   Patient Details  Name: Maurice Mckee MRN: [id number] Date of Birth: 08/26/1925  Transition of Care [facility]) CM/SW Contact:  Sable Feil, LCSW Phone Number: 11/18/2018, 5:11 PM   Clinical Narrative:  [place] admissions/hospital liaison [facility] contacted and advised that COVID test results in - Negative. Discharge clinicals transmitted to facility and ambulance transport was arranged. Son Odis Turck contacted and advised of discharge and that transport had left the hospital with patient.    Final next level of care: Skilled Nursing Facility Barriers to Discharge: No Barriers Identified   Patient Goals and CMS Choice Patient states their goals for this hospitalization and ongoing recovery are:: Per son, goals are for patient to return to [facility] and continue rehab CMS Medicare.gov Compare Post Acute Care list provided to:: Other (Comment Required)(Bot needed as patient returned to [facility]) Choice offered to / list presented to : NA  Discharge Placement - [place] number confirmed : 11/15/18            Patient to be transferred to facility by: Ambulance Name of family member notified: Hipolito Martinezlopez, son by phone [phone]) Patient and family notified of of transfer: 11/18/18  Discharge Plan and Services                                    Social Determinants of Health (SDOH) Interventions  No SDOH interventions needed prior to discharge.   Readmission Risk Interventions No flowsheet data found.

## 2018-11-18 NOTE — Plan of Care (Signed)
  Problem: Education: Goal: Knowledge of General Education information will improve Description: Including pain rating scale, medication(s)/side effects and non-pharmacologic comfort measures Outcome: Completed/Met   Problem: Health Behavior/Discharge Planning: Goal: Ability to manage health-related needs will improve Outcome: Completed/Met   Problem: Clinical Measurements: Goal: Will remain free from infection Outcome: Completed/Met Goal: Diagnostic test results will improve Outcome: Completed/Met Goal: Respiratory complications will improve Outcome: Completed/Met Goal: Cardiovascular complication will be avoided Outcome: Completed/Met   Problem: Activity: Goal: Risk for activity intolerance will decrease Outcome: Completed/Met   Problem: Nutrition: Goal: Adequate nutrition will be maintained Outcome: Completed/Met   Problem: Coping: Goal: Level of anxiety will decrease Outcome: Completed/Met   Problem: Elimination: Goal: Will not experience complications related to bowel motility Outcome: Completed/Met Goal: Will not experience complications related to urinary retention Outcome: Completed/Met   Problem: Safety: Goal: Ability to remain free from injury will improve Outcome: Completed/Met   Problem: Skin Integrity: Goal: Risk for impaired skin integrity will decrease Outcome: Completed/Met

## 2018-11-18 NOTE — Progress Notes (Signed)
OT Cancellation Note  Patient Details Name: Maurice Mckee MRN: [id number] DOB: Oct 29, 1925   Cancelled Treatment:    Reason Eval/Treat Not Completed: Other (comment)(Pt d/c) Pt d/c upon arrival for OT session. OT will sign off.   Minus Breeding, MSOT, OTR/L  [facility] Rehabilitation Services  [phone]   [name] 11/18/2018, 2:06 PM

## 2018-11-18 NOTE — Progress Notes (Signed)
Updated given to son. Patient will be discharged today.

## 2019-01-07 ENCOUNTER — Emergency Department (HOSPITAL_COMMUNITY): Payer: Medicare Other

## 2019-01-07 ENCOUNTER — Inpatient Hospital Stay (HOSPITAL_COMMUNITY)
Admission: EM | Admit: 2019-01-07 | Discharge: 2019-01-12 | DRG: 193 | Disposition: A | Discharge status: Hospice | Payer: Medicare Other | Source: Skilled Nursing Facility | Attending: Internal Medicine | Admitting: Internal Medicine

## 2019-01-07 ENCOUNTER — Other Ambulatory Visit: Payer: Self-pay

## 2019-01-07 DIAGNOSIS — J9601 Acute respiratory failure with hypoxia: Secondary | ICD-10-CM | POA: Diagnosis present

## 2019-01-07 DIAGNOSIS — Z66 Do not resuscitate: Secondary | ICD-10-CM | POA: Diagnosis not present

## 2019-01-07 DIAGNOSIS — E43 Unspecified severe protein-calorie malnutrition: Secondary | ICD-10-CM | POA: Diagnosis present

## 2019-01-07 DIAGNOSIS — Z681 Body mass index (BMI) 19 or less, adult: Secondary | ICD-10-CM

## 2019-01-07 DIAGNOSIS — I429 Cardiomyopathy, unspecified: Secondary | ICD-10-CM | POA: Diagnosis present

## 2019-01-07 DIAGNOSIS — E785 Hyperlipidemia, unspecified: Secondary | ICD-10-CM | POA: Diagnosis present

## 2019-01-07 DIAGNOSIS — M1990 Unspecified osteoarthritis, unspecified site: Secondary | ICD-10-CM | POA: Diagnosis present

## 2019-01-07 DIAGNOSIS — I2510 Atherosclerotic heart disease of native coronary artery without angina pectoris: Secondary | ICD-10-CM | POA: Diagnosis present

## 2019-01-07 DIAGNOSIS — J440 Chronic obstructive pulmonary disease with acute lower respiratory infection: Secondary | ICD-10-CM | POA: Diagnosis present

## 2019-01-07 DIAGNOSIS — F0391 Unspecified dementia with behavioral disturbance: Secondary | ICD-10-CM | POA: Diagnosis present

## 2019-01-07 DIAGNOSIS — J90 Pleural effusion, not elsewhere classified: Secondary | ICD-10-CM | POA: Diagnosis not present

## 2019-01-07 DIAGNOSIS — I480 Paroxysmal atrial fibrillation: Secondary | ICD-10-CM | POA: Diagnosis not present

## 2019-01-07 DIAGNOSIS — R4182 Altered mental status, unspecified: Secondary | ICD-10-CM | POA: Diagnosis present

## 2019-01-07 DIAGNOSIS — I5022 Chronic systolic (congestive) heart failure: Secondary | ICD-10-CM | POA: Diagnosis present

## 2019-01-07 DIAGNOSIS — Z20828 Contact with and (suspected) exposure to other viral communicable diseases: Secondary | ICD-10-CM | POA: Diagnosis present

## 2019-01-07 DIAGNOSIS — J189 Pneumonia, unspecified organism: Principal | ICD-10-CM | POA: Diagnosis present

## 2019-01-07 DIAGNOSIS — Z7902 Long term (current) use of antithrombotics/antiplatelets: Secondary | ICD-10-CM

## 2019-01-07 DIAGNOSIS — Z809 Family history of malignant neoplasm, unspecified: Secondary | ICD-10-CM

## 2019-01-07 DIAGNOSIS — N400 Enlarged prostate without lower urinary tract symptoms: Secondary | ICD-10-CM | POA: Diagnosis present

## 2019-01-07 DIAGNOSIS — I110 Hypertensive heart disease with heart failure: Secondary | ICD-10-CM | POA: Diagnosis present

## 2019-01-07 DIAGNOSIS — E739 Lactose intolerance, unspecified: Secondary | ICD-10-CM | POA: Diagnosis present

## 2019-01-07 DIAGNOSIS — D649 Anemia, unspecified: Secondary | ICD-10-CM | POA: Diagnosis present

## 2019-01-07 DIAGNOSIS — R64 Cachexia: Secondary | ICD-10-CM | POA: Diagnosis present

## 2019-01-07 DIAGNOSIS — G301 Alzheimer's disease with late onset: Secondary | ICD-10-CM | POA: Diagnosis not present

## 2019-01-07 DIAGNOSIS — Y95 Nosocomial condition: Secondary | ICD-10-CM | POA: Diagnosis present

## 2019-01-07 DIAGNOSIS — F03918 Unspecified dementia, unspecified severity, with other behavioral disturbance: Secondary | ICD-10-CM | POA: Diagnosis present

## 2019-01-07 DIAGNOSIS — J918 Pleural effusion in other conditions classified elsewhere: Secondary | ICD-10-CM

## 2019-01-07 DIAGNOSIS — Z95810 Presence of automatic (implantable) cardiac defibrillator: Secondary | ICD-10-CM | POA: Diagnosis not present

## 2019-01-07 DIAGNOSIS — F0280 Dementia in other diseases classified elsewhere without behavioral disturbance: Secondary | ICD-10-CM | POA: Diagnosis not present

## 2019-01-07 DIAGNOSIS — R627 Adult failure to thrive: Secondary | ICD-10-CM | POA: Diagnosis present

## 2019-01-07 DIAGNOSIS — I10 Essential (primary) hypertension: Secondary | ICD-10-CM | POA: Diagnosis not present

## 2019-01-07 DIAGNOSIS — Z515 Encounter for palliative care: Secondary | ICD-10-CM | POA: Diagnosis not present

## 2019-01-07 DIAGNOSIS — Z7982 Long term (current) use of aspirin: Secondary | ICD-10-CM | POA: Diagnosis not present

## 2019-01-07 DIAGNOSIS — I959 Hypotension, unspecified: Secondary | ICD-10-CM | POA: Diagnosis not present

## 2019-01-07 DIAGNOSIS — L89152 Pressure ulcer of sacral region, stage 2: Secondary | ICD-10-CM | POA: Diagnosis present

## 2019-01-07 DIAGNOSIS — F0281 Dementia in other diseases classified elsewhere with behavioral disturbance: Secondary | ICD-10-CM

## 2019-01-07 DIAGNOSIS — Z7189 Other specified counseling: Secondary | ICD-10-CM

## 2019-01-07 LAB — CBC WITH DIFFERENTIAL/PLATELET
Abs Immature Granulocytes: 0.18 10*3/uL — ABNORMAL HIGH (ref 0.00–0.07)
Basophils Absolute: 0 10*3/uL (ref 0.0–0.1)
Basophils Relative: 0 %
Eosinophils Absolute: 0.1 10*3/uL (ref 0.0–0.5)
Eosinophils Relative: 1 %
HCT: 33.2 % — ABNORMAL LOW (ref 39.0–52.0)
Hemoglobin: 10.1 g/dL — ABNORMAL LOW (ref 13.0–17.0)
Immature Granulocytes: 1 %
Lymphocytes Relative: 17 %
Lymphs Abs: 3 10*3/uL (ref 0.7–4.0)
MCH: 27.4 pg (ref 26.0–34.0)
MCHC: 30.4 g/dL (ref 30.0–36.0)
MCV: 90.2 fL (ref 80.0–100.0)
Monocytes Absolute: 1.3 10*3/uL — ABNORMAL HIGH (ref 0.1–1.0)
Monocytes Relative: 7 %
Neutro Abs: 13.1 10*3/uL — ABNORMAL HIGH (ref 1.7–7.7)
Neutrophils Relative %: 74 %
Platelets: 248 10*3/uL (ref 150–400)
RBC: 3.68 MIL/uL — ABNORMAL LOW (ref 4.22–5.81)
RDW: 16.5 % — ABNORMAL HIGH (ref 11.5–15.5)
WBC: 17.7 10*3/uL — ABNORMAL HIGH (ref 4.0–10.5)
nRBC: 0 % (ref 0.0–0.2)

## 2019-01-07 LAB — LACTIC ACID, PLASMA: Lactic Acid, Venous: 1.2 mmol/L (ref 0.5–1.9)

## 2019-01-07 LAB — COMPREHENSIVE METABOLIC PANEL
ALT: 25 U/L (ref 0–44)
AST: 18 U/L (ref 15–41)
Albumin: 2.2 g/dL — ABNORMAL LOW (ref 3.5–5.0)
Alkaline Phosphatase: 80 U/L (ref 38–126)
Anion gap: 9 (ref 5–15)
BUN: 37 mg/dL — ABNORMAL HIGH (ref 8–23)
CO2: 25 mmol/L (ref 22–32)
Calcium: 9.5 mg/dL (ref 8.9–10.3)
Chloride: 112 mmol/L — ABNORMAL HIGH (ref 98–111)
Creatinine, Ser: 0.96 mg/dL (ref 0.61–1.24)
GFR calc Af Amer: >60 mL/min (ref >60)
GFR calc non Af Amer: >60 mL/min (ref >60)
Glucose, Bld: 141 mg/dL — ABNORMAL HIGH (ref 70–99)
Potassium: 3.8 mmol/L (ref 3.5–5.1)
Sodium: 146 mmol/L — ABNORMAL HIGH (ref 135–145)
Total Bilirubin: 0.4 mg/dL (ref 0.3–1.2)
Total Protein: 5.5 g/dL — ABNORMAL LOW (ref 6.5–8.1)

## 2019-01-07 LAB — SARS CORONAVIRUS 2 BY RT PCR (HOSPITAL ORDER, PERFORMED IN ~~LOC~~ HOSPITAL LAB): SARS Coronavirus 2: NEGATIVE

## 2019-01-07 LAB — URINALYSIS, ROUTINE W REFLEX MICROSCOPIC
Bilirubin Urine: NEGATIVE
Glucose, UA: NEGATIVE mg/dL
Hgb urine dipstick: NEGATIVE
Ketones, ur: NEGATIVE mg/dL
Leukocytes,Ua: NEGATIVE
Nitrite: NEGATIVE
Protein, ur: NEGATIVE mg/dL
Specific Gravity, Urine: 1.023 (ref 1.005–1.030)
pH: 5 (ref 5.0–8.0)

## 2019-01-07 LAB — HIV ANTIBODY (ROUTINE TESTING W REFLEX): HIV Screen 4th Generation wRfx: NONREACTIVE

## 2019-01-07 LAB — MAGNESIUM: Magnesium: 1.9 mg/dL (ref 1.7–2.4)

## 2019-01-07 LAB — PROCALCITONIN: Procalcitonin: <0.1 ng/mL

## 2019-01-07 LAB — BRAIN NATRIURETIC PEPTIDE: B Natriuretic Peptide: 60.7 pg/mL (ref 0.0–100.0)

## 2019-01-07 LAB — MRSA PCR SCREENING: MRSA by PCR: POSITIVE — AB

## 2019-01-07 MED ORDER — SODIUM CHLORIDE 0.9 % IV SOLN
2.0000 g | Freq: Once | INTRAVENOUS | Status: AC
  Administered 2019-01-07: 03:00:00 2 g via INTRAVENOUS
  Filled 2019-01-07: qty 2

## 2019-01-07 MED ORDER — VANCOMYCIN HCL IN DEXTROSE 1-5 GM/200ML-% IV SOLN: 1000.0000 mg | Freq: Once | INTRAVENOUS | Status: DC

## 2019-01-07 MED ORDER — SODIUM CHLORIDE 0.9 % IV SOLN
INTRAVENOUS | Status: DC
  Administered 2019-01-07 (×2): via INTRAVENOUS

## 2019-01-07 MED ORDER — SODIUM CHLORIDE 0.9 % IV SOLN
500.0000 mg | INTRAVENOUS | Status: DC
  Administered 2019-01-07 – 2019-01-10 (×4): 500 mg via INTRAVENOUS
  Filled 2019-01-07 (×4): qty 500

## 2019-01-07 MED ORDER — CARVEDILOL 12.5 MG PO TABS
12.5000 mg | ORAL_TABLET | Freq: Two times a day (BID) | ORAL | Status: DC
  Administered 2019-01-07 – 2019-01-12 (×8): 12.5 mg via ORAL
  Filled 2019-01-07 (×9): qty 1

## 2019-01-07 MED ORDER — LEVETIRACETAM 250 MG PO TABS: 250.0000 mg | ORAL_TABLET | Freq: Two times a day (BID) | ORAL | Status: DC

## 2019-01-07 MED ORDER — VITAMIN D 25 MCG (1000 UNIT) PO TABS
1000.0000 [IU] | ORAL_TABLET | Freq: Every day | ORAL | Status: DC
  Administered 2019-01-08: 1000 [IU] via ORAL
  Filled 2019-01-07 (×2): qty 1

## 2019-01-07 MED ORDER — ENOXAPARIN SODIUM 40 MG/0.4ML ~~LOC~~ SOLN
40.0000 mg | SUBCUTANEOUS | Status: DC
  Administered 2019-01-08 – 2019-01-09 (×2): 40 mg via SUBCUTANEOUS
  Filled 2019-01-07 (×2): qty 0.4

## 2019-01-07 MED ORDER — VANCOMYCIN HCL 10 G IV SOLR
1250.0000 mg | Freq: Once | INTRAVENOUS | Status: AC
  Administered 2019-01-07: 1250 mg via INTRAVENOUS
  Filled 2019-01-07: qty 1250

## 2019-01-07 MED ORDER — COLLAGENASE 250 UNIT/GM EX OINT
TOPICAL_OINTMENT | Freq: Every day | CUTANEOUS | Status: DC
  Administered 2019-01-07 – 2019-01-11 (×5): via TOPICAL
  Filled 2019-01-07: qty 30

## 2019-01-07 MED ORDER — MIRTAZAPINE 7.5 MG PO TABS
7.5000 mg | ORAL_TABLET | Freq: Every day | ORAL | Status: DC
  Administered 2019-01-07 – 2019-01-11 (×4): 7.5 mg via ORAL
  Filled 2019-01-07 (×6): qty 1

## 2019-01-07 MED ORDER — SODIUM CHLORIDE 0.9 % IV BOLUS
1000.0000 mL | Freq: Once | INTRAVENOUS | Status: AC
  Administered 2019-01-07: 03:00:00 1000 mL via INTRAVENOUS

## 2019-01-07 MED ORDER — ASPIRIN EC 325 MG PO TBEC
325.0000 mg | DELAYED_RELEASE_TABLET | Freq: Every day | ORAL | Status: DC
  Administered 2019-01-08: 325 mg via ORAL
  Filled 2019-01-07 (×2): qty 1

## 2019-01-07 MED ORDER — ADULT MULTIVITAMIN W/MINERALS CH
1.0000 | ORAL_TABLET | Freq: Every day | ORAL | Status: DC
  Administered 2019-01-08: 1 via ORAL
  Filled 2019-01-07 (×3): qty 1

## 2019-01-07 MED ORDER — SODIUM CHLORIDE 0.9 % IV SOLN
250.0000 mg | Freq: Two times a day (BID) | INTRAVENOUS | Status: DC
  Administered 2019-01-07 – 2019-01-09 (×4): 250 mg via INTRAVENOUS
  Filled 2019-01-07 (×6): qty 2.5

## 2019-01-07 MED ORDER — ATORVASTATIN CALCIUM 40 MG PO TABS
40.0000 mg | ORAL_TABLET | Freq: Every day | ORAL | Status: DC
  Administered 2019-01-07 – 2019-01-08 (×2): 40 mg via ORAL
  Filled 2019-01-07 (×2): qty 1

## 2019-01-07 MED ORDER — DOCUSATE SODIUM 100 MG PO CAPS
100.0000 mg | ORAL_CAPSULE | Freq: Two times a day (BID) | ORAL | Status: DC
  Administered 2019-01-07: 22:00:00 100 mg via ORAL
  Filled 2019-01-07: qty 1

## 2019-01-07 MED ORDER — CLOPIDOGREL BISULFATE 75 MG PO TABS
75.0000 mg | ORAL_TABLET | Freq: Every day | ORAL | Status: DC
  Administered 2019-01-08: 75 mg via ORAL
  Filled 2019-01-07 (×2): qty 1

## 2019-01-07 MED ORDER — ENSURE MAX PROTEIN PO LIQD
237.0000 mL | Freq: Two times a day (BID) | ORAL | Status: DC
  Administered 2019-01-07 – 2019-01-08 (×2): 237 mL via ORAL
  Filled 2019-01-07 (×4): qty 330

## 2019-01-07 MED ORDER — SODIUM CHLORIDE 0.9 % IV SOLN
2.0000 g | INTRAVENOUS | Status: DC
  Administered 2019-01-07 – 2019-01-09 (×3): 2 g via INTRAVENOUS
  Filled 2019-01-07 (×3): qty 20

## 2019-01-07 MED ORDER — BOOST PLUS PO LIQD
237.0000 mL | Freq: Two times a day (BID) | ORAL | Status: DC
  Administered 2019-01-08: 08:00:00 237 mL via ORAL
  Filled 2019-01-07 (×3): qty 237

## 2019-01-07 MED ORDER — MELATONIN 3 MG PO TABS
3.0000 mg | ORAL_TABLET | Freq: Every day | ORAL | Status: DC
  Administered 2019-01-07 – 2019-01-11 (×4): 3 mg via ORAL
  Filled 2019-01-07 (×6): qty 1

## 2019-01-07 MED ORDER — VANCOMYCIN HCL 10 G IV SOLR
1250.0000 mg | INTRAVENOUS | Status: DC
  Administered 2019-01-08 – 2019-01-09 (×2): 1250 mg via INTRAVENOUS
  Filled 2019-01-07 (×2): qty 1250

## 2019-01-07 MED ORDER — FERROUS SULFATE 325 (65 FE) MG PO TABS
325.0000 mg | ORAL_TABLET | Freq: Every day | ORAL | Status: DC
  Administered 2019-01-08 – 2019-01-09 (×2): 325 mg via ORAL
  Filled 2019-01-07 (×2): qty 1

## 2019-01-07 NOTE — Consult Note (Signed)
[place] Nurse wound consult note Reason for Consult: Unstageable pressure injury to coccyx Wound type: Pressure Pressure Injury POA: Yes Measurement:2cm x 1cm with depth obscured due to the presence of yellow firmly adherent nonviable tissue Wound bed:as described above Drainage (amount, consistency, odor) scant to small amount of light yellow exudate consistent with autolytically debriding yellow slough Periwound: blanching erythema, slightly discolored (deeper hue than skin tone) Dressing procedure/placement/frequency:I will provide Nursing with guidance for the care of the wound using collagenase (Santyl) enzymatic debriding ointment topped with a pressure redistributing silicone foam dressing for the sacrum.  Guidance has been provided for positioning off of the sacrum byu turning from side to side.  The patient's feet are dry and vulnerable; I am providing today bilateral pressure redistribution heel boots.  Timely incontinence care is being provided.  He is wearing an incontinence brief.  If admitted to floor, briefs will be discontinued.  A photo has been requested from the provider.  [facility] nursing team will not follow, but will remain available to this patient, the nursing and medical teams.  Please re-consult if needed. Thanks, Maudie Flakes, MSN, RN, [place], Arther Abbott  Pager# [phone]

## 2019-01-07 NOTE — ED Notes (Signed)
ED TO INPATIENT HANDOFF REPORT  ED Nurse Name and Phone #: Davene Costain [phone]  S Name/Age/Gender Maurice Mckee 85 y.o. male Room/Bed: 039C/039C  Code Status   Code Status: Full Code  Home/SNF/Other Nursing Home   Triage Complete: Triage complete  Chief Complaint ams  Triage Note Pt BIB EMS from [facility]. Nurse tech was doing rounds and found patient to be reaching out with his arms and got concerned. Upon arrival EMS found pt vital signs to be 100/70, 106 HR, 95 %, 24 RR.   Allergies Allergies  Allergen Reactions  . Lactose Intolerance (Gi) Other (See Comments)    "Allergic," per MAR    Level of Care/Admitting Diagnosis ED Disposition    ED Disposition Condition [place]: [place] [100100]  Level of Care: Telemetry Medical [104]  Covid Evaluation: Asymptomatic Screening Protocol (No Symptoms)  Diagnosis: HCAP (healthcare-associated pneumonia) [563875]  Admitting Physician: Doreatha Massed  Attending Physician: Etta Quill [phone]  Estimated length of stay: past midnight tomorrow  Certification:: I certify this patient will need inpatient services for at least 2 midnights  PT Class (Do Not Modify): Inpatient [101]  PT Acc Code (Do Not Modify): Private [1]       B Medical/Surgery History Past Medical History:  Diagnosis Date  . Arthritis   . Cancer ([place])   . Cardiomyopathy ([place])   . CHF (congestive heart failure) ([place])   . COPD (chronic obstructive pulmonary disease) ([place])   . Coronary artery disease   . Dementia ([place])   . Hyperlipidemia   . Hypertension    Past Surgical History:  Procedure Laterality Date  . CARDIAC PACEMAKER PLACEMENT    . INTRAMEDULLARY (IM) NAIL INTERTROCHANTERIC Right 10/12/2018   Procedure: INTRAMEDULLARY (IM) NAIL INTERTROCHANTRIC;  Surgeon: Nicholes Stairs, MD;  Location: [place];  Service: Orthopedics;  Laterality: Right;     A IV Location/Drains/Wounds Patient  Lines/Drains/Airways Status   Active Line/Drains/Airways    Name:   Placement date:   Placement time:   Site:   Days:   Peripheral IV 01/07/19 Right Antecubital   01/07/19    0904    Antecubital   less than 1   External Urinary Catheter   10/12/18    1400    -   87   External Urinary Catheter   01/07/19    0435    -   less than 1   Incision (Closed) 10/12/18 Hip   10/12/18    1155     87   Pressure Injury 10/10/18 Stage II -  Partial thickness loss of dermis presenting as a shallow open ulcer with a red, pink wound bed without slough. One inch open area to sacrum that has a scab.  Area around sacrum is red with purple tent   10/10/18    2030     89   Pressure Injury 11/12/18 Buttocks Left Stage II -  Partial thickness loss of dermis presenting as a shallow open ulcer with a red, pink wound bed without slough.   11/12/18    1850     56          Intake/Output Last 24 hours  Intake/Output Summary (Last 24 hours) at 01/07/2019 1119 Last data filed at 01/07/2019 0531 Gross per 24 hour  Intake 1101.62 ml  Output -  Net 1101.62 ml    Labs/Imaging Results for orders placed or performed during the hospital encounter of 01/07/19 (  from the past 48 hour(s))  CBC with Differential     Status: Abnormal   Collection Time: 01/07/19  1:11 AM  Result Value Ref Range   WBC 17.7 (H) 4.0 - 10.5 K/uL   RBC 3.68 (L) 4.22 - 5.81 MIL/uL   Hemoglobin 10.1 (L) 13.0 - 17.0 g/dL   HCT 33.2 (L) 39.0 - 52.0 %   MCV 90.2 80.0 - 100.0 fL   MCH 27.4 26.0 - 34.0 pg   MCHC 30.4 30.0 - 36.0 g/dL   RDW 16.5 (H) 11.5 - 15.5 %   Platelets 248 150 - 400 K/uL   nRBC 0.0 0.0 - 0.2 %   Neutrophils Relative % 74 %   Neutro Abs 13.1 (H) 1.7 - 7.7 K/uL   Lymphocytes Relative 17 %   Lymphs Abs 3.0 0.7 - 4.0 K/uL   Monocytes Relative 7 %   Monocytes Absolute 1.3 (H) 0.1 - 1.0 K/uL   Eosinophils Relative 1 %   Eosinophils Absolute 0.1 0.0 - 0.5 K/uL   Basophils Relative 0 %   Basophils Absolute 0.0 0.0 - 0.1 K/uL    Immature Granulocytes 1 %   Abs Immature Granulocytes 0.18 (H) 0.00 - 0.07 K/uL    Comment: Performed at [facility], [address] [place], [place] [address]  Comprehensive metabolic panel     Status: Abnormal   Collection Time: 01/07/19  1:11 AM  Result Value Ref Range   Sodium 146 (H) 135 - 145 mmol/L   Potassium 3.8 3.5 - 5.1 mmol/L   Chloride 112 (H) 98 - 111 mmol/L   CO2 25 22 - 32 mmol/L   Glucose, Bld 141 (H) 70 - 99 mg/dL   BUN 37 (H) 8 - 23 mg/dL   Creatinine, Ser 0.96 0.61 - 1.24 mg/dL   Calcium 9.5 8.9 - 10.3 mg/dL   Total Protein 5.5 (L) 6.5 - 8.1 g/dL   Albumin 2.2 (L) 3.5 - 5.0 g/dL   AST 18 15 - 41 U/L   ALT 25 0 - 44 U/L   Alkaline Phosphatase 80 38 - 126 U/L   Total Bilirubin 0.4 0.3 - 1.2 mg/dL   GFR calc non Af Amer >60 >60 mL/min   GFR calc Af Amer >60 >60 mL/min   Anion gap 9 5 - 15    Comment: Performed at [facility], [place] [address], [place], [place] [address]  Magnesium     Status: None   Collection Time: 01/07/19  1:11 AM  Result Value Ref Range   Magnesium 1.9 1.7 - 2.4 mg/dL    Comment: Performed at [facility] [address] [place], [place] [address]  Lactic acid, plasma     Status: None   Collection Time: 01/07/19  2:30 AM  Result Value Ref Range   Lactic Acid, Venous 1.2 0.5 - 1.9 mmol/L    Comment: Performed at [facility] [address], [place], [place] [address]  Urinalysis, Routine w reflex microscopic     Status: None   Collection Time: 01/07/19  2:45 AM  Result Value Ref Range   Color, Urine YELLOW YELLOW   APPearance CLEAR CLEAR   Specific Gravity, Urine 1.023 1.005 - 1.030   pH 5.0 5.0 - 8.0   Glucose, UA NEGATIVE NEGATIVE mg/dL   Hgb urine dipstick NEGATIVE NEGATIVE   Bilirubin Urine NEGATIVE NEGATIVE   Ketones, ur NEGATIVE NEGATIVE mg/dL   Protein, ur NEGATIVE NEGATIVE mg/dL  Nitrite NEGATIVE NEGATIVE   Leukocytes,Ua NEGATIVE NEGATIVE    Comment: Performed at [facility], [place]  [address] [place], [place] [address]  SARS Coronavirus 2 [facility] order, Performed in [facility] hospital lab) Nasopharyngeal     Status: None   Collection Time: 01/07/19  2:47 AM   Specimen: Nasopharyngeal  Result Value Ref Range   SARS Coronavirus 2 NEGATIVE NEGATIVE    Comment: (NOTE) If result is NEGATIVE SARS-CoV-2 target nucleic acids are NOT DETECTED. The SARS-CoV-2 RNA is generally detectable in upper and lower  respiratory specimens during the acute phase of infection. The lowest  concentration of SARS-CoV-2 viral copies this assay can detect is 250  copies / mL. A negative result does not preclude SARS-CoV-2 infection  and should not be used as the sole basis for treatment or other  patient management decisions.  A negative result may occur with  improper specimen collection / handling, submission of specimen other  than nasopharyngeal swab, presence of viral mutation(s) within the  areas targeted by this assay, and inadequate number of viral copies  (<250 copies / mL). A negative result must be combined with clinical  observations, patient history, and epidemiological information. If result is POSITIVE SARS-CoV-2 target nucleic acids are DETECTED. The SARS-CoV-2 RNA is generally detectable in upper and lower  respiratory specimens dur ing the acute phase of infection.  Positive  results are indicative of active infection with SARS-CoV-2.  Clinical  correlation with patient history and other diagnostic information is  necessary to determine patient infection status.  Positive results do  not rule out bacterial infection or co-infection with other viruses. If result is PRESUMPTIVE POSTIVE SARS-CoV-2 nucleic acids MAY BE PRESENT.   A presumptive positive result was obtained on the submitted specimen  and confirmed on repeat testing.  While 2019 novel coronavirus  (SARS-CoV-2) nucleic acids may be present in the submitted sample  additional confirmatory testing may be necessary  for epidemiological  and / or clinical management purposes  to differentiate between  SARS-CoV-2 and other Sarbecovirus currently known to infect humans.  If clinically indicated additional testing with an alternate test  methodology [phone]) is advised. The SARS-CoV-2 RNA is generally  detectable in upper and lower respiratory sp ecimens during the acute  phase of infection. The expected result is Negative. Fact Sheet for Patients:  StrictlyIdeas.no Fact Sheet for Healthcare Providers: BankingDealers.co.za This test is not yet approved or cleared by the [place] FDA and has been authorized for detection and/or diagnosis of SARS-CoV-2 by FDA under an Emergency Use Authorization (EUA).  This EUA will remain in effect (meaning this test can be used) for the duration of the COVID-19 declaration under Section 564(b)(1) of the Act, 21 U.S.C. section 360bbb-3(b)(1), unless the authorization is terminated or revoked sooner. Performed at [facility], [place] [address] [place], [place] [address]   MRSA PCR Screening     Status: Abnormal   Collection Time: 01/07/19  3:35 AM   Specimen: Nasal Mucosa; Nasopharyngeal  Result Value Ref Range   MRSA by PCR POSITIVE (A) NEGATIVE    Comment:        The GeneXpert MRSA Assay (FDA approved for NASAL specimens only), is one component of a comprehensive MRSA colonization surveillance program. It is not intended to diagnose MRSA infection nor to guide or monitor treatment for MRSA infections. RESULT CALLED TO, READ BACK BY AND VERIFIED WITH: [facility] RN [id number] 01/07/2019 MITCHELL,L Performed at [facility], [address] [place], [place] [address]   Brain natriuretic peptide     Status: None   Collection Time: 01/07/19  4:40 AM  Result Value Ref Range   B Natriuretic Peptide 60.7 0.0 - 100.0 pg/mL    Comment: Performed at [facility] [address], [place],  [place] [address]  Procalcitonin - Baseline     Status: None   Collection Time: 01/07/19  4:40 AM  Result Value Ref Range   Procalcitonin <0.10 ng/mL    Comment:        Interpretation: PCT (Procalcitonin) <= 0.5 ng/mL: Systemic infection (sepsis) is not likely. Local bacterial infection is possible. (NOTE)       Sepsis PCT Algorithm           Lower Respiratory Tract                                      Infection PCT Algorithm    ----------------------------     ----------------------------         PCT < 0.25 ng/mL                PCT < 0.10 ng/mL         Strongly encourage             Strongly discourage   discontinuation of antibiotics    initiation of antibiotics    ----------------------------     -----------------------------       PCT 0.25 - 0.50 ng/mL            PCT 0.10 - 0.25 ng/mL               OR       >80% decrease in PCT            Discourage initiation of                                            antibiotics      Encourage discontinuation           of antibiotics    ----------------------------     -----------------------------         PCT >= 0.50 ng/mL              PCT 0.26 - 0.50 ng/mL               AND        <80% decrease in PCT             Encourage initiation of                                             antibiotics       Encourage continuation           of antibiotics    ----------------------------     -----------------------------        PCT >= 0.50 ng/mL                  PCT > 0.50 ng/mL               AND         increase  in PCT                  Strongly encourage                                      initiation of antibiotics    Strongly encourage escalation           of antibiotics                                     -----------------------------                                           PCT <= 0.25 ng/mL                                                 OR                                        > 80% decrease in PCT                                      Discontinue / Do not initiate                                             antibiotics Performed at [facility], [address] [place],  [address]    Dg Chest Port 1 View  Result Date: 01/07/2019 CLINICAL DATA:  Altered mental status EXAM: PORTABLE CHEST 1 VIEW COMPARISON:  Numerous radiographs most recently November 12, 2018, CT 04/11/2018 FINDINGS: New moderate right pleural effusion with thickening laterally, likely tracking into the minor fissure. There is persistent collapse of the right middle lobe. More focal patchy airspace disease is remaining aerated portions of the right lower lobe. Pacer/defibrillator pack overlies the left chest wall with leads in the right atrium and cardiac apex. The aorta is calcified and tortuous. The remaining cardiomediastinal contours are unchanged from prior exam including numerous upper abdominal and mediastinal surgical clips. No acute osseous or soft tissue abnormality. IMPRESSION: New airspace disease in the right lower lung with moderate right pleural effusion tracking into minor fissure. Suspect persistent subtotal collapse of the right middle lobe Electronically Signed   By: Lovena Le M.D.   On: 01/07/2019 01:35    Pending Labs Unresulted Labs (From admission, onward)    Start     Ordered   01/07/19 0315  Sputum Culture  (Non-severe pneumonia (non-ICU care) in adult without resistant organism risk factors.)  Once,   R     01/07/19 0317   01/07/19 0309  HIV antibody (Routine Screening)  Once,   STAT     01/07/19 0317   01/07/19 0157  Culture, blood (Routine X 2) w Reflex to ID Panel  BLOOD  CULTURE X 2,   STAT     01/07/19 0156          Vitals/Pain Today's Vitals   01/07/19 0900 01/07/19 0930 01/07/19 1000 01/07/19 1030  BP: 121/78 105/74 (!) 129/92 127/87  Pulse: 80 83 70 77  Resp: 13 13 13 12   Temp:      TempSrc:      SpO2: 99% 99% 96% 97%  PainSc:        Isolation Precautions No active  isolations  Medications Medications  enoxaparin (LOVENOX) injection 40 mg (has no administration in time range)  cefTRIAXone (ROCEPHIN) 2 g in sodium chloride 0.9 % 100 mL IVPB (has no administration in time range)  azithromycin (ZITHROMAX) 500 mg in sodium chloride 0.9 % 250 mL IVPB (0 mg Intravenous Stopped 01/07/19 0531)  0.9 %  sodium chloride infusion ( Intravenous New Bag/Given 01/07/19 0427)  aspirin EC tablet 325 mg (325 mg Oral Not Given 01/07/19 1113)  atorvastatin (LIPITOR) tablet 40 mg (has no administration in time range)  carvedilol (COREG) tablet 12.5 mg (12.5 mg Oral Not Given 01/07/19 0903)  clopidogrel (PLAVIX) tablet 75 mg (75 mg Oral Not Given 01/07/19 1114)  ferrous sulfate tablet 325 mg (325 mg Oral Not Given 01/07/19 1114)  Ensure Plus liquid 237 mL (237 mLs Oral Not Given 01/07/19 1114)  levETIRAcetam (KEPPRA) tablet 250 mg (250 mg Oral Not Given 01/07/19 1114)  lactose free nutrition (BOOST PLUS) liquid 237 mL (237 mLs Oral Not Given 01/07/19 1114)  mirtazapine (REMERON) tablet 7.5 mg (has no administration in time range)  Melatonin TABS 3 mg (has no administration in time range)  One-A-Day Proactive 65+ TABS 1 tablet (1 tablet Oral Not Given 01/07/19 0903)  docusate sodium (COLACE) capsule 100 mg (100 mg Oral Not Given 01/07/19 1115)  cholecalciferol (VITAMIN D) tablet 1,000 Units (1,000 Units Oral Not Given 01/07/19 1115)  vancomycin (VANCOCIN) 1,250 mg in sodium chloride 0.9 % 250 mL IVPB (has no administration in time range)  collagenase (SANTYL) ointment (has no administration in time range)  ceFEPIme (MAXIPIME) 2 g in sodium chloride 0.9 % 100 mL IVPB (0 g Intravenous Stopped 01/07/19 0319)  vancomycin (VANCOCIN) 1,250 mg in sodium chloride 0.9 % 250 mL IVPB (0 mg Intravenous Stopped 01/07/19 0415)  sodium chloride 0.9 % bolus 1,000 mL (0 mLs Intravenous Stopped 01/07/19 0425)    Mobility non-ambulatory     Focused Assessments Pulmonary Assessment Handoff:  Lung  sounds: Bilateral Breath Sounds: Clear, Diminished O2 Device: Room Air        R Recommendations: See Admitting Provider Note  Report given to:   Additional Notes:

## 2019-01-07 NOTE — Evaluation (Signed)
Clinical/Bedside Swallow Evaluation Patient Details  Name: Petr Bontempo MRN: [id number] Date of Birth: July 02, 1925  Today's Date: 01/07/2019 Time: SLP Start Time (ACUTE ONLY): 1255 SLP Stop Time (ACUTE ONLY): 1313 SLP Time Calculation (min) (ACUTE ONLY): 18 min  Past Medical History:  Past Medical History:  Diagnosis Date  . Arthritis   . Cancer ([place])   . Cardiomyopathy ([place])   . CHF (congestive heart failure) ([place])   . COPD (chronic obstructive pulmonary disease) ([place])   . Coronary artery disease   . Dementia ([place])   . Hyperlipidemia   . Hypertension    Past Surgical History:  Past Surgical History:  Procedure Laterality Date  . CARDIAC PACEMAKER PLACEMENT    . INTRAMEDULLARY (IM) NAIL INTERTROCHANTERIC Right 10/12/2018   Procedure: INTRAMEDULLARY (IM) NAIL INTERTROCHANTRIC;  Surgeon: Nicholes Stairs, MD;  Location: [place];  Service: Orthopedics;  Laterality: Right;   HPI:  85 y.o. male with medical history significant for end stage dementia, possible seizure and was started on Keppra, HTN, CAD, CHF EF 45-50%, AICD placement, presented for [facility] SNF with tachycardia and hypoxia.  Dx HCAP, FTT.    Assessment / Plan / Recommendation Clinical Impression  Pt arousable for limited participation in clinical swallow evaluation. Unable to follow commands; no focal CN deficits.  He did accept sips of water from a straw and consumed 4-5 bites of applesauce before he said "that's enough." He had further thin liquids with total assistance, showing no overt s/s of aspiration. There was adequate oral recognition and anticipation; brisk swallow response; no residue post swallow.  Recommend allowing purees, thin liquids; crush meds in puree.  Please provide careful hand-feeding and position pt in bed as upright as possible.  D/W RN.  SLP will follow for safety/diet progression if pt able.  SLP Visit Diagnosis: Dysphagia, unspecified (R13.10)    Aspiration Risk       Diet  Recommendation   dysphagia 1, thin liquids  Medication Administration: Crushed with puree    Other  Recommendations Oral Care Recommendations: Oral care BID   Follow up Recommendations Other (comment)(tba)      Frequency and Duration min 2x/week  1 week       Prognosis        Swallow Study   General Date of Onset: 01/06/19 HPI: 85 y.o. male with medical history significant for end stage dementia, possible seizure and was started on Keppra, HTN, CAD, CHF EF 45-50%, AICD placement, presented for [facility] SNF with tachycardia and hypoxia.  Dx HCAP, FTT.  Type of Study: Bedside Swallow Evaluation Previous Swallow Assessment: none per records Diet Prior to this Study: NPO Temperature Spikes Noted: No Respiratory Status: Room air History of Recent Intubation: No Behavior/Cognition: Confused;Lethargic/Drowsy Oral Cavity Assessment: Other (comment)(difficult to assess) Oral Care Completed by SLP: No Oral Cavity - Dentition: Edentulous Self-Feeding Abilities: Needs assist Patient Positioning: Upright in bed Baseline Vocal Quality: Normal Volitional Cough: Cognitively unable to elicit Volitional Swallow: Unable to elicit    Oral/Motor/Sensory Function Overall Oral Motor/Sensory Function: Other (comment)(symmetric at rest)   Ice Chips Ice chips: Not tested   Thin Liquid Thin Liquid: Within functional limits Presentation: Straw    Nectar Thick Nectar Thick Liquid: Not tested   Honey Thick Honey Thick Liquid: Not tested   Puree Puree: Within functional limits Presentation: Spoon   Solid     Solid: Not tested      Juan Quam Laurice 01/07/2019,1:13 PM  Estill Bamberg L. Joniya Boberg, MA CCC/SLP Acute  Rehabilitation Services Office number [phone] Pager [phone]

## 2019-01-07 NOTE — ED Notes (Signed)
Interrogated Pacemaker

## 2019-01-07 NOTE — ED Notes (Signed)
[place] pts son would like an update on pt soon as possible

## 2019-01-07 NOTE — ED Triage Notes (Signed)
Pt BIB EMS from [facility]. Nurse tech was doing rounds and found patient to be reaching out with his arms and got concerned. Upon arrival EMS found pt vital signs to be 100/70, 106 HR, 95 %, 24 RR.

## 2019-01-07 NOTE — Progress Notes (Signed)
Pharmacy Antibiotic Note  Maurice Mckee is a 85 y.o. male admitted on 01/07/2019 with tachycardia and SOB. Patient start on empiric broad-spectrum antibiotics for possible pneumonia. SCr wnl.    Plan: -Vancomycin 1250 mg IV q24h -Ceftriaxone 2 g I V q24h -Monitor renal fx, cultures, vancomycin levels as needed    Temp (24hrs), Avg:97.7 F (36.5 C), Min:97.7 F (36.5 C), Max:97.7 F (36.5 C)  Recent Labs  Lab 01/07/19 0111 01/07/19 0230  WBC 17.7*  --   CREATININE 0.96  --   LATICACIDVEN  --  1.2    CrCl cannot be calculated (Unknown ideal weight.).    Allergies  Allergen Reactions  . Lactose Intolerance (Gi) Other (See Comments)    "Allergic," per MAR    Antimicrobials this admission: 8/19 azithromycin > 8/19 cefepime > 8/19 vancomycin >  Dose adjustments this admission: N/A  Microbiology results: 8/19 mrsa pcr: pos 8/19 blood cx: 8/19 Covid-19: neg  Maurice Mckee, Maurice Mckee 01/07/2019 6:35 AM

## 2019-01-07 NOTE — H&P (Signed)
History and Physical    Maurice Mckee RWE:[id number] DOB: 01-01-1926 DOA: 01/07/2019  PCP: Patient, No Pcp Per  Patient coming from: SNF  I have personally briefly reviewed patient's old medical records in [place]  Chief Complaint: SOB  HPI: Maurice Mckee is a 85 y.o. male with medical history significant of end stage dementia, HTN, CAD, CHF EF 45-50%, AICD placement.  Patient presents to the ED from [facility].  Nurse at the facility states that the patient was noted to have a heart rate in the 200s with sats in the 80s.  He has no history of chronic oxygen requirement.  The nurse that EDP spoke with is unsure of why his vitals were checked in the first place.  Patient with stable vital signs on EMS arrival.  He had been placed on 2 L oxygen via nasal cannula for comfort prior to transport.  Seems that patient has been being treated for PNA for the past week or so with ABx.   ED Course: WBC 17k, CXR shows RLL PNA with moderate R pleural effusion.  BNP slightly elevated at 37, sodium 146.  Patient started on cefepime and vanc for PNA with peri pneumonic effusion, has h/o MRSA+ PCR in June.  Other history includes hip fx in May, with subsequent PTX likely due to trauma during surgery requiring CT placement back in May.  In June patient was admitted for AMS concerning for possible seizure and started on Keppra.   Review of Systems: Unable to obtain secondary to patient AMS  Past Medical History:  Diagnosis Date  . Arthritis   . Cancer ([place])   . Cardiomyopathy ([place])   . CHF (congestive heart failure) ([place])   . COPD (chronic obstructive pulmonary disease) ([place])   . Coronary artery disease   . Dementia ([place])   . Hyperlipidemia   . Hypertension     Past Surgical History:  Procedure Laterality Date  . CARDIAC PACEMAKER PLACEMENT    . INTRAMEDULLARY (IM) NAIL INTERTROCHANTERIC Right 10/12/2018   Procedure: INTRAMEDULLARY (IM) NAIL INTERTROCHANTRIC;  Surgeon:  Nicholes Stairs, MD;  Location: [place];  Service: Orthopedics;  Laterality: Right;     reports that he has never smoked. He has never used smokeless tobacco. He reports that he does not drink alcohol or use drugs.  Allergies  Allergen Reactions  . Lactose Intolerance (Gi) Other (See Comments)    "Allergic," per [facility]    Family History  Problem Relation Age of Onset  . Cancer Mother      Prior to Admission medications   Medication Sig Start Date End Date Taking? Authorizing Provider  acetaminophen (TYLENOL) 325 MG tablet Take 650 mg by mouth every 6 (six) hours as needed (for pain or fever >101 F).     [provider]  aspirin EC 325 MG tablet Take 325 mg by mouth daily.    [provider]  atorvastatin (LIPITOR) 40 MG tablet Take 40 mg by mouth daily at 6 PM.  11/09/16   [provider]  carvedilol (COREG) 12.5 MG tablet Take 12.5 mg by mouth 2 (two) times daily with a meal.  11/09/16   [provider]  cholecalciferol (VITAMIN D) 1000 units tablet Take 1,000 Units by mouth daily.    [provider]  clopidogrel (PLAVIX) 75 MG tablet Take 75 mg by mouth daily.  11/09/16   [provider]  docusate sodium (COLACE) 100 MG capsule Take 1 capsule (100 mg total) by  mouth 2 (two) times daily. 10/21/18   Meccariello, Bernita Raisin, DO  Ensure Plus (ENSURE PLUS) LIQD Take 237 mLs by mouth 2 (two) times daily between meals.    [provider]  ferrous sulfate 325 (65 FE) MG tablet Take 325 mg by mouth daily.    [provider]  furosemide (LASIX) 20 MG tablet Take 20 mg by mouth daily.     [provider]  lactose free nutrition (BOOST PLUS) LIQD Take 237 mLs by mouth 2 (two) times daily between meals. Patient not taking: Reported on 11/12/2018 10/21/18   Meccariello, Bernita Raisin, DO  levETIRAcetam (KEPPRA) 250 MG tablet Take 1 tablet (250 mg total) by mouth 2 (two) times daily. 11/18/18   Mullis, Kiersten P, DO  Melatonin 3 MG  TABS Take 3 mg by mouth at bedtime.    [provider]  mirtazapine (REMERON) 7.5 MG tablet Take 7.5 mg by mouth at bedtime.    [provider]  Multiple Vitamin (MULTIVITAMIN WITH MINERALS) TABS tablet Take 1 tablet by mouth daily. Patient not taking: Reported on 11/12/2018 10/22/18   Meccariello, Bernita Raisin, DO  Multiple Vitamins-Minerals (ONE-A-DAY PROACTIVE 65+) TABS Take 1 tablet by mouth daily with breakfast.    [provider]    Physical Exam: Vitals:   01/07/19 0026 01/07/19 0030 01/07/19 0200 01/07/19 0300  BP: 132/85 120/65 129/88 137/70  Pulse: 88 94 95 63  Resp: 16 (!) 24 12 (!) 23  Temp: 97.7 F (36.5 C)     TempSrc: Oral     SpO2: 94% 93% 93% 100%    Constitutional: Frail, cachectic, NAD Eyes: PERRL, lids and conjunctivae normal ENMT: Mucous membranes are moist. Posterior pharynx clear of any exudate or lesions.Normal dentition.  Neck: normal, supple, no masses, no thyromegaly Respiratory: Diminished at R base, but respirations unlabored Cardiovascular: Regular rate and rhythm, no murmurs / rubs / gallops. No extremity edema. 2+ pedal pulses. No carotid bruits.  Abdomen: no tenderness, no masses palpated. No hepatosplenomegaly. Bowel sounds positive.  Musculoskeletal: no clubbing / cyanosis. No joint deformity upper and lower extremities. Good ROM, no contractures. Normal muscle tone.  Skin: no rashes, lesions, ulcers. No induration Neurologic: MAE Psychiatric: advanced dementia   Labs on Admission: I have personally reviewed following labs and imaging studies  CBC: Recent Labs  Lab 01/07/19 0111  WBC 17.7*  NEUTROABS 13.1*  HGB 10.1*  HCT 33.2*  MCV 90.2  PLT 456   Basic Metabolic Panel: Recent Labs  Lab 01/07/19 0111  NA 146*  K 3.8  CL 112*  CO2 25  GLUCOSE 141*  BUN 37*  CREATININE 0.96  CALCIUM 9.5  MG 1.9   GFR: CrCl cannot be calculated (Unknown ideal weight.). Liver Function Tests: Recent Labs  Lab 01/07/19  0111  AST 18  ALT 25  ALKPHOS 80  BILITOT 0.4  PROT 5.5*  ALBUMIN 2.2*   No results for input(s): LIPASE, AMYLASE in the last 168 hours. No results for input(s): AMMONIA in the last 168 hours. Coagulation Profile: No results for input(s): INR, PROTIME in the last 168 hours. Cardiac Enzymes: No results for input(s): CKTOTAL, CKMB, CKMBINDEX, TROPONINI in the last 168 hours. BNP (last 3 results) No results for input(s): PROBNP in the last 8760 hours. HbA1C: No results for input(s): HGBA1C in the last 72 hours. CBG: No results for input(s): GLUCAP in the last 168 hours. Lipid Profile: No results for input(s): CHOL, HDL, LDLCALC, TRIG, CHOLHDL, LDLDIRECT in the last 72  hours. Thyroid Function Tests: No results for input(s): TSH, T4TOTAL, FREET4, T3FREE, THYROIDAB in the last 72 hours. Anemia Panel: No results for input(s): VITAMINB12, FOLATE, FERRITIN, TIBC, IRON, RETICCTPCT in the last 72 hours. Urine analysis:    Component Value Date/Time   COLORURINE YELLOW 01/07/2019 0245   APPEARANCEUR CLEAR 01/07/2019 0245   LABSPEC 1.023 01/07/2019 0245   PHURINE 5.0 01/07/2019 0245   GLUCOSEU NEGATIVE 01/07/2019 0245   HGBUR NEGATIVE 01/07/2019 0245   BILIRUBINUR NEGATIVE 01/07/2019 0245   KETONESUR NEGATIVE 01/07/2019 0245   PROTEINUR NEGATIVE 01/07/2019 0245   NITRITE NEGATIVE 01/07/2019 0245   LEUKOCYTESUR NEGATIVE 01/07/2019 0245    Radiological Exams on Admission: Dg Chest Port 1 View  Result Date: 01/07/2019 CLINICAL DATA:  Altered mental status EXAM: PORTABLE CHEST 1 VIEW COMPARISON:  Numerous radiographs most recently November 12, 2018, CT 04/11/2018 FINDINGS: New moderate right pleural effusion with thickening laterally, likely tracking into the minor fissure. There is persistent collapse of the right middle lobe. More focal patchy airspace disease is remaining aerated portions of the right lower lobe. Pacer/defibrillator pack overlies the left chest wall with leads in the right  atrium and cardiac apex. The aorta is calcified and tortuous. The remaining cardiomediastinal contours are unchanged from prior exam including numerous upper abdominal and mediastinal surgical clips. No acute osseous or soft tissue abnormality. IMPRESSION: New airspace disease in the right lower lung with moderate right pleural effusion tracking into minor fissure. Suspect persistent subtotal collapse of the right middle lobe Electronically Signed   By: Lovena Le M.D.   On: 01/07/2019 01:35    EKG: Independently reviewed.  Assessment/Plan Principal Problem:   HCAP (healthcare-associated pneumonia) Active Problems:   Chronic systolic congestive heart failure (HCC)   Dementia with behavioral disturbance (HCC)   Essential hypertension   ICD (implantable cardioverter-defibrillator) in place   Adult failure to thrive   Parapneumonic effusion    1. HCAP - 1. RLL HCAP 2. Got cefepime and vanc in ED 3. Will continue on rocephin / azithro 4. Add vanc if MRSA PCR positive (was positive in June). 5. PNA pathway 6. Cultures pending 7. Trend WBC 2. Parapneumonic effusion - 1. IR eval and drain 3. Chronic systolic CHF - EF 95-09% (6/2 DC summary) 1. Think patient is actually slightly dry right now 2. Check BNP 3. Giving 500cc NS bolus then 50 cc/hr NS 4. Hold lasix 4. End stage dementia - with adult failure to thrive 1. Swallow eval to r/o aspiration given the RLL PNA. 2. Continue chronic meds 3. Pal care consult 5. HTN - continue coreg 6. H/o CAD - Continue ASA, plavix, statin  DVT prophylaxis: Lovenox Code Status: Full Code per family Family Communication: No family in room Disposition Plan: SNF after admit Consults called: pal care consult put into epic Admission status: Admit to inpatient  Severity of Illness: The appropriate patient status for this patient is INPATIENT. Inpatient status is judged to be reasonable and necessary in order to provide the required intensity of  service to ensure the patient's safety. The patient's presenting symptoms, physical exam findings, and initial radiographic and laboratory data in the context of their chronic comorbidities is felt to place them at high risk for further clinical deterioration. Furthermore, it is not anticipated that the patient will be medically stable for discharge from the hospital within 2 midnights of admission. The following factors support the patient status of inpatient.   " The patient's presenting symptoms include hypoxia, tachycardia to 200. "  The worrisome physical exam findings include diminished breath sounds RLL. " The initial radiographic and laboratory data are worrisome because of RLL PNA and moderate R pleural effusion. " The chronic co-morbidities include end stage dementia, CHF, severe malnutrition / adult failure to thrive.  Other notes: 1) Failed outpatient IV ABx treatment for PNA 2) h/o MRSA + PCR in June (retesting now)   * I certify that at the point of admission it is my clinical judgment that the patient will require inpatient hospital care spanning beyond 2 midnights from the point of admission due to high intensity of service, high risk for further deterioration and high frequency of surveillance required.*    ,  M. DO [facility]  How to contact the [facility] Attending or Consulting provider [place] or covering provider during after hours [place], for this patient?  1. Check the care team in [facility] and look for a) attending/consulting TR[facility] provider listed and b) the [facility] team listed 2. Log into [email or web address]  Amion Physician Scheduling and messaging for groups and whole hospitals  On call and physician scheduling software for group practices, residents, hospitalists and other medical providers for call, clinic, rotation and shift schedules. OnCall [employer] is a hospital-wide system for scheduling doctors and paging doctors on call. EasyPlot is for scientific plotting and  data analysis.  [email or web address]  and use [place]'s universal password to access. If you do not have the password, please contact the hospital operator.  3. Locate the [facility] provider you are looking for under [facility] and page to a number that you can be directly reached. 4. If you still have difficulty reaching the provider, please page the [facility] (Director on Call) for the Hospitalists listed on amion for assistance.  01/07/2019, 3:42 AM

## 2019-01-07 NOTE — ED Notes (Signed)
Attempted report 

## 2019-01-07 NOTE — Progress Notes (Signed)
[facility]  Maurice Mckee is a 85 y.o. male with medical history significant of end stage dementia, possible seizure and was started on Keppra, HTN, CAD, CHF EF 45-50%, AICD placement.  Patient presents to the ED from [facility] SNF for HR in 200s and pulse ox in 80s (per ED provider's note).  Placed on 2 L O2 in ED. Per son, the patient was on antibiotics for a pneumonia for 3- 4 days.   CXR > RLL infiltrate with R moderate sized pleural effusion WBC 17.  Started on Vanc and Cefepime in ED- h/o MRSA + PCR in May.  Today's Vitals   01/07/19 0900 01/07/19 0930 01/07/19 1000 01/07/19 1030  BP: 121/78 105/74 (!) 129/92 127/87  Pulse: 80 83 70 77  Resp: 13 13 13 12   Temp:      TempSrc:      SpO2: 99% 99% 96% 97%  PainSc:       Patient is sleepy- moves upper extremities does not open eyes, speak or follow commands  Principal Problem:   HCAP (healthcare-associated pneumonia) - ? Aspiration- cont Cefepime and Vanc- was on Levaquin as outpt - IR consulted to drain pleural effusion which may be parapneumonic  - palliative care consulted in June >  family wanted aggressive measures - SLP eval ordered- NPO for now  Active Problems:   Chronic systolic congestive heart failure  - no ECHO in Epic - ? If right effusion is related to CHF    Dementia with behavioral disturbance - supportive care- on Remeron at home  H/o possible seizure in June - cont Keppra    ICD (implantable cardioverter-defibrillator) in place    Adult failure to thrive - cont aggressive measures per family     Sacral decubitus ulcer, stage II ([place]) - wound care consulted  Debbe Odea, MD Pager : [email or web address]

## 2019-01-07 NOTE — ED Notes (Signed)
Family at bedside. 

## 2019-01-07 NOTE — Progress Notes (Deleted)
Assisted tele visit to patient with daughter.  Arla Boutwell Anderson, RN   

## 2019-01-07 NOTE — Consult Note (Addendum)
Consultation Note Date: 01/07/2019   Patient Name: Maurice Mckee  DOB: 05-10-1926  MRN: [id number]  Age / Sex: 85 y.o., male  PCP: Patient, No Pcp Per Referring Physician: Debbe Odea, MD  Reason for Consultation: Establishing goals of care and Psychosocial/spiritual support  HPI/Patient Profile: 85 y.o. male  admitted on 01/07/2019 with  with past medical history significant of end stage dementia, recent hip fx, HTN, CAD, CVA, CHF EF 45-50%, AICD.  Patient presents to the ED from [facility], with reported tachycardia.     Currently treated being treated for pneumonia, elevated WBCs   Seen by palliative medicine team multiple times in past six months  Family report continued physical, functional and cognitive decline over the past year.   Family face treatment option decisions, advanced directive decisions and anticipatory care needs.   Clinical Assessment and Goals of Care:  This NP Wadie Lessen reviewed medical records, received report from team, assessed the patient and then spoke to her son by phone   to discuss diagnosis, prognosis, GOC, EOL wishes disposition and options.  I met with this family on November 13, 2018  Concept of Hospice and Palliative Care were re-intoduced  A  discussion was had today regarding advanced directives.  Concepts specific to code status, artifical feeding and hydration, continued IV antibiotics and rehospitalization was had.  The difference between a aggressive medical intervention path  and a palliative comfort care path for this patient at this time was had.  Values and goals of care important to patient and family were attempted to be elicited.  We discussed concepts of mortality and failure to thrive. Son describes " one thing after another keeps happening to my dad, an ongoing cycle"       Discussed the limitations of medical interventions when a  body begins to fail to thrive.  Son verbalizes understanding of the overall situation and realizes decisions need to be made.   He is the spokes- person for his family/5-6 other siblings.  Plan is to speak to entire family, as they make GOCs and EOL decisions.   Natural trajectory and expectations at EOL were discussed.  Questions and concerns addressed.   Family encouraged to call with questions or concerns.    PMT will continue to support holistically.   NEXT OF KIN- no documented HPOA or AD    SUMMARY OF RECOMMENDATIONS    Code Status/Advance Care Planning:   Full Code and ICD remain activated.  Strongly recommended family consider DNR/DNI status knowing poor outcomes in similar patient  Explained simplicity of de-activating AICD (son worried it required invasive intervention)  Palliative Prophylaxis:   Aspiration, Bowel Regimen, Delirium Protocol, Frequent Pain Assessment and Oral Care  Additional Recommendations (Limitations, Scope, Preferences):  Continue full scope of treatment until family comes together for decisions.  Son tells me he will call later today with decicions.   Await callback    Psycho-social/Spiritual:   Desire for further Chaplaincy support:no  Additional Recommendations: Education on Hospice  Prognosis:   <  3 months- will depend on decisions for life prolonging measures  Limited long term prognosis  Discharge Planning: To Be Determined      Primary Diagnoses: Present on Admission:  Dementia with behavioral disturbance (HCC)  Chronic systolic congestive heart failure (HCC)  Essential hypertension  HCAP (healthcare-associated pneumonia)  Parapneumonic effusion  Adult failure to thrive  ICD (implantable cardioverter-defibrillator) in place  Sacral decubitus ulcer, stage II ([place])   I have reviewed the medical record, interviewed the patient and family, and examined the patient. The following aspects are pertinent.  Past  Medical History:  Diagnosis Date   Arthritis    Cancer ([place])    Cardiomyopathy ([place])    CHF (congestive heart failure) ([place])    COPD (chronic obstructive pulmonary disease) (HCC)    Coronary artery disease    Dementia (HCC)    Hyperlipidemia    Hypertension    Social History   Socioeconomic History   Marital status: Widowed    Spouse name: Not on file   Number of children: Not on file   Years of education: Not on file   Highest education level: Not on file  Occupational History   Not on file  Social Needs   Financial resource strain: Not on file   Food insecurity    Worry: Not on file    Inability: Not on file   Transportation needs    Medical: Not on file    Non-medical: Not on file  Tobacco Use   Smoking status: Never Smoker   Smokeless tobacco: Never Used  Substance and Sexual Activity   Alcohol use: Never    Frequency: Never   Drug use: Never   Sexual activity: Not on file  Lifestyle   Physical activity    Days per week: Not on file    Minutes per session: Not on file   Stress: Not on file  Relationships   Social connections    Talks on phone: Not on file    Gets together: Not on file    Attends religious service: Not on file    Active member of club or organization: Not on file    Attends meetings of clubs or organizations: Not on file    Relationship status: Not on file  Other Topics Concern   Not on file  Social History Narrative   Not on file   Family History  Problem Relation Age of Onset   Cancer Mother    Scheduled Meds:  aspirin EC  325 mg Oral Daily   atorvastatin  40 mg Oral q1800   carvedilol  12.5 mg Oral BID WC   cholecalciferol  1,000 Units Oral Daily   clopidogrel  75 mg Oral Daily   collagenase   Topical Daily   docusate sodium  100 mg Oral BID   enoxaparin (LOVENOX) injection  40 mg Subcutaneous Q24H   ferrous sulfate  325 mg Oral Daily   lactose free nutrition  237 mL Oral BID BM    levETIRAcetam  250 mg Oral BID   Melatonin  3 mg Oral QHS   mirtazapine  7.5 mg Oral QHS   One-A-Day Proactive 65+  1 tablet Oral Q breakfast   Ensure Max Protein  237 mL Oral BID BM   Continuous Infusions:  sodium chloride 50 mL/hr at 01/07/19 0427   azithromycin Stopped (01/07/19 0531)   cefTRIAXone (ROCEPHIN)  IV     [START ON 01/08/2019] vancomycin     PRN Meds:. Medications Prior to  Admission:  Prior to Admission medications   Medication Sig Start Date End Date Taking? Authorizing Provider  acetaminophen (TYLENOL) 325 MG tablet Take 650 mg by mouth every 6 (six) hours as needed (for pain or fever >101 F).    Yes [provider]  aspirin EC 81 MG tablet Take 81 mg by mouth daily.   Yes [provider]  atorvastatin (LIPITOR) 40 MG tablet Take 40 mg by mouth daily at 6 PM.  11/09/16  Yes [provider]  carvedilol (COREG) 12.5 MG tablet Take 12.5 mg by mouth 2 (two) times daily with a meal.  11/09/16  Yes [provider]  cholecalciferol (VITAMIN D) 1000 units tablet Take 1,000 Units by mouth daily.   Yes [provider]  clopidogrel (PLAVIX) 75 MG tablet Take 75 mg by mouth daily.  11/09/16  Yes [provider]  collagenase (SANTYL) ointment Apply 1 application topically daily.   Yes [provider]  docusate sodium (COLACE) 100 MG capsule Take 1 capsule (100 mg total) by mouth 2 (two) times daily. 10/21/18  Yes Meccariello, Bernita Raisin, DO  Ensure Plus (ENSURE PLUS) LIQD Take 237 mLs by mouth 2 (two) times daily between meals.   Yes [provider]  ferrous sulfate 325 (65 FE) MG tablet Take 325 mg by mouth daily.   Yes [provider]  furosemide (LASIX) 20 MG tablet Take 20 mg by mouth daily.    Yes [provider]  levETIRAcetam (KEPPRA) 250 MG tablet Take 1 tablet (250 mg total) by mouth 2 (two) times daily. 11/18/18  Yes Mullis, Kiersten P, DO  levofloxacin (LEVAQUIN) 500 MG tablet Take 500  mg by mouth daily. For 7 days Started on 8.15.20   Yes [provider]  mirtazapine (REMERON) 7.5 MG tablet Take 7.5 mg by mouth at bedtime.   Yes [provider]  Multiple Vitamin (MULTIVITAMIN WITH MINERALS) TABS tablet Take 1 tablet by mouth daily.   Yes [provider]  Multiple Vitamins-Minerals (ONE-A-DAY PROACTIVE 65+) TABS Take 1 tablet by mouth daily with breakfast.   Yes [provider]  Phenylephrine-DM-GG (MUCINEX COLD CHILDRENS PO) Take 15 mg by mouth daily. For seven days Started on 8.15.20 (dose is not clarified on MAR)   Yes [provider]  lactose free nutrition (BOOST PLUS) LIQD Take 237 mLs by mouth 2 (two) times daily between meals. Patient not taking: Reported on 11/12/2018 10/21/18   Meccariello, Bernita Raisin, DO   Allergies  Allergen Reactions   Lactose Intolerance (Gi) Other (See Comments)    "Allergic," per [facility]   Review of Systems  Unable to perform ROS: Acuity of condition    Physical Exam  Vital Signs: BP (!) 146/90    Pulse 88    Temp 97.7 F (36.5 C) (Oral)    Resp 15    SpO2 98%  Pain Scale: 0-10   Pain Score: 0-No pain   SpO2: SpO2: 98 % O2 Device:SpO2: 98 % O2 Flow Rate: .   IO: Intake/output summary:   Intake/Output Summary (Last 24 hours) at 01/07/2019 1233 Last data filed at 01/07/2019 0531 Gross per 24 hour  Intake 1101.62 ml  Output --  Net 1101.62 ml    LBM:   Baseline Weight:   Most recent weight:       Palliative Assessment/Data: 30 % at best     Time In: 1120 Time Out: 1230 Time Total: 70 minutes Greater than 50%  of this time was spent  counseling and coordinating care related to the above assessment and plan.  Signed by: Wadie Lessen, NP   Please contact Palliative Medicine Team phone at [phone] for questions and concerns.  For individual provider: See Shea Evans

## 2019-01-07 NOTE — ED Provider Notes (Signed)
[facility] EMERGENCY DEPARTMENT Provider Note   CSN: [id number] Arrival date & time: 01/07/19  0004    History   Chief Complaint Chief Complaint  Patient presents with   Shortness of Breath    LEVEL 5 CAVEAT 2/2 DEMENTIA  HPI Maurice Mckee is a 85 y.o. male.     85 y/o with hx of end stage dementia, hypertension, dyslipidemia, CAD, CHF and cardiomyopathy (EF 45-50%) status post AICD placement, COPD presents to the emergency department from [facility].  Nurse at the facility states that the patient was noted to have a heart rate in the 200s with sats in the 80s.  He has no history of chronic oxygen requirement.  The nurse that I spoke with is unsure of why his vitals were checked in the first place.  He is not on ongoing telemetry.  Patient with stable vital signs on EMS arrival.  He had been placed on 2 L oxygen via nasal cannula for comfort prior to transport.  Facility is unable to specify whether or not his AICD fired prior to arrival.  CODE STATUS: FULL CODE  The history is provided by the patient. No language interpreter was used.  Shortness of Breath   Past Medical History:  Diagnosis Date   Arthritis    Cancer ([place])    Cardiomyopathy ([place])    CHF (congestive heart failure) ([place])    COPD (chronic obstructive pulmonary disease) ([place])    Coronary artery disease    Dementia ([place])    Hyperlipidemia    Hypertension     Patient Active Problem List   Diagnosis Date Noted   HCAP (healthcare-associated pneumonia) 01/07/2019   Parapneumonic effusion 01/07/2019   Adult failure to thrive    Hypotension    Near syncope    Agitation    Altered mental status, unspecified 11/12/2018   Hypoxia    Protein-calorie malnutrition, severe 10/16/2018   Pneumothorax    Pressure injury of skin 10/11/2018   Palliative care by specialist    Hip fracture ([place]) 10/10/2018   Multiple falls 03/18/2018   Fracture of inferior pubic  ramus ([place]) 03/18/2018   Delirium 03/18/2018   BPH (benign prostatic hyperplasia) 03/18/2018   Urinary urgency 06/21/2017   Spinal stenosis of lumbar region 05/26/2017   Fracture of two ribs of right side, closed, initial encounter 01/25/2017   Anemia, normocytic normochromic 11/09/2016   Non-seasonal allergic rhinitis 02/15/2016   Risk for falls 11/16/2015   Vision problem 11/16/2015   Vitamin D deficiency 07/29/2015   Urinary incontinence 07/08/2015   Pulmonary emphysema ([place]) 11/26/2014   ICD (implantable cardioverter-defibrillator) in place 11/25/2014   Primary osteoarthritis of right hip 08/30/2014   Hyperlipidemia 05/10/2014   Right hip pain 12/24/2013   Enlarged prostate without lower urinary tract symptoms (luts) 12/24/2013   Hyperglycemia 12/24/2013   Numbness 09/98/3382   Chronic systolic congestive heart failure ([place]) 06/11/2013   Congestive heart failure ([place]) 06/11/2013   Dementia with behavioral disturbance ([place]) 06/11/2013   Essential hypertension 06/11/2013   Primary cardiomyopathy ([place]) 06/11/2013   Abnormal glucose 03/02/2013   Diarrhea 03/02/2013   Insomnia 03/02/2013   Abnormality of gait 10/25/2012   DNR (do not resuscitate) discussion 10/25/2012   Generalized osteoarthritis of multiple sites 10/25/2012   Skin sensation disturbance 10/25/2012   Backache 08/20/2011   Other allergy, other than to medicinal agents 01/19/2010    Past Surgical History:  Procedure Laterality Date   CARDIAC PACEMAKER PLACEMENT     INTRAMEDULLARY (  IM) NAIL INTERTROCHANTERIC Right 10/12/2018   Procedure: INTRAMEDULLARY (IM) NAIL INTERTROCHANTRIC;  Surgeon: Nicholes Stairs, MD;  Location: [facility];  Service: Orthopedics;  Laterality: Right;        Home Medications    Prior to Admission medications   Medication Sig Start Date End Date Taking? Authorizing Provider  acetaminophen (TYLENOL) 325 MG tablet Take 650 mg by mouth every 6 (six)  hours as needed (for pain or fever >101 F).     [provider]  aspirin EC 325 MG tablet Take 325 mg by mouth daily.    [provider]  atorvastatin (LIPITOR) 40 MG tablet Take 40 mg by mouth daily at 6 PM.  11/09/16   [provider]  carvedilol (COREG) 12.5 MG tablet Take 12.5 mg by mouth 2 (two) times daily with a meal.  11/09/16   [provider]  cholecalciferol (VITAMIN D) 1000 units tablet Take 1,000 Units by mouth daily.    [provider]  clopidogrel (PLAVIX) 75 MG tablet Take 75 mg by mouth daily.  11/09/16   [provider]  docusate sodium (COLACE) 100 MG capsule Take 1 capsule (100 mg total) by mouth 2 (two) times daily. 10/21/18   Meccariello, Bernita Raisin, DO  Ensure Plus (ENSURE PLUS) LIQD Take 237 mLs by mouth 2 (two) times daily between meals.    [provider]  ferrous sulfate 325 (65 FE) MG tablet Take 325 mg by mouth daily.    [provider]  furosemide (LASIX) 20 MG tablet Take 20 mg by mouth daily.     [provider]  lactose free nutrition (BOOST PLUS) LIQD Take 237 mLs by mouth 2 (two) times daily between meals. Patient not taking: Reported on 11/12/2018 10/21/18   Meccariello, Bernita Raisin, DO  levETIRAcetam (KEPPRA) 250 MG tablet Take 1 tablet (250 mg total) by mouth 2 (two) times daily. 11/18/18   Mullis, Kiersten P, DO  Melatonin 3 MG TABS Take 3 mg by mouth at bedtime.    [provider]  mirtazapine (REMERON) 7.5 MG tablet Take 7.5 mg by mouth at bedtime.    [provider]  Multiple Vitamin (MULTIVITAMIN WITH MINERALS) TABS tablet Take 1 tablet by mouth daily. Patient not taking: Reported on 11/12/2018 10/22/18   Meccariello, Bernita Raisin, DO  Multiple Vitamins-Minerals (ONE-A-DAY PROACTIVE 65+) TABS Take 1 tablet by mouth daily with breakfast.    [provider]    Family History Family History  Problem Relation Age of Onset   Cancer Mother     Social History Social  History   Tobacco Use   Smoking status: Never Smoker   Smokeless tobacco: Never Used  Substance Use Topics   Alcohol use: Never    Frequency: Never   Drug use: Never     Allergies   Lactose intolerance (gi)   Review of Systems Review of Systems  Unable to perform ROS: Dementia  Respiratory: Positive for shortness of breath.      Physical Exam Updated Vital Signs BP 137/70    Pulse 63    Temp 97.7 F (36.5 C) (Oral)    Resp (!) 23    SpO2 100%   Physical Exam Vitals signs and nursing note reviewed.  Constitutional:      General: He is not in acute distress.    Appearance: He is not diaphoretic.     Comments: Frail, cachectic in appearance.  In no acute distress.  HENT:     Head:  Normocephalic and atraumatic.  Eyes:     General: No scleral icterus.    Conjunctiva/sclera: Conjunctivae normal.  Neck:     Musculoskeletal: Normal range of motion.  Cardiovascular:     Rate and Rhythm: Normal rate and regular rhythm.     Pulses: Normal pulses.  Pulmonary:     Effort: Pulmonary effort is normal. No respiratory distress.     Breath sounds: No wheezing.     Comments: Respirations even and unlabored. Decreased breath sounds RLL.  Abdominal:     Comments: Soft, nondistended, nontender abdomen  Musculoskeletal: Normal range of motion.  Skin:    General: Skin is warm and dry.     Coloration: Skin is not pale.     Findings: No erythema or rash.  Neurological:     Mental Status: He is alert.     Comments: Alert and moving extremities.  Psychiatric:        Behavior: Behavior normal.      ED Treatments / Results  Labs (all labs ordered are listed, but only abnormal results are displayed) Labs Reviewed  CBC WITH DIFFERENTIAL/PLATELET - Abnormal; Notable for the following components:      Result Value   WBC 17.7 (*)    RBC 3.68 (*)    Hemoglobin 10.1 (*)    HCT 33.2 (*)    RDW 16.5 (*)    Neutro Abs 13.1 (*)    Monocytes Absolute 1.3 (*)    Abs Immature  Granulocytes 0.18 (*)    All other components within normal limits  COMPREHENSIVE METABOLIC PANEL - Abnormal; Notable for the following components:   Sodium 146 (*)    Chloride 112 (*)    Glucose, Bld 141 (*)    BUN 37 (*)    Total Protein 5.5 (*)    Albumin 2.2 (*)    All other components within normal limits  SARS CORONAVIRUS 2 (HOSPITAL ORDER, [facility])  CULTURE, BLOOD (ROUTINE X 2)  CULTURE, BLOOD (ROUTINE X 2)  MRSA PCR SCREENING  EXPECTORATED SPUTUM ASSESSMENT W REFEX TO RESP CULTURE  URINALYSIS, ROUTINE W REFLEX MICROSCOPIC  MAGNESIUM  LACTIC ACID, PLASMA  HIV ANTIBODY (ROUTINE TESTING W REFLEX)  BRAIN NATRIURETIC PEPTIDE  PROCALCITONIN    EKG EKG Interpretation  Date/Time:  Wednesday January 07 2019 01:03:43 EDT Ventricular Rate:  98 PR Interval:    QRS Duration: 91 QT Interval:  329 QTC Calculation: 420 R Axis:   97 Text Interpretation:  Normal sinus rhythm Atrial premature complex Right axis deviation Abnormal lateral Q waves No significant change since last tracing Confirmed by Deno Etienne [id number]) on 01/07/2019 1:24:32 AM   Radiology Dg Chest Port 1 View  Result Date: 01/07/2019 CLINICAL DATA:  Altered mental status EXAM: PORTABLE CHEST 1 VIEW COMPARISON:  Numerous radiographs most recently November 12, 2018, CT 04/11/2018 FINDINGS: New moderate right pleural effusion with thickening laterally, likely tracking into the minor fissure. There is persistent collapse of the right middle lobe. More focal patchy airspace disease is remaining aerated portions of the right lower lobe. Pacer/defibrillator pack overlies the left chest wall with leads in the right atrium and cardiac apex. The aorta is calcified and tortuous. The remaining cardiomediastinal contours are unchanged from prior exam including numerous upper abdominal and mediastinal surgical clips. No acute osseous or soft tissue abnormality. IMPRESSION: New airspace disease in the right lower  lung with moderate right pleural effusion tracking into minor fissure. Suspect persistent subtotal collapse of the right  middle lobe Electronically Signed   By: Lovena Le M.D.   On: 01/07/2019 01:35    Procedures .Critical Care Performed by: Antonietta Breach, PA-C Authorized by: Antonietta Breach, PA-C   Critical care provider statement:    Critical care time (minutes):  45   Critical care was necessary to treat or prevent imminent or life-threatening deterioration of the following conditions:  Sepsis   Critical care was time spent personally by me on the following activities:  Discussions with consultants, evaluation of patient's response to treatment, examination of patient, ordering and performing treatments and interventions, ordering and review of laboratory studies, ordering and review of radiographic studies, pulse oximetry, re-evaluation of patient's condition, obtaining history from patient or surrogate and review of old charts   (including critical care time)  Medications Ordered in ED Medications  vancomycin (VANCOCIN) 1,250 mg in sodium chloride 0.9 % 250 mL IVPB (1,250 mg Intravenous New Bag/Given 01/07/19 0240)  sodium chloride 0.9 % bolus 1,000 mL (has no administration in time range)  enoxaparin (LOVENOX) injection 40 mg (has no administration in time range)  cefTRIAXone (ROCEPHIN) 2 g in sodium chloride 0.9 % 100 mL IVPB (has no administration in time range)  azithromycin (ZITHROMAX) 500 mg in sodium chloride 0.9 % 250 mL IVPB (has no administration in time range)  0.9 %  sodium chloride infusion (has no administration in time range)  ceFEPIme (MAXIPIME) 2 g in sodium chloride 0.9 % 100 mL IVPB (2 g Intravenous New Bag/Given 01/07/19 0241)    2:22 AM Patient, per son, has been on antibiotics since this past Friday or Saturday for presumed PNA. Son aware of plan for admission and confirms FULL CODE status.   Initial Impression / Assessment and Plan / ED Course  I have reviewed  the triage vital signs and the nursing notes.  Pertinent labs & imaging results that were available during my care of the patient were reviewed by me and considered in my medical decision making (see chart for details).        85 year old male with end-stage dementia presents to the emergency department from his nursing facility.  They report that his heart rate went up into the 200s with desaturation into the 80s.  He has not had any similar cardiac events since arrival in the emergency department and has continued to sat well on room air.  Patient did undergo chest x-ray with blood work.  He has a worsening leukocytosis compared to his discharge 1.5 months ago.  Leukocytosis suspected secondary to acute HCAP as chest x-ray appears worse compared to the end of June.  There is question of persistent subtotal collapse of the right middle lobe.  It appears that the patient had a chest tube placed for this while admitted in May.  Given that he is in no respiratory distress with preserved oxygen saturations, I do not see indication for emergent chest tube placement.  He will be admitted to the hospitalist service for ongoing management.  Family updated.  Vitals:   01/07/19 0026 01/07/19 0030 01/07/19 0200 01/07/19 0300  BP: 132/85 120/65 129/88 137/70  Pulse: 88 94 95 63  Resp: 16 (!) 24 12 (!) 23  Temp: 97.7 F (36.5 C)     TempSrc: Oral     SpO2: 94% 93% 93% 100%    Final Clinical Impressions(s) / ED Diagnoses   Final diagnoses:  HCAP (healthcare-associated pneumonia)    ED Discharge Orders    None  Antonietta Breach, PA-C 01/07/19 Burchinal, Playita Cortada, DO 01/07/19 [phone]

## 2019-01-08 ENCOUNTER — Inpatient Hospital Stay (HOSPITAL_COMMUNITY): Payer: Medicare Other

## 2019-01-08 DIAGNOSIS — J90 Pleural effusion, not elsewhere classified: Secondary | ICD-10-CM

## 2019-01-08 LAB — CBC
HCT: 23.2 % — ABNORMAL LOW (ref 39.0–52.0)
Hemoglobin: 7 g/dL — ABNORMAL LOW (ref 13.0–17.0)
MCH: 27.6 pg (ref 26.0–34.0)
MCHC: 30.2 g/dL (ref 30.0–36.0)
MCV: 91.3 fL (ref 80.0–100.0)
Platelets: 220 K/uL (ref 150–400)
RBC: 2.54 MIL/uL — ABNORMAL LOW (ref 4.22–5.81)
RDW: 16.6 % — ABNORMAL HIGH (ref 11.5–15.5)
WBC: 14.1 K/uL — ABNORMAL HIGH (ref 4.0–10.5)
nRBC: 0 % (ref 0.0–0.2)

## 2019-01-08 LAB — BASIC METABOLIC PANEL
Anion gap: 7 (ref 5–15)
BUN: 42 mg/dL — ABNORMAL HIGH (ref 8–23)
CO2: 24 mmol/L (ref 22–32)
Calcium: 9.1 mg/dL (ref 8.9–10.3)
Chloride: 118 mmol/L — ABNORMAL HIGH (ref 98–111)
Creatinine, Ser: 0.69 mg/dL (ref 0.61–1.24)
GFR calc Af Amer: >60 mL/min (ref >60)
GFR calc non Af Amer: >60 mL/min (ref >60)
Glucose, Bld: 108 mg/dL — ABNORMAL HIGH (ref 70–99)
Potassium: 3.6 mmol/L (ref 3.5–5.1)
Sodium: 149 mmol/L — ABNORMAL HIGH (ref 135–145)

## 2019-01-08 LAB — BLOOD CULTURE ID PANEL (REFLEXED)

## 2019-01-08 MED ORDER — PRO-STAT SUGAR FREE PO LIQD
30.0000 mL | Freq: Two times a day (BID) | ORAL | Status: DC
  Administered 2019-01-08 (×2): 30 mL via ORAL
  Filled 2019-01-08 (×2): qty 30

## 2019-01-08 MED ORDER — AMIODARONE HCL IN DEXTROSE 360-4.14 MG/200ML-% IV SOLN
60.0000 mg/h | INTRAVENOUS | Status: DC
  Administered 2019-01-09: 60 mg/h via INTRAVENOUS
  Filled 2019-01-08: qty 200

## 2019-01-08 MED ORDER — SODIUM CHLORIDE 0.9% IV SOLUTION
Freq: Once | INTRAVENOUS | Status: AC
  Administered 2019-01-09: via INTRAVENOUS

## 2019-01-08 MED ORDER — SODIUM CHLORIDE 0.9 % IV BOLUS: 250.0000 mL | Freq: Once | INTRAVENOUS | Status: DC

## 2019-01-08 MED ORDER — AMIODARONE IV BOLUS ONLY 150 MG/100ML
150.0000 mg | Freq: Once | INTRAVENOUS | Status: DC
  Filled 2019-01-08: qty 100

## 2019-01-08 MED ORDER — ENSURE ENLIVE PO LIQD
237.0000 mL | Freq: Three times a day (TID) | ORAL | Status: DC
  Administered 2019-01-08: 237 mL via ORAL

## 2019-01-08 MED ORDER — DOCUSATE SODIUM 50 MG/5ML PO LIQD
100.0000 mg | Freq: Two times a day (BID) | ORAL | Status: DC
  Administered 2019-01-08 – 2019-01-12 (×5): 100 mg via ORAL
  Filled 2019-01-08 (×7): qty 10

## 2019-01-08 MED ORDER — SODIUM CHLORIDE 0.9 % IV BOLUS
500.0000 mL | Freq: Once | INTRAVENOUS | Status: AC
  Administered 2019-01-08: 500 mL via INTRAVENOUS

## 2019-01-08 MED ORDER — AMIODARONE HCL IN DEXTROSE 360-4.14 MG/200ML-% IV SOLN
30.0000 mg/h | INTRAVENOUS | Status: DC
  Administered 2019-01-09: 06:00:00 30 mg/h via INTRAVENOUS
  Filled 2019-01-08 (×2): qty 200

## 2019-01-08 MED ORDER — LIDOCAINE HCL 1 % IJ SOLN
INTRAMUSCULAR | Status: AC
  Filled 2019-01-08: qty 20

## 2019-01-08 NOTE — Progress Notes (Signed)
PHARMACY - PHYSICIAN COMMUNICATION CRITICAL VALUE ALERT - BLOOD CULTURE IDENTIFICATION (BCID)  Maurice Mckee is an 85 y.o. male who presented to [facility] on 01/07/2019 with a chief complaint of PNA  Assessment:  1/4 BC with staph species, Methicillin resistance detected, MRSA pcr positive  Name of physician (or Provider) Contacted: Tylene Fantasia  Current antibiotics: Vanc and rocephin  Changes to prescribed antibiotics recommended:  Can consider dc rocephin  Results for orders placed or performed during the hospital encounter of 01/07/19  Blood Culture ID Panel (Reflexed) (Collected: 01/07/2019  2:30 AM)  Result Value Ref Range   Enterococcus species NOT DETECTED NOT DETECTED   Listeria monocytogenes NOT DETECTED NOT DETECTED   Staphylococcus species DETECTED (A) NOT DETECTED   Staphylococcus aureus (BCID) NOT DETECTED NOT DETECTED   Methicillin resistance DETECTED (A) NOT DETECTED   Streptococcus species NOT DETECTED NOT DETECTED   Streptococcus agalactiae NOT DETECTED NOT DETECTED   Streptococcus pneumoniae NOT DETECTED NOT DETECTED   Streptococcus pyogenes NOT DETECTED NOT DETECTED   Acinetobacter baumannii NOT DETECTED NOT DETECTED   Enterobacteriaceae species NOT DETECTED NOT DETECTED   Enterobacter cloacae complex NOT DETECTED NOT DETECTED   Escherichia coli NOT DETECTED NOT DETECTED   Klebsiella oxytoca NOT DETECTED NOT DETECTED   Klebsiella pneumoniae NOT DETECTED NOT DETECTED   Proteus species NOT DETECTED NOT DETECTED   Serratia marcescens NOT DETECTED NOT DETECTED   Haemophilus influenzae NOT DETECTED NOT DETECTED   Neisseria meningitidis NOT DETECTED NOT DETECTED   Pseudomonas aeruginosa NOT DETECTED NOT DETECTED   Candida albicans NOT DETECTED NOT DETECTED   Candida glabrata NOT DETECTED NOT DETECTED   Candida krusei NOT DETECTED NOT DETECTED   Candida parapsilosis NOT DETECTED NOT DETECTED   Candida tropicalis NOT DETECTED NOT DETECTED    Beverlee Nims 01/08/2019  5:48 AM

## 2019-01-08 NOTE — Progress Notes (Addendum)
PROGRESS NOTE    Maurice Mckee   YTK:[id number]  DOB: 01-19-1926  DOA: 01/07/2019 PCP: Patient, No Pcp Per   Brief Narrative:  Maurice Mckee is a 85 y.o.malewith medical history significant ofend stage dementia, possible seizure and was started on Keppra, HTN, CAD, CHF EF 45-50%, AICD placement. Patient presents to the ED from [facility] SN[facility] for HR in 200s and pulse ox in 80s (per ED provider's note).  Placed on 2 L O2 in ED. Per son, the patient was on antibiotics for a pneumonia for 3- 4 days.   Subjective: No complaints. Being fed by RN and eating well.     Assessment & Plan:  Principal Problem:  HCAP (healthcare-associated pneumonia), acute respiratory failure with right pleural effusion - ? Aspiration- MRSA PCR +  - was on Levaquin as outpt- now on Vanc, Ceftraixone and Azithromycin - IR consulted to drain pleural effusion which may be parapneumonic  - palliative care consulted in June >  family wanted aggressive measures - SLP eval > Dys 1 diet with thin liquids  Active Problems:   Chronic systolic congestive heart failure  - no ECHO in Epic - ? If right effusion is related to CHF  Anemia - Hb dropping from 10 to 7.0 today- no acute bleeding- likely is dilutional- check anemia panel - hold off on transfusion     Dementia with behavioral disturbance - supportive care- on Remeron at home  H/o possible seizure in June - cont Keppra    ICD (implantable cardioverter-defibrillator) in place    Adult failure to thrive - cont aggressive measures per family     Sacral decubitus ulcer, stage II ([place]) - wound care consulted and recommendations noted   Time spent in minutes: 35 DVT prophylaxis: Lovenox Code Status: Full code Family Communication:  Disposition Plan: return to SNF Consultants:   none Procedures:   none Antimicrobials:  Anti-infectives (From admission, onward)   Start     Dose/Rate Route Frequency Ordered Stop   01/08/19 0200   vancomycin (VANCOCIN) 1,250 mg in sodium chloride 0.9 % 250 mL IVPB     1,250 mg 166.7 mL/hr over 90 Minutes Intravenous Every 24 hours 01/07/19 0648     01/07/19 1400  cefTRIAXone (ROCEPHIN) 2 g in sodium chloride 0.9 % 100 mL IVPB     2 g 200 mL/hr over 30 Minutes Intravenous Every 24 hours 01/07/19 0317 01/12/19 1359   01/07/19 0400  azithromycin (ZITHROMAX) 500 mg in sodium chloride 0.9 % 250 mL IVPB     500 mg 250 mL/hr over 60 Minutes Intravenous Every 24 hours 01/07/19 0317 01/12/19 0359   01/07/19 0230  vancomycin (VANCOCIN) 1,250 mg in sodium chloride 0.9 % 250 mL IVPB     1,250 mg 166.7 mL/hr over 90 Minutes Intravenous  Once 01/07/19 0203 01/07/19 0415   01/07/19 0200  vancomycin (VANCOCIN) IVPB 1000 mg/200 mL premix  Status:  Discontinued     1,000 mg 200 mL/hr over 60 Minutes Intravenous  Once 01/07/19 0153 01/07/19 0203   01/07/19 0200  ceFEPIme (MAXIPIME) 2 g in sodium chloride 0.9 % 100 mL IVPB     2 g 200 mL/hr over 30 Minutes Intravenous  Once 01/07/19 0153 01/07/19 0319       Objective: Vitals:   01/07/19 1130 01/07/19 1245 01/07/19 2124 01/08/19 0605  BP: (!) 146/90 (!) 150/87 (!) 141/77 104/60  Pulse: 88 100 82 85  Resp: 15  16 14   Temp:  (!) 97.4 F (  36.3 C) 97.8 F (36.6 C) 98.6 F (37 C)  TempSrc:  Oral Oral   SpO2: 98% 100% 96% 98%    Intake/Output Summary (Last 24 hours) at 01/08/2019 8315 Last data filed at 01/08/2019 0500 Gross per 24 hour  Intake 1505.73 ml  Output -  Net 1505.73 ml   There were no vitals filed for this visit.  Examination: General exam: Appears comfortable  HEENT: PERRLA, oral mucosa moist, no sclera icterus or thrush Respiratory system: Clear to auscultation. Respiratory effort normal. Cardiovascular system: S1 & S2 heard, RRR.   Gastrointestinal system: Abdomen soft, non-tender, nondistended. Normal bowel sounds. Central nervous system: Alert and oriented only to person- speech is difficult to understand- does not  follow commands. No focal neurological deficits. Extremities: No cyanosis, clubbing or edema Skin: No rashes or ulcers Psychiatry:  Mood & affect appropriate.     Data Reviewed: I have personally reviewed following labs and imaging studies  CBC: Recent Labs  Lab 01/07/19 0111  WBC 17.7*  NEUTROABS 13.1*  HGB 10.1*  HCT 33.2*  MCV 90.2  PLT 176   Basic Metabolic Panel: Recent Labs  Lab 01/07/19 0111  NA 146*  K 3.8  CL 112*  CO2 25  GLUCOSE 141*  BUN 37*  CREATININE 0.96  CALCIUM 9.5  MG 1.9   GFR: CrCl cannot be calculated (Unknown ideal weight.). Liver Function Tests: Recent Labs  Lab 01/07/19 0111  AST 18  ALT 25  ALKPHOS 80  BILITOT 0.4  PROT 5.5*  ALBUMIN 2.2*   No results for input(s): LIPASE, AMYLASE in the last 168 hours. No results for input(s): AMMONIA in the last 168 hours. Coagulation Profile: No results for input(s): INR, PROTIME in the last 168 hours. Cardiac Enzymes: No results for input(s): CKTOTAL, CKMB, CKMBINDEX, TROPONINI in the last 168 hours. BNP (last 3 results) No results for input(s): PROBNP in the last 8760 hours. HbA1C: No results for input(s): HGBA1C in the last 72 hours. CBG: No results for input(s): GLUCAP in the last 168 hours. Lipid Profile: No results for input(s): CHOL, HDL, LDLCALC, TRIG, CHOLHDL, LDLDIRECT in the last 72 hours. Thyroid Function Tests: No results for input(s): TSH, T4TOTAL, FREET4, T3FREE, THYROIDAB in the last 72 hours. Anemia Panel: No results for input(s): VITAMINB12, FOLATE, FERRITIN, TIBC, IRON, RETICCTPCT in the last 72 hours. Urine analysis:    Component Value Date/Time   COLORURINE YELLOW 01/07/2019 0245   APPEARANCEUR CLEAR 01/07/2019 0245   LABSPEC 1.023 01/07/2019 0245   PHURINE 5.0 01/07/2019 0245   GLUCOSEU NEGATIVE 01/07/2019 0245   HGBUR NEGATIVE 01/07/2019 0245   BILIRUBINUR NEGATIVE 01/07/2019 0245   KETONESUR NEGATIVE 01/07/2019 0245   PROTEINUR NEGATIVE 01/07/2019 0245    NITRITE NEGATIVE 01/07/2019 0245   LEUKOCYTESUR NEGATIVE 01/07/2019 0245   Sepsis Labs: @LABRCNTIP (procalcitonin:4,lacticidven:4) ) Recent Results (from the past 240 hour(s))  Culture, blood (Routine X 2) w Reflex to ID Panel     Status: None (Preliminary result)   Collection Time: 01/07/19  2:30 AM   Specimen: BLOOD LEFT FOREARM  Result Value Ref Range Status   Specimen Description BLOOD LEFT FOREARM  Final   Special Requests   Final    BOTTLES DRAWN AEROBIC AND ANAEROBIC Blood Culture results may not be optimal due to an inadequate volume of blood received in culture bottles   Culture  Setup Time   Final    ANAEROBIC BOTTLE ONLY GRAM POSITIVE RODS CRITICAL RESULT CALLED TO, READ BACK BY AND VERIFIED WITH: J MILLEN  PHARMD 01/07/19 1953 JDW AEROBIC BOTTLE ONLY GRAM POSITIVE COCCI Organism ID to follow CRITICAL RESULT CALLED TO, READ BACK BY AND VERIFIED WITH: PHRMD @0536  01/08/19 BY S GEZAHEGN Performed at [facility], [place] [address], [place], [place] [address]    Culture GRAM POSITIVE RODS  Final   Report Status PENDING  Incomplete  Culture, blood (Routine X 2) w Reflex to ID Panel     Status: None (Preliminary result)   Collection Time: 01/07/19  2:30 AM   Specimen: BLOOD LEFT FOREARM  Result Value Ref Range Status   Specimen Description BLOOD LEFT FOREARM  Final   Special Requests   Final    BOTTLES DRAWN AEROBIC ONLY Blood Culture results may not be optimal due to an inadequate volume of blood received in culture bottles   Culture   Final    NO GROWTH 1 DAY Performed at [facility], [place] [address] [place], [place] [address]    Report Status PENDING  Incomplete  Blood Culture ID Panel (Reflexed)     Status: Abnormal   Collection Time: 01/07/19  2:30 AM  Result Value Ref Range Status   Enterococcus species NOT DETECTED NOT DETECTED Final   Listeria monocytogenes NOT DETECTED NOT DETECTED Final   Staphylococcus species DETECTED (A) NOT DETECTED Final     Comment: Methicillin (oxacillin) resistant coagulase negative staphylococcus. Possible blood culture contaminant (unless isolated from more than one blood culture draw or clinical case suggests pathogenicity). No antibiotic treatment is indicated for blood  culture contaminants. CRITICAL RESULT CALLED TO, READ BACK BY AND VERIFIED WITH: PHRMD @0536  01/08/19 BY S GEZAHEGN    Staphylococcus aureus (BCID) NOT DETECTED NOT DETECTED Final   Methicillin resistance DETECTED (A) NOT DETECTED Final    Comment: CRITICAL RESULT CALLED TO, READ BACK BY AND VERIFIED WITH: PHRMD @0536  01/08/19 BY S GEZAHEGN    Streptococcus species NOT DETECTED NOT DETECTED Final   Streptococcus agalactiae NOT DETECTED NOT DETECTED Final   Streptococcus pneumoniae NOT DETECTED NOT DETECTED Final   Streptococcus pyogenes NOT DETECTED NOT DETECTED Final   Acinetobacter baumannii NOT DETECTED NOT DETECTED Final   Enterobacteriaceae species NOT DETECTED NOT DETECTED Final   Enterobacter cloacae complex NOT DETECTED NOT DETECTED Final   Escherichia coli NOT DETECTED NOT DETECTED Final   Klebsiella oxytoca NOT DETECTED NOT DETECTED Final   Klebsiella pneumoniae NOT DETECTED NOT DETECTED Final   Proteus species NOT DETECTED NOT DETECTED Final   Serratia marcescens NOT DETECTED NOT DETECTED Final   Haemophilus influenzae NOT DETECTED NOT DETECTED Final   Neisseria meningitidis NOT DETECTED NOT DETECTED Final   Pseudomonas aeruginosa NOT DETECTED NOT DETECTED Final   Candida albicans NOT DETECTED NOT DETECTED Final   Candida glabrata NOT DETECTED NOT DETECTED Final   Candida krusei NOT DETECTED NOT DETECTED Final   Candida parapsilosis NOT DETECTED NOT DETECTED Final   Candida tropicalis NOT DETECTED NOT DETECTED Final    Comment: Performed at [facility], [place]. [address] [place], [place] [address]  SARS Coronavirus 2 [facility] order, Performed in [facility] hospital lab) Nasopharyngeal     Status: None   Collection  Time: 01/07/19  2:47 AM   Specimen: Nasopharyngeal  Result Value Ref Range Status   SARS Coronavirus 2 NEGATIVE NEGATIVE Final    Comment: (NOTE) If result is NEGATIVE SARS-CoV-2 target nucleic acids are NOT DETECTED. The SARS-CoV-2 RNA is generally detectable in upper and lower  respiratory specimens during the acute phase of  infection. The lowest  concentration of SARS-CoV-2 viral copies this assay can detect is 250  copies / mL. A negative result does not preclude SARS-CoV-2 infection  and should not be used as the sole basis for treatment or other  patient management decisions.  A negative result may occur with  improper specimen collection / handling, submission of specimen other  than nasopharyngeal swab, presence of viral mutation(s) within the  areas targeted by this assay, and inadequate number of viral copies  (<250 copies / mL). A negative result must be combined with clinical  observations, patient history, and epidemiological information. If result is POSITIVE SARS-CoV-2 target nucleic acids are DETECTED. The SARS-CoV-2 RNA is generally detectable in upper and lower  respiratory specimens dur ing the acute phase of infection.  Positive  results are indicative of active infection with SARS-CoV-2.  Clinical  correlation with patient history and other diagnostic information is  necessary to determine patient infection status.  Positive results do  not rule out bacterial infection or co-infection with other viruses. If result is PRESUMPTIVE POSTIVE SARS-CoV-2 nucleic acids MAY BE PRESENT.   A presumptive positive result was obtained on the submitted specimen  and confirmed on repeat testing.  While 2019 novel coronavirus  (SARS-CoV-2) nucleic acids may be present in the submitted sample  additional confirmatory testing may be necessary for epidemiological  and / or clinical management purposes  to differentiate between  SARS-CoV-2 and other Sarbecovirus currently known to  infect humans.  If clinically indicated additional testing with an alternate test  methodology [id number]) is advised. The SARS-CoV-2 RNA is generally  detectable in upper and lower respiratory sp ecimens during the acute  phase of infection. The expected result is Negative. Fact Sheet for Patients:  StrictlyIdeas.no Fact Sheet for Healthcare Providers: BankingDealers.co.za This test is not yet approved or cleared by the [place] FDA and has been authorized for detection and/or diagnosis of SARS-CoV-2 by FDA under an Emergency Use Authorization (EUA).  This EUA will remain in effect (meaning this test can be used) for the duration of the COVID-19 declaration under Section 564(b)(1) of the Act, 21 U.S.C. section 360bbb-3(b)(1), unless the authorization is terminated or revoked sooner. Performed at [facility], [place] [address] [place], [place] [address]   MRSA PCR Screening     Status: Abnormal   Collection Time: 01/07/19  3:35 AM   Specimen: Nasal Mucosa; Nasopharyngeal  Result Value Ref Range Status   MRSA by PCR POSITIVE (A) NEGATIVE Final    Comment:        The GeneXpert MRSA Assay (FDA approved for NASAL specimens only), is one component of a comprehensive MRSA colonization surveillance program. It is not intended to diagnose MRSA infection nor to guide or monitor treatment for MRSA infections. RESULT CALLED TO, READ BACK BY AND VERIFIED WITH: [facility] RN [id number] 01/07/2019 MITCHELL,L Performed at [facility], [place] [address], [place], [place] [address]          Radiology Studies: Dg Chest Port 1 View  Result Date: 01/07/2019 CLINICAL DATA:  Altered mental status EXAM: PORTABLE CHEST 1 VIEW COMPARISON:  Numerous radiographs most recently November 12, 2018, CT 04/11/2018 FINDINGS: New moderate right pleural effusion with thickening laterally, likely tracking into the minor fissure. There is persistent  collapse of the right middle lobe. More focal patchy airspace disease is remaining aerated portions of the right lower lobe. Pacer/defibrillator pack overlies the left chest wall with leads in the right atrium and  cardiac apex. The aorta is calcified and tortuous. The remaining cardiomediastinal contours are unchanged from prior exam including numerous upper abdominal and mediastinal surgical clips. No acute osseous or soft tissue abnormality. IMPRESSION: New airspace disease in the right lower lung with moderate right pleural effusion tracking into minor fissure. Suspect persistent subtotal collapse of the right middle lobe Electronically Signed   By: Lovena Le M.D.   On: 01/07/2019 01:35      Scheduled Meds: . aspirin EC  325 mg Oral Daily  . atorvastatin  40 mg Oral q1800  . carvedilol  12.5 mg Oral BID WC  . cholecalciferol  1,000 Units Oral Daily  . clopidogrel  75 mg Oral Daily  . collagenase   Topical Daily  . docusate sodium  100 mg Oral BID  . enoxaparin (LOVENOX) injection  40 mg Subcutaneous Q24H  . ferrous sulfate  325 mg Oral Daily  . lactose free nutrition  237 mL Oral BID BM  . Melatonin  3 mg Oral QHS  . mirtazapine  7.5 mg Oral QHS  . multivitamin with minerals  1 tablet Oral Q breakfast  . Ensure Max Protein  237 mL Oral BID BM   Continuous Infusions: . sodium chloride 50 mL/hr at 01/07/19 1400  . azithromycin 500 mg (01/08/19 0441)  . cefTRIAXone (ROCEPHIN)  IV 2 g (01/07/19 1350)  . levETIRAcetam 250 mg (01/08/19 0138)  . vancomycin 1,250 mg (01/08/19 0234)     LOS: 1 day      Debbe Odea, MD [facility] Pager: [email or web address] Password [email or web address] 01/08/2019, 8:21 AM

## 2019-01-08 NOTE — Progress Notes (Signed)
MEWS/VS Documentation      01/08/2019 0800 01/08/2019 1214 01/08/2019 1638 01/08/2019 1700   MEWS Score:  1  1  4  4    MEWS Score Color:  Green  Green  Red  Red   Resp:  -  18  -  (!) 22   Pulse:  -  85  -  -   BP:  -  111/72  -  -   Temp:  -  (!) 97.4 F (36.3 C)  -  -   O2 Device:  -  Room Air  -  -   Level of Consciousness:  Responds to Voice  -  -  -    Patient in process of transition to palliative care. Will have ICD turned off by [place] today and possibly transfer to [employer]

## 2019-01-08 NOTE — Progress Notes (Signed)
Patient ID: Maurice Mckee, male   DOB: August 15, 1925, 85 y.o.   MRN: [id number] Pt presented to IR dept today for right thoracentesis. On limited US rt post chest there is only a trace amount of rt pleural effusion noted, not enough to safely aspirate at this time due to close proximity to lung. Pt also agitated during exam and moving frequently. Procedure canceled.

## 2019-01-08 NOTE — Progress Notes (Signed)
Palliative Medicine RN Note: Per request of PMT NP Wadie Lessen, I followed up with pt's son Francee Piccolo to see if the family had come to any decisions about code status and deactivating AICD. He reports that the family would like the AICD deactivated, but they did not discuss code status. He has no questions for [place] now, and he will continue discussions with his family.  Plan for PMT to follow up with phone call tomorrow; he agrees with this plan.   Updated Wadie Lessen, who is off today; she gave order to deactivate AICD. Secure message sent to Dr Wynelle Cleveland.  Marjie Skiff Ashlynd Michna, RN, BSN, [facility] Palliative Medicine Team 01/08/2019 10:37 AM Office [phone]

## 2019-01-08 NOTE — Progress Notes (Addendum)
MEWS/VS Documentation      01/08/2019 2241 01/08/2019 2256 01/08/2019 2300 01/08/2019 2310   MEWS Score:  5  1  2  4    MEWS Score Color:  Red  Green  Yellow  Red   Resp:  -  16  -  -   Pulse:  -  60  95  (!) 124   BP:  (!) 83/59  (!) 182/165  99/86  (!) 90/52   Temp:  -  97.8 F (36.6 C)  -  -   O2 Device:  -  Nasal Cannula  -  Nasal Cannula   O2 Flow Rate (L/min):  -  -  -  2 L/min    Pt is transitioning to palliative care but remains a full code at this time. New change from NSR to afib. HR range 140-150's. Around 2030 NP Bodenheimer paged and returned call. Concern for pt change of status expressed and new order for 563ml bolus to be given over 2 hours and Amiodarone bolus to be given to pt.  Bolus given first in an attempt to raise pt BP before giving Amiodarone. Pt BP remained low. NP paged and call returned to discuss options and concerns of remaining low BP and elevated HR. NP gave new orders for Amiodarone drip and 1 unit of blood to be given to pt (HGB 7.0).  RN to call pt contact for consent for transfusion.  During this entire time, pt vitals checked often and pt LOC has not changed.  Pt responds to voice and although speech is hard to understand, pt attempts to hold a conversation with RN.  Will continue to monitor for changes.  Vitals to be checked Q2x2, LOC Q2, and vitals order updated to Q4.  Will continue to monitor.  @0549  Upon completion of transfusion and other ordered interventions, pt vitals have stabilized. BP 139/72 P 93.  Pt MEWS score is now green (1).  Pt has also had low urine output but bladder scan was completed showing only 89ml of urine.  Will continue to monitor.

## 2019-01-08 NOTE — Plan of Care (Signed)

## 2019-01-08 NOTE — Progress Notes (Signed)
MEWS/VS Documentation      01/08/2019 1214 01/08/2019 1638 01/08/2019 1700 01/08/2019 1803   MEWS Score:  1  4  4  4    MEWS Score Color:  Green  Red  Red  Red   Resp:  18  -  (!) 22  -   Pulse:  85  -  -  95   BP:  111/72  -  -  (!) 76/40   Temp:  (!) 97.4 F (36.3 C)  -  -  -   O2 Device:  Room Air  -  -  -    Patient is transitioning to palliative care.  [employer] rep was here to turn ICD off today.  Patient will most likely transfer back to [employer].

## 2019-01-08 NOTE — Social Work (Signed)
CSW acknowledging consult, pt from [facility]. At this time await Mio and code status ongoing conversations for determination of disposition.  Westley Hummer, MSW, [place] [employer] [phone]

## 2019-01-08 NOTE — Progress Notes (Addendum)
Initial Nutrition Assessment  RD working remotely.  DOCUMENTATION CODES:   Underweight  INTERVENTION:   -Feeding assistance with meals -D/c Boost Plus -D/c Ensure Mac -Ensure Enlive po TID, each supplement provides 350 kcal and 20 grams of protein -30 ml Prostat BID, each supplement provides 100 kcals and 15 grams protein -Continue MVI with minerals daily -Magic cup TID with meals, each supplement provides 290 kcal and 9 grams of protein  NUTRITION DIAGNOSIS:   Increased nutrient needs related to wound healing as evidenced by estimated needs.  GOAL:   Patient will meet greater than or equal to 90% of their needs  MONITOR:   PO intake, Supplement acceptance, Labs, Weight trends, Skin, I & O's  REASON FOR ASSESSMENT:   Low [place]    ASSESSMENT:   Maurice Mckee  is a 85 y.o. male with medical history significant of end stage dementia, possible seizure and was started on Keppra, HTN, CAD, CHF EF 45-50%, AICD placement.Patient presents to the ED from [facility] SNF for HR in 200s and pulse ox in 80s (per ED provider's note).  Pt admitted with HCAP.   8/19- s/p BSE- advanced to dysphagia 1 diet with thin liquids 8/20- per IR thoracentesis cancelled due to not enough effusion to safely aspiirate; per PCT notes, AICD to be deactivated  Per [facility] note on 01/07/19, pt with unstageable pressure injury to coccyx.   Pt with advanced dementia and unable to provide additional history. He is a resident of [facility]. He requires feeding assistance and is receiving this during his hospital stay. No meal completion records to assess at this time.   No new wt to assess at this time. RD used wt from 10/2018 to estimate needs. Per reviewed of wt records, pt has experienced a 12.5% wt loss over the past year, which while not significant for time frame, is concerning given advanced age, dementia, and pressure injuries. Highly suspect pt with malnutrition, however, unable to  identify at this time.   Labs reviewed: Na: 149.   Diet Order:   Diet Order            DIET - DYS 1 Room service appropriate? Yes; Fluid consistency: Thin  Diet effective now              EDUCATION NEEDS:   Not appropriate for education at this time  Skin:  Skin Assessment: Skin Integrity Issues: Skin Integrity Issues:: Stage I, Unstageable Stage I: rt elbow Unstageable: coccyx  Last BM:  Unknown  Height:   Ht Readings from Last 1 Encounters:  01/08/19 _0  (1.803 m)    Weight:   Wt Readings from Last 1 Encounters:  01/08/19 57.6 kg    Ideal Body Weight:  78.2 kg  BMI:  Body mass index is 17.71 kg/m.  Estimated Nutritional Needs:   Kcal:  1550-1750  Protein:  75-90 grams  Fluid:  > 1.5 L    Ainsleigh Kakos A. Jimmye Norman, RD, LDN, [place] Registered Dietitian II Certified Diabetes Care and Education Specialist Pager: [phone] After hours Pager: [phone]

## 2019-01-08 NOTE — Progress Notes (Signed)
Contacted son Francee Piccolo to inquire about ICD brand.  Son stated [employer].  [employer] rep contacted and will come to the hospital to turn off patient's ICD.

## 2019-01-09 DIAGNOSIS — J189 Pneumonia, unspecified organism: Principal | ICD-10-CM

## 2019-01-09 DIAGNOSIS — L89152 Pressure ulcer of sacral region, stage 2: Secondary | ICD-10-CM

## 2019-01-09 DIAGNOSIS — R627 Adult failure to thrive: Secondary | ICD-10-CM

## 2019-01-09 DIAGNOSIS — I480 Paroxysmal atrial fibrillation: Secondary | ICD-10-CM

## 2019-01-09 DIAGNOSIS — J90 Pleural effusion, not elsewhere classified: Secondary | ICD-10-CM

## 2019-01-09 DIAGNOSIS — D649 Anemia, unspecified: Secondary | ICD-10-CM

## 2019-01-09 DIAGNOSIS — F0280 Dementia in other diseases classified elsewhere without behavioral disturbance: Secondary | ICD-10-CM

## 2019-01-09 DIAGNOSIS — Z515 Encounter for palliative care: Secondary | ICD-10-CM

## 2019-01-09 LAB — BASIC METABOLIC PANEL
Anion gap: 9 (ref 5–15)
BUN: 53 mg/dL — ABNORMAL HIGH (ref 8–23)
CO2: 23 mmol/L (ref 22–32)
Calcium: 9.3 mg/dL (ref 8.9–10.3)
Chloride: 117 mmol/L — ABNORMAL HIGH (ref 98–111)
Creatinine, Ser: 1.04 mg/dL (ref 0.61–1.24)
GFR calc Af Amer: >60 mL/min (ref >60)
GFR calc non Af Amer: >60 mL/min (ref >60)
Glucose, Bld: 113 mg/dL — ABNORMAL HIGH (ref 70–99)
Potassium: 3.7 mmol/L (ref 3.5–5.1)
Sodium: 149 mmol/L — ABNORMAL HIGH (ref 135–145)

## 2019-01-09 LAB — IRON AND TIBC
Iron: 89 ug/dL (ref 45–182)
Saturation Ratios: 48 % — ABNORMAL HIGH (ref 17.9–39.5)
TIBC: 186 ug/dL — ABNORMAL LOW (ref 250–450)
UIBC: 97 ug/dL

## 2019-01-09 LAB — RETICULOCYTES
Immature Retic Fract: 30.5 % — ABNORMAL HIGH (ref 2.3–15.9)
RBC.: 3.12 MIL/uL — ABNORMAL LOW (ref 4.22–5.81)
Retic Count, Absolute: 83.3 10*3/uL (ref 19.0–186.0)
Retic Ct Pct: 2.7 % (ref 0.4–3.1)

## 2019-01-09 LAB — VITAMIN B12: Vitamin B-12: 745 pg/mL (ref 180–914)

## 2019-01-09 LAB — CBC
HCT: 27.9 % — ABNORMAL LOW (ref 39.0–52.0)
Hemoglobin: 8.8 g/dL — ABNORMAL LOW (ref 13.0–17.0)
MCH: 28.2 pg (ref 26.0–34.0)
MCHC: 31.5 g/dL (ref 30.0–36.0)
MCV: 89.4 fL (ref 80.0–100.0)
Platelets: 239 10*3/uL (ref 150–400)
RBC: 3.12 MIL/uL — ABNORMAL LOW (ref 4.22–5.81)
RDW: 16.8 % — ABNORMAL HIGH (ref 11.5–15.5)
WBC: 21.6 10*3/uL — ABNORMAL HIGH (ref 4.0–10.5)
nRBC: 0.1 % (ref 0.0–0.2)

## 2019-01-09 LAB — FERRITIN: Ferritin: 136 ng/mL (ref 24–336)

## 2019-01-09 LAB — MRSA PCR SCREENING: MRSA by PCR: POSITIVE — AB

## 2019-01-09 LAB — PREPARE RBC (CROSSMATCH)

## 2019-01-09 LAB — FOLATE: Folate: 15.7 ng/mL (ref >5.9)

## 2019-01-09 MED ORDER — HALOPERIDOL 0.5 MG PO TABS
0.5000 mg | ORAL_TABLET | ORAL | Status: DC | PRN
  Filled 2019-01-09: qty 1

## 2019-01-09 MED ORDER — BIOTENE DRY MOUTH MT LIQD: 15.0000 mL | OROMUCOSAL | Status: DC | PRN

## 2019-01-09 MED ORDER — GLYCOPYRROLATE 0.2 MG/ML IJ SOLN: 0.2000 mg | INTRAMUSCULAR | Status: DC | PRN

## 2019-01-09 MED ORDER — ACETAMINOPHEN 325 MG PO TABS: 650.0000 mg | ORAL_TABLET | Freq: Four times a day (QID) | ORAL | Status: DC | PRN

## 2019-01-09 MED ORDER — CHLORHEXIDINE GLUCONATE CLOTH 2 % EX PADS
6.0000 | MEDICATED_PAD | Freq: Every day | CUTANEOUS | Status: DC
  Administered 2019-01-09 – 2019-01-12 (×3): 6 via TOPICAL

## 2019-01-09 MED ORDER — MUPIROCIN 2 % EX OINT
TOPICAL_OINTMENT | Freq: Two times a day (BID) | CUTANEOUS | Status: DC
  Administered 2019-01-09 – 2019-01-12 (×5): via NASAL
  Filled 2019-01-09: qty 22

## 2019-01-09 MED ORDER — VANCOMYCIN HCL IN DEXTROSE 1-5 GM/200ML-% IV SOLN: 1000.0000 mg | INTRAVENOUS | Status: DC

## 2019-01-09 MED ORDER — ONDANSETRON 4 MG PO TBDP: 4.0000 mg | ORAL_TABLET | Freq: Four times a day (QID) | ORAL | Status: DC | PRN

## 2019-01-09 MED ORDER — LEVETIRACETAM 250 MG PO TABS
250.0000 mg | ORAL_TABLET | Freq: Two times a day (BID) | ORAL | Status: DC
  Administered 2019-01-10 – 2019-01-12 (×5): 250 mg via ORAL
  Filled 2019-01-09 (×6): qty 1

## 2019-01-09 MED ORDER — HALOPERIDOL LACTATE 2 MG/ML PO CONC
0.5000 mg | ORAL | Status: DC | PRN
  Filled 2019-01-09: qty 0.3

## 2019-01-09 MED ORDER — GLYCOPYRROLATE 1 MG PO TABS
1.0000 mg | ORAL_TABLET | ORAL | Status: DC | PRN
  Filled 2019-01-09: qty 1

## 2019-01-09 MED ORDER — ONDANSETRON HCL 4 MG/2ML IJ SOLN: 4.0000 mg | Freq: Four times a day (QID) | INTRAMUSCULAR | Status: DC | PRN

## 2019-01-09 MED ORDER — POLYVINYL ALCOHOL 1.4 % OP SOLN
1.0000 [drp] | Freq: Four times a day (QID) | OPHTHALMIC | Status: DC | PRN
  Filled 2019-01-09: qty 15

## 2019-01-09 MED ORDER — ACETAMINOPHEN 650 MG RE SUPP: 650.0000 mg | Freq: Four times a day (QID) | RECTAL | Status: DC | PRN

## 2019-01-09 MED ORDER — ADULT MULTIVITAMIN W/MINERALS CH: 1.0000 | ORAL_TABLET | Freq: Every day | ORAL | Status: DC

## 2019-01-09 MED ORDER — HALOPERIDOL LACTATE 5 MG/ML IJ SOLN: 0.5000 mg | INTRAMUSCULAR | Status: DC | PRN

## 2019-01-09 MED ORDER — MORPHINE SULFATE (PF) 2 MG/ML IV SOLN: 2.0000 mg | INTRAVENOUS | Status: DC | PRN

## 2019-01-09 NOTE — Progress Notes (Signed)
Examined patient.  Spoke with bedside RN.  Patient has not eaten today or taken any of his medications.  PE  Cachectic frail male, in fetal position, does not open eyes or speak. CV rrr resp no distress Abdomen thin, nt, nd Ext without edema.  Spoke with son Cyndie Chime on the phone.  He confirmed DNR/DNI.  Cyndie Chime understands that his father's dementia has progressed and he is nearing EOL.  Cyndie Chime feels it may not make sense to send  Him to a SNF again.  We discussed [facility].  Rodger asked several questions.  He was concerned his father would starve to death.  We talked about the natural process of dying and the fact that at a certain point your body can no longer digest and utilize food.  Cyndie Chime was accepting of this information.    Cyndie Chime explains that he is one of  5 brothers and sisters.  He needs to speak with them about next steps and get back to me.  I offered to answer any questions they may have and speak with them directly.  Rodger committed to be back in touch with me today or tomorrow with a decision regarding [place].    We discussed discontinuing some measures now in an attempt improve his father's comfort -such as labs, vitamins, etc...  Florentina Jenny, PA-C Palliative Medicine Pager: [phone]  35 min.  Greater than 50%  of this time was spent counseling and coordinating care related to the above assessment and plan

## 2019-01-09 NOTE — Progress Notes (Signed)
[facility] to inform us of the referral. We unfortunately do not have a bed to offer as of now due to at capacity. We will contact son and let him know and will follow up as soon as one becomes available.  Please contact us at [phone] if you have questions. Webb Silversmith RN

## 2019-01-09 NOTE — Progress Notes (Addendum)
PROGRESS NOTE    Uziah Orender   [id number]  DOB: Aug 16, 1925  DOA: 01/07/2019 PCP: Patient, No Pcp Per   Brief Narrative:  Hannah Westland is a 85 y.o.malewith medical history significant ofend stage dementia, possible seizure and was started on Keppra, HTN, CAD, CHF EF 45-50%, AICD placement. Patient presents to the ED from [facility] for HR in 200s and pulse ox in 80s (per ED provider's note).  Placed on 2 L O2 in ED. Per son, the patient was on antibiotics for a pneumonia for 3- 4 days.   Subjective: No complaints. Events of last night noted.     Assessment & Plan:  Principal Problem:  HCAP (healthcare-associated pneumonia), acute respiratory failure with right pleural effusion - ? Aspiration- MRSA PCR +  - was on Levaquin as outpt- now on Vanc, Ceftraixone and Azithromycin - IR consulted to drain pleural effusion but ultrasound by IR yesterday showed there was not enough fluid to tap  - palliative care consulted in June >  family wanted aggressive measures - SLP eval > Dys 1 diet with thin liquids  Active Problems: A-fib with RVR last night - Hypotension - A-fib with rates in 140s associated with hypotension - given a bolus of NS and Amiodarone but no improvement in rate noted - Amiodarone infusion subsequently started and given 1 U PRBC - he had converted back to NSR - cont Amio infusion - as BP has improved, will increase Coreg today  - hold off on full anticoagulation- can continue Plavix    Chronic systolic congestive heart failure  - no ECHO in Epic   Anemia - Hb dropping from 10 to 7.0 on 8/20- no acute bleeding- likely is dilutional- given 1 U PRBC overnight when he was hypotensive -  check anemia panel     Severe Dementia with behavioral disturbance - only knows his name, speech nonsensical at times- often does not follow commands - needs total care - supportive care- on Remeron    H/o possible seizure in June - cont Keppra    ICD  (implantable cardioverter-defibrillator) in place    Adult failure to thrive - cont aggressive measures per family     Sacral decubitus ulcer, stage II ([place]) - wound care consulted and recommendations noted   Time spent in minutes: 35 DVT prophylaxis: Lovenox Code Status: DNR Family Communication:  Disposition Plan: return to SNF Consultants:   none Procedures:   none Antimicrobials:  Anti-infectives (From admission, onward)   Start     Dose/Rate Route Frequency Ordered Stop   01/08/19 0200  vancomycin (VANCOCIN) 1,250 mg in sodium chloride 0.9 % 250 mL IVPB     1,250 mg 166.7 mL/hr over 90 Minutes Intravenous Every 24 hours 01/07/19 0648     01/07/19 1400  cefTRIAXone (ROCEPHIN) 2 g in sodium chloride 0.9 % 100 mL IVPB     2 g 200 mL/hr over 30 Minutes Intravenous Every 24 hours 01/07/19 0317 01/12/19 1359   01/07/19 0400  azithromycin (ZITHROMAX) 500 mg in sodium chloride 0.9 % 250 mL IVPB     500 mg 250 mL/hr over 60 Minutes Intravenous Every 24 hours 01/07/19 0317 01/12/19 0359   01/07/19 0230  vancomycin (VANCOCIN) 1,250 mg in sodium chloride 0.9 % 250 mL IVPB     1,250 mg 166.7 mL/hr over 90 Minutes Intravenous  Once 01/07/19 0203 01/07/19 0415   01/07/19 0200  vancomycin (VANCOCIN) IVPB 1000 mg/200 mL premix  Status:  Discontinued  1,000 mg 200 mL/hr over 60 Minutes Intravenous  Once 01/07/19 0153 01/07/19 0203   01/07/19 0200  ceFEPIme (MAXIPIME) 2 g in sodium chloride 0.9 % 100 mL IVPB     2 g 200 mL/hr over 30 Minutes Intravenous  Once 01/07/19 0153 01/07/19 0319       Objective: Vitals:   01/09/19 0305 01/09/19 0508 01/09/19 0549 01/09/19 0646  BP: (!) 99/58 111/66 139/72   Pulse: (!) 120 88 81   Resp: 19 16 (!) 21 16  Temp: 97.8 F (36.6 C) 98.6 F (37 C) 97.9 F (36.6 C)   TempSrc: Oral  Oral   SpO2: 90%  91%   Weight:      Height:        Intake/Output Summary (Last 24 hours) at 01/09/2019 0849 Last data filed at 01/09/2019 [id number] Gross per  24 hour  Intake 1725.82 ml  Output --  Net 1725.82 ml   Filed Weights   01/08/19 1334  Weight: 57.6 kg    Examination: General exam: Appears comfortable  HEENT: PERRLA, oral mucosa moist, no sclera icterus or thrush Respiratory system: Clear to auscultation. Respiratory effort normal. Cardiovascular system: S1 & S2 heard,  No murmurs  Gastrointestinal system: Abdomen soft, non-tender, nondistended. Normal bowel sounds   Central nervous system: Alert and oriented only to person. No focal neurological deficits. Extremities: No cyanosis, clubbing or edema Skin: No rashes or ulcers Psychiatry:  Mood & affect appropriate.     Data Reviewed: I have personally reviewed following labs and imaging studies  CBC: Recent Labs  Lab 01/07/19 0111 01/08/19 0745 01/09/19 0808  WBC 17.7* 14.1* 21.6*  NEUTROABS 13.1*  --   --   HGB 10.1* 7.0* 8.8*  HCT 33.2* 23.2* 27.9*  MCV 90.2 91.3 89.4  PLT 248 220 [id number]   Basic Metabolic Panel: Recent Labs  Lab 01/07/19 0111 01/08/19 0745  NA 146* 149*  K 3.8 3.6  CL 112* 118*  CO2 25 24  GLUCOSE 141* 108*  BUN 37* 42*  CREATININE 0.96 0.69  CALCIUM 9.5 9.1  MG 1.9  --    GFR: Estimated Creatinine Clearance: 49 mL/min (by C-G formula based on SCr of 0.69 mg/dL). Liver Function Tests: Recent Labs  Lab 01/07/19 0111  AST 18  ALT 25  ALKPHOS 80  BILITOT 0.4  PROT 5.5*  ALBUMIN 2.2*   No results for input(s): LIPASE, AMYLASE in the last 168 hours. No results for input(s): AMMONIA in the last 168 hours. Coagulation Profile: No results for input(s): INR, PROTIME in the last 168 hours. Cardiac Enzymes: No results for input(s): CKTOTAL, CKMB, CKMBINDEX, TROPONINI in the last 168 hours. BNP (last 3 results) No results for input(s): PROBNP in the last 8760 hours. HbA1C: No results for input(s): HGBA1C in the last 72 hours. CBG: No results for input(s): GLUCAP in the last 168 hours. Lipid Profile: No results for input(s): CHOL,  HDL, LDLCALC, TRIG, CHOLHDL, LDLDIRECT in the last 72 hours. Thyroid Function Tests: No results for input(s): TSH, T4TOTAL, FREET4, T3FREE, THYROIDAB in the last 72 hours. Anemia Panel: Recent Labs    01/09/19 0808  RETICCTPCT 2.7   Urine analysis:    Component Value Date/Time   COLORURINE YELLOW 01/07/2019 [place] 01/07/2019 0245   LABSPEC 1.023 01/07/2019 0245   PHURINE 5.0 01/07/2019 0245   GLUCOSEU NEGATIVE 01/07/2019 0245   [place] NEGATIVE 01/07/2019 [place] NEGATIVE 01/07/2019 0245   KETONESUR NEGATIVE 01/07/2019 0245  PROTEINUR NEGATIVE 01/07/2019 0245   NITRITE NEGATIVE 01/07/2019 0245   LEUKOCYTESUR NEGATIVE 01/07/2019 0245   Sepsis Labs: @LABRCNTIP (procalcitonin:4,lacticidven:4) ) Recent Results (from the past 240 hour(s))  Culture, blood (Routine X 2) w Reflex to ID Panel     Status: None (Preliminary result)   Collection Time: 01/07/19  2:30 AM   Specimen: BLOOD LEFT FOREARM  Result Value Ref Range Status   Specimen Description BLOOD LEFT FOREARM  Final   Special Requests   Final    BOTTLES DRAWN AEROBIC AND ANAEROBIC Blood Culture results may not be optimal due to an inadequate volume of blood received in culture bottles   Culture  Setup Time   Final    ANAEROBIC BOTTLE ONLY GRAM POSITIVE RODS CRITICAL RESULT CALLED TO, READ BACK BY AND VERIFIED WITH: J MILLEN PHARMD 01/07/19 1953 JDW AEROBIC BOTTLE ONLY GRAM POSITIVE COCCI Organism ID to follow CRITICAL RESULT CALLED TO, READ BACK BY AND VERIFIED WITH: PHRMD @0536  01/08/19 BY S GEZAHEGN Performed at [facility], [place] [address] [place], [place] [address]    Culture GRAM POSITIVE RODS  Final   Report Status PENDING  Incomplete  Culture, blood (Routine X 2) w Reflex to ID Panel     Status: None (Preliminary result)   Collection Time: 01/07/19  2:30 AM   Specimen: BLOOD LEFT FOREARM  Result Value Ref Range Status   Specimen Description BLOOD LEFT FOREARM  Final   Special  Requests   Final    BOTTLES DRAWN AEROBIC ONLY Blood Culture results may not be optimal due to an inadequate volume of blood received in culture bottles   Culture   Final    NO GROWTH 2 DAYS Performed at [facility], [place] [address] [place], [place] [address]    Report Status PENDING  Incomplete  Blood Culture ID Panel (Reflexed)     Status: Abnormal   Collection Time: 01/07/19  2:30 AM  Result Value Ref Range Status   Enterococcus species NOT DETECTED NOT DETECTED Final   Listeria monocytogenes NOT DETECTED NOT DETECTED Final   Staphylococcus species DETECTED (A) NOT DETECTED Final    Comment: Methicillin (oxacillin) resistant coagulase negative staphylococcus. Possible blood culture contaminant (unless isolated from more than one blood culture draw or clinical case suggests pathogenicity). No antibiotic treatment is indicated for blood  culture contaminants. CRITICAL RESULT CALLED TO, READ BACK BY AND VERIFIED WITH: PHRMD @0536  01/08/19 BY S GEZAHEGN    Staphylococcus aureus (BCID) NOT DETECTED NOT DETECTED Final   Methicillin resistance DETECTED (A) NOT DETECTED Final    Comment: CRITICAL RESULT CALLED TO, READ BACK BY AND VERIFIED WITH: PHRMD @0536  01/08/19 BY S GEZAHEGN    Streptococcus species NOT DETECTED NOT DETECTED Final   Streptococcus agalactiae NOT DETECTED NOT DETECTED Final   Streptococcus pneumoniae NOT DETECTED NOT DETECTED Final   Streptococcus pyogenes NOT DETECTED NOT DETECTED Final   Acinetobacter baumannii NOT DETECTED NOT DETECTED Final   Enterobacteriaceae species NOT DETECTED NOT DETECTED Final   Enterobacter cloacae complex NOT DETECTED NOT DETECTED Final   Escherichia coli NOT DETECTED NOT DETECTED Final   Klebsiella oxytoca NOT DETECTED NOT DETECTED Final   Klebsiella pneumoniae NOT DETECTED NOT DETECTED Final   Proteus species NOT DETECTED NOT DETECTED Final   Serratia marcescens NOT DETECTED NOT DETECTED Final   Haemophilus influenzae NOT DETECTED  NOT DETECTED Final   Neisseria meningitidis NOT DETECTED NOT DETECTED Final   Pseudomonas aeruginosa NOT DETECTED NOT DETECTED Final  Candida albicans NOT DETECTED NOT DETECTED Final   Candida glabrata NOT DETECTED NOT DETECTED Final   Candida krusei NOT DETECTED NOT DETECTED Final   Candida parapsilosis NOT DETECTED NOT DETECTED Final   Candida tropicalis NOT DETECTED NOT DETECTED Final    Comment: Performed at [facility], [place] [address] [place], [place] [address]  SARS Coronavirus 2 [facility] order, Performed in [facility] hospital lab) Nasopharyngeal     Status: None   Collection Time: 01/07/19  2:47 AM   Specimen: Nasopharyngeal  Result Value Ref Range Status   SARS Coronavirus 2 NEGATIVE NEGATIVE Final    Comment: (NOTE) If result is NEGATIVE SARS-CoV-2 target nucleic acids are NOT DETECTED. The SARS-CoV-2 RNA is generally detectable in upper and lower  respiratory specimens during the acute phase of infection. The lowest  concentration of SARS-CoV-2 viral copies this assay can detect is 250  copies / mL. A negative result does not preclude SARS-CoV-2 infection  and should not be used as the sole basis for treatment or other  patient management decisions.  A negative result may occur with  improper specimen collection / handling, submission of specimen other  than nasopharyngeal swab, presence of viral mutation(s) within the  areas targeted by this assay, and inadequate number of viral copies  (<250 copies / mL). A negative result must be combined with clinical  observations, patient history, and epidemiological information. If result is POSITIVE SARS-CoV-2 target nucleic acids are DETECTED. The SARS-CoV-2 RNA is generally detectable in upper and lower  respiratory specimens dur ing the acute phase of infection.  Positive  results are indicative of active infection with SARS-CoV-2.  Clinical  correlation with patient history and other diagnostic information is    necessary to determine patient infection status.  Positive results do  not rule out bacterial infection or co-infection with other viruses. If result is PRESUMPTIVE POSTIVE SARS-CoV-2 nucleic acids MAY BE PRESENT.   A presumptive positive result was obtained on the submitted specimen  and confirmed on repeat testing.  While 2019 novel coronavirus  (SARS-CoV-2) nucleic acids may be present in the submitted sample  additional confirmatory testing may be necessary for epidemiological  and / or clinical management purposes  to differentiate between  SARS-CoV-2 and other Sarbecovirus currently known to infect humans.  If clinically indicated additional testing with an alternate test  methodology [phone]) is advised. The SARS-CoV-2 RNA is generally  detectable in upper and lower respiratory sp ecimens during the acute  phase of infection. The expected result is Negative. Fact Sheet for Patients:  StrictlyIdeas.no Fact Sheet for Healthcare Providers: BankingDealers.co.za This test is not yet approved or cleared by the [place] FDA and has been authorized for detection and/or diagnosis of SARS-CoV-2 by FDA under an Emergency Use Authorization (EUA).  This EUA will remain in effect (meaning this test can be used) for the duration of the COVID-19 declaration under Section 564(b)(1) of the Act, 21 U.S.C. section 360bbb-3(b)(1), unless the authorization is terminated or revoked sooner. Performed at [facility], [place] [address] [place], [place] [address]   MRSA PCR Screening     Status: Abnormal   Collection Time: 01/07/19  3:35 AM   Specimen: Nasal Mucosa; Nasopharyngeal  Result Value Ref Range Status   MRSA by PCR POSITIVE (A) NEGATIVE Final    Comment:        The GeneXpert MRSA Assay (FDA approved for NASAL specimens only), is one component of a comprehensive MRSA colonization surveillance  program. It is not intended to  diagnose MRSA infection nor to guide or monitor treatment for MRSA infections. RESULT CALLED TO, READ BACK BY AND VERIFIED WITH: [facility] RN [id number] 01/07/2019 MITCHELL,L Performed at [facility], [place] [address] [place],  [address]          Radiology Studies: Ir US Chest  Result Date: 01/08/2019 CLINICAL DATA:  85 year old with altered mental status and request for thoracentesis. EXAM: CHEST ULTRASOUND COMPARISON:  Chest radiograph 01/07/2019 FINDINGS: Small amount of right pleural fluid is present. Could not identify a safe percutaneous window for thoracentesis. IMPRESSION: Small right pleural effusion.  Thoracentesis not performed. Electronically Signed   By: Markus Daft M.D.   On: 01/08/2019 14:34      Scheduled Meds:  aspirin EC  325 mg Oral Daily   atorvastatin  40 mg Oral q1800   carvedilol  12.5 mg Oral BID WC   Chlorhexidine Gluconate Cloth  6 each Topical Q0600   cholecalciferol  1,000 Units Oral Daily   clopidogrel  75 mg Oral Daily   collagenase   Topical Daily   docusate  100 mg Oral BID   enoxaparin (LOVENOX) injection  40 mg Subcutaneous Q24H   feeding supplement (ENSURE ENLIVE)  237 mL Oral TID WC   feeding supplement (PRO-STAT SUGAR FREE 64)  30 mL Oral BID   ferrous sulfate  325 mg Oral Daily   Melatonin  3 mg Oral QHS   mirtazapine  7.5 mg Oral QHS   multivitamin with minerals  1 tablet Oral Q breakfast   mupirocin ointment   Nasal BID   Continuous Infusions:  amiodarone 30 mg/hr (01/09/19 0605)   azithromycin 500 mg (01/09/19 0601)   cefTRIAXone (ROCEPHIN)  IV 2 g (01/08/19 1506)   levETIRAcetam 250 mg (01/09/19 0141)   sodium chloride     vancomycin 1,250 mg (01/09/19 0420)     LOS: 2 days      Debbe Odea, MD [facility] Pager: [email or web address] Password [facility] 01/09/2019, 8:49 AM

## 2019-01-09 NOTE — Progress Notes (Signed)
Pharmacy Antibiotic Note  Maurice Mckee is a 85 y.o. male admitted on 01/07/2019 with Sepsis, PNA.  Pharmacy has been consulted for Vancomycin dosing.  ID: Empiric abx for pneumonia. LA and PC WNL on admit - Afebrile. WBC 17.7>14.1>21.6, Scr 0.69>1.04 up  8/19: COVID: negative 8/19: MRSA PCR: positive 8/19: BC x 2: GPC x 1 8/19: BCID: MRSE  Cefepime 8/19 x 1 Azithro 8/19>8/24 x 5d Rocephin8/19>8/24 x 5d Vanco 8/19>>  8/20: Vanco 1250mg  IV q24h started with no calculations done. 8/21: Decrease Vanco to 1g/24h with expected AUC 532.3 and increased Scr 1.04.   Plan: -Decrease Vancomycin 1000 mg IV q24h -Ceftriaxone 2 g I V q24h x5d - Azithro x 5d - Adjust Vancomycin if Scr trending up again. - Resume FESO4 from PTA?    Height: 5\' 11"  (180.3 cm) Weight: 126 lb 15.8 oz (57.6 kg)(from 11/17/18 encounter) IBW/kg (Calculated) : 75.3  Temp (24hrs), Avg:97.8 F (36.6 C), Min:97.4 F (36.3 C), Max:98.6 F (37 C)  Recent Labs  Lab 01/07/19 0111 01/07/19 0230 01/08/19 0745 01/09/19 0808  WBC 17.7*  --  14.1* 21.6*  CREATININE 0.96  --  0.69 1.04  LATICACIDVEN  --  1.2  --   --     Estimated Creatinine Clearance: 37.7 mL/min (by C-G formula based on SCr of 1.04 mg/dL).    Allergies  Allergen Reactions  . Levaquin [Levofloxacin] Other (See Comments)    Seizures!  . Lactose Intolerance (Gi) Other (See Comments)    "Allergic," per [facility]    Sebasthian Stailey S. Alford Highland, PharmD, BCPS Clinical Staff Pharmacist Eilene Ghazi [place] 01/09/2019 9:35 AM

## 2019-01-09 NOTE — Progress Notes (Signed)
Patient refused his medications. OT was at bedside and tried make him drink or swallow but patient refused.

## 2019-01-09 NOTE — Progress Notes (Signed)
Nutrition Follow-up  DOCUMENTATION CODES:   Severe malnutrition in context of chronic illness, Underweight  INTERVENTION:   -Continue feeding assistance with meals -Continue Ensure Enlive po TID, each supplement provides 350 kcal and 20 grams of protein -Continue 30 ml Prostat BID, each supplement provides 100 kcals and 15 grams protein -Continue Magic cup TID with meals, each supplement provides 290 kcal and 9 grams of protein  NUTRITION DIAGNOSIS:   Severe Malnutrition related to chronic illness(dementia) as evidenced by severe fat depletion, severe muscle depletion.  Ongoing  GOAL:   Patient will meet greater than or equal to 90% of their needs  Progressing   MONITOR:   PO intake, Supplement acceptance, Labs, Weight trends, Skin, I & O's  REASON FOR ASSESSMENT:   Low [place]    ASSESSMENT:   Maurice Mckee  is a 85 y.o. male with medical history significant of end stage dementia, possible seizure and was started on Keppra, HTN, CAD, CHF EF 45-50%, AICD placement.Patient presents to the ED from [facility] SNF for HR in 200s and pulse ox in 80s (per ED provider's note).  8/19- s/p BSE- advanced to dysphagia 1 diet with thin liquids 8/20- per IR thoracentesis cancelled due to not enough effusion to safely aspirate; per PCT notes, AICD to be deactivated  Reviewed I/O's: +1.7L x 24 hours and +4.3 L since admission  Pt lying bed at time of visit and did not respond to touch or RD voice. Noted breakfast tray has been unattempted. Pt has been accepting Ensure and Prostat supplements. No meal completion data documented to assess.   Per MD notes, plan no further escalation of care, but continue current measures at this time. Plan to d/c back to SNF once medically optimized.  Labs reviewed: Na: 149.   Diet Order:   Diet Order            DIET - DYS 1 Room service appropriate? Yes; Fluid consistency: Thin  Diet effective now              EDUCATION NEEDS:   Not  appropriate for education at this time  Skin:  Skin Assessment: Skin Integrity Issues: Skin Integrity Issues:: Stage I, Unstageable Stage I: rt elbow Unstageable: coccyx  Last BM:  Unknown  Height:   Ht Readings from Last 1 Encounters:  01/08/19 5\' 11"  (1.803 m)    Weight:   Wt Readings from Last 1 Encounters:  01/08/19 57.6 kg    Ideal Body Weight:  78.2 kg  BMI:  Body mass index is 17.71 kg/m.  Estimated Nutritional Needs:   Kcal:  1550-1750  Protein:  75-90 grams  Fluid:  > 1.5 L    Charleston Hankin A. Jimmye Norman, RD, LDN, [place] Registered Dietitian II Certified Diabetes Care and Education Specialist Pager: [phone] After hours Pager: [phone]

## 2019-01-09 NOTE — Progress Notes (Signed)
Paged by bedside RN regarding pt converting to A. fib with RVR heart rate into the 140s.  Patient was also hypotensive with a systolic blood pressure in the 90s.  Initially treated patient with 500 mL normal saline bolus and a amiodarone bolus as well.  Patient did not respond to either interventions heart rate was still in the [id number] and systolic blood pressure into the 80s.  Hemoglobin had dropped from 10-7 so 1 unit of PRBCs was ordered and the patient was started on an amiodarone drip.  Patient blood pressure continued to drop systolic blood pressure now in the 70s and heart rate continued to be into the 140s.  Patient in no apparent distress still alert and follows basic commands.  LS with fine crackles bilaterally patient now requiring 2L of O2 as well.  Spoke with patient's son Edilson Gan and discussed patients CODE STATUS and escalation of care.  After thoroughly discussing the current treatments being provided and the patient's poor prognosis the decision was made along with the patients other children to make the patient a DNR and to not escalate care but continue all current interventions.  We will transfuse 1 unit of blood and continue amiodarone drip.  We will continue to monitor and palliative care already following along with patient.  Arby Barrette APRN-C [facility] Pager [phone]

## 2019-01-09 NOTE — Plan of Care (Signed)
  Problem: Health Behavior/Discharge Planning: Goal: Ability to manage health-related needs will improve Outcome: Progressing   Problem: Nutrition: Goal: Adequate nutrition will be maintained Outcome: Not Progressing

## 2019-01-09 NOTE — TOC Initial Note (Signed)
Transition of Care [facility]) - Initial/Assessment Note    Patient Details  Name: Maurice Mckee MRN: [id number] Date of Birth: [birth year]  Transition of Care [facility]) CM/SW Contact:    Gelene Mink, [place] Phone Number: 01/09/2019, 2:41 PM  Clinical Narrative:                  CSW received a phone call from [facility] with palliative medicine. She stated that the patient's family would like to transition him to residential hospice. The patient's family would like to use [facility] or [facility].  CSW called Sherrie with [facility] and gave her the referral information. She stated that they did not have any beds available today, but could have one open up on Saturday. She stated that [facility] Shadow would be covering this weekend.   Sherrie will be reaching out to the family to complete hospice paperwork.   CSW called and spoke with the patient's son, Francee Piccolo. He stated that he would like his father to transition to residential hospice. CSW informed him that she made the referral with [facility]. CSW shared that they did not have any beds available today but could have one open up over the weekend. CSW provided her contact information.   CSW will need to follow up with Darlene on Saturday for bed placement.   Expected Discharge Plan: [place] Barriers to Discharge: Hospice Bed not available   Patient Goals and CMS Choice Patient states their goals for this hospitalization and ongoing recovery are:: Pt will transition to comfort care CMS Medicare.gov Compare Post Acute Care list provided to:: Patient Represenative (must comment) Choice offered to / list presented to : Adult Children  Expected Discharge Plan and Services Expected Discharge Plan: [place] In-house Referral: Clinical Social Work Discharge Planning Services: NA Post Acute Care Choice: Hospice Living arrangements for the past 2 months: [place]          DME Arranged: N/A DME Agency: NA       HH Arranged: NA [place] Agency: NA        Prior Living Arrangements/Services Living arrangements for the past 2 months: [place] Lives with:: Facility Resident Patient language and need for interpreter reviewed:: No Do you feel safe going back to the place where you live?: No      Need for Family Participation in Patient Care: Yes (Comment) Care giver support system in place?: Yes (comment)   Criminal Activity/Legal Involvement Pertinent to Current Situation/Hospitalization: No - Comment as needed  Activities of Daily Living      Permission Sought/Granted Permission sought to share information with : Case Manager Permission granted to share information with : Yes, Verbal Permission Granted  Share Information with NAME: Francee Piccolo  Permission granted to share info w AGENCY: Hospice Facilities  Permission granted to share info w Relationship: Son     Emotional Assessment Appearance:: Appears stated age Attitude/Demeanor/Rapport: Unable to Assess Affect (typically observed): Unable to Assess Orientation: : Fluctuating Orientation (Suspected and/or reported Sundowners) Alcohol / Substance Use: Not Applicable Psych Involvement: No (comment)  Admission diagnosis:  Pleural effusion [J90] HCAP (healthcare-associated pneumonia) [J18.9] Patient Active Problem List   Diagnosis Date Noted  . Paroxysmal atrial fibrillation (HCC)   . Anemia   . Pleural effusion   . Dementia associated with other underlying disease without behavioral disturbance ([place])   . Palliative care encounter   . HCAP (healthcare-associated pneumonia) 01/07/2019  . Parapneumonic effusion 01/07/2019  .  Sacral decubitus ulcer, stage II ([place]) 01/07/2019  . Adult failure to thrive   . Hypotension   . Near syncope   . Agitation   . Altered mental status, unspecified 11/12/2018  . Hypoxia   . Protein-calorie malnutrition, severe 10/16/2018  .  Pneumothorax   . Pressure injury of skin 10/11/2018  . Palliative care by specialist   . Hip fracture ([place]) 10/10/2018  . Multiple falls 03/18/2018  . Fracture of inferior pubic ramus ([place]) 03/18/2018  . Delirium 03/18/2018  . BPH (benign prostatic hyperplasia) 03/18/2018  . Urinary urgency 06/21/2017  . Spinal stenosis of lumbar region 05/26/2017  . Fracture of two ribs of right side, closed, initial encounter 01/25/2017  . Anemia, normocytic normochromic 11/09/2016  . Non-seasonal allergic rhinitis 02/15/2016  . Risk for falls 11/16/2015  . Vision problem 11/16/2015  . Vitamin D deficiency 07/29/2015  . Urinary incontinence 07/08/2015  . Pulmonary emphysema ([place]) 11/26/2014  . 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 Primary osteoarthritis of right hip 08/30/2014  . Hyperlipidemia 05/10/2014  . Right hip pain 12/24/2013  . Enlarged prostate without lower urinary tract symptoms (luts) 12/24/2013  . Hyperglycemia 12/24/2013  . Numbness 12/24/2013  . Chronic systolic congestive heart failure ([place]) 06/11/2013  . Congestive heart failure ([place]) 06/11/2013  . Dementia with behavioral disturbance ([place]) 06/11/2013  . Essential hypertension 06/11/2013  . Primary cardiomyopathy ([place]) 06/11/2013  . Abnormal glucose 03/02/2013  . Diarrhea 03/02/2013  . Insomnia 03/02/2013  . Abnormality of gait 10/25/2012  . DNR (do not resuscitate) discussion 10/25/2012  . Generalized osteoarthritis of multiple sites 10/25/2012  . Skin sensation disturbance 10/25/2012  . Backache 08/20/2011  . Other allergy, other than to medicinal agents 01/19/2010   PCP:  Patient, No Pcp Per Pharmacy:  No Pharmacies Listed    Social Determinants of Health (SDOH) Interventions    Readmission Risk Interventions No flowsheet data found.

## 2019-01-09 NOTE — Progress Notes (Signed)
  Speech Language Pathology Treatment: Dysphagia  Patient Details Name: Maurice Mckee MRN: [id number] DOB: 1926/04/12 Today's Date: 01/09/2019 Time: [id number] SLP Time Calculation (min) (ACUTE ONLY): 8 min  Assessment / Plan / Recommendation Clinical Impression  Pt reportedly has not accepted Po today. Repositioned pt, provided gentle encouragement and explanation, verbal and tactile cues. Pt resisted efforts and verbally reported he did not want anything. Given DNR status and plan transitioning more to comfort, will continue current diet and sign off as SLP interventions are no longer appropriate.   HPI HPI: 85 y.o. male with medical history significant for end stage dementia, possible seizure and was started on Keppra, HTN, CAD, CHF EF 45-50%, AICD placement, [facility] with tachycardia and hypoxia.  Dx HCAP, FTT.       SLP Plan  Discharge SLP treatment due to (comment)       Recommendations  Diet recommendations: Dysphagia 1 (puree);Thin liquid Liquids provided via: Cup;Straw Medication Administration: Crushed with puree Supervision: Staff to assist with self feeding                Follow up Recommendations: None SLP Visit Diagnosis: Dysphagia, unspecified (R13.10) Plan: Discharge SLP treatment due to (comment)       GO               Maurice [place], MA [place] Pager [phone] Office [phone]  Maurice Mckee 01/09/2019, 11:36 AM

## 2019-01-10 LAB — TYPE AND SCREEN
ABO/RH(D): A POS
Antibody Screen: NEGATIVE
Unit division: 0

## 2019-01-10 LAB — CULTURE, BLOOD (ROUTINE X 2)

## 2019-01-10 LAB — BPAM RBC
Blood Product Expiration Date: 202009092359
ISSUE DATE / TIME: 202008210241
Unit Type and Rh: 6200

## 2019-01-10 MED ORDER — AZITHROMYCIN 500 MG PO TABS
500.0000 mg | ORAL_TABLET | Freq: Every day | ORAL | Status: DC
  Administered 2019-01-10 – 2019-01-12 (×3): 500 mg via ORAL
  Filled 2019-01-10 (×3): qty 1

## 2019-01-10 MED ORDER — CEFDINIR 300 MG PO CAPS
300.0000 mg | ORAL_CAPSULE | Freq: Two times a day (BID) | ORAL | Status: DC
  Administered 2019-01-10 – 2019-01-12 (×5): 300 mg via ORAL
  Filled 2019-01-10 (×6): qty 1

## 2019-01-10 NOTE — Progress Notes (Signed)
PROGRESS NOTE    Maurice Mckee   [id number]  DOB: Nov 10, 1925  DOA: 01/07/2019 PCP: Patient, No Pcp Per   Brief Narrative:  Maurice Mckee is a 85 y.o.malewith medical history significant ofend stage dementia, possible seizure and was started on Keppra, HTN, CAD, CHF EF 45-50%, AICD placement. Patient presents to the ED from [facility] SNF for HR in 200s and pulse ox in 80s (per ED provider's note).  Placed on 2 L O2 in ED. Per son, the patient was on antibiotics for a pneumonia for 3- 4 days.   Subjective: No complaints.     Assessment & Plan:  Principal Problem:  HCAP (healthcare-associated pneumonia), acute respiratory failure with right pleural effusion - ? Aspiration- MRSA PCR +  - was on Levaquin as outpt- now on Vanc, Ceftraixone and Azithromycin - IR consulted to drain pleural effusion but ultrasound by IR yesterday showed there was not enough fluid to tap - SLP eval > Dys 1 diet with thin liquids  - palliative care consulted in June >  family wanted aggressive measures- now family has transitioned to comfort care- awaiting a hospice home bed   Active Problems: A-fib with RVR 8/21- Hypotension - A-fib with rates in 140s associated with hypotension - given a bolus of NS and Amiodarone but no improvement in rate noted - Amiodarone infusion subsequently started and given 1 U PRBC - he had converted back to NSR - as BP has improved,   increased Coreg  - hold off on full anticoagulation- can continue Plavix - Amiodarone infusion d/c'd when family elected to move towards comfort care    Chronic systolic congestive heart failure  - no ECHO in Epic   Anemia - Hb dropping from 10 to 7.0 on 8/20- no acute bleeding- likely is dilutional- given 1 U PRBC overnight when he was hypotensive -  check anemia panel     Severe Dementia with behavioral disturbance - only knows his name, speech nonsensical at times- often does not follow commands - needs total care -  supportive care- on Remeron    H/o possible seizure in June - cont Keppra    ICD (implantable cardioverter-defibrillator) in place    Adult failure to thrive      Sacral decubitus ulcer, stage II ([place]) - wound care consulted and recommendations noted   Time spent in minutes: 35 DVT prophylaxis: Lovenox Code Status: DNR Family Communication:  Disposition Plan: hospice home Consultants:   Palliative care Procedures:   none Antimicrobials:  Anti-infectives (From admission, onward)   Start     Dose/Rate Route Frequency Ordered Stop   01/10/19 0200  vancomycin (VANCOCIN) IVPB 1000 mg/200 mL premix  Status:  Discontinued     1,000 mg 200 mL/hr over 60 Minutes Intravenous Every 24 hours 01/09/19 0932 01/09/19 1355   01/08/19 0200  vancomycin (VANCOCIN) 1,250 mg in sodium chloride 0.9 % 250 mL IVPB  Status:  Discontinued     1,250 mg 166.7 mL/hr over 90 Minutes Intravenous Every 24 hours 01/07/19 0648 01/09/19 0932   01/07/19 1400  cefTRIAXone (ROCEPHIN) 2 g in sodium chloride 0.9 % 100 mL IVPB     2 g 200 mL/hr over 30 Minutes Intravenous Every 24 hours 01/07/19 0317 01/12/19 1359   01/07/19 0400  azithromycin (ZITHROMAX) 500 mg in sodium chloride 0.9 % 250 mL IVPB     500 mg 250 mL/hr over 60 Minutes Intravenous Every 24 hours 01/07/19 0317 01/12/19 0359   01/07/19 0230  vancomycin (  VANCOCIN) 1,250 mg in sodium chloride 0.9 % 250 mL IVPB     1,250 mg 166.7 mL/hr over 90 Minutes Intravenous  Once 01/07/19 0203 01/07/19 0415   01/07/19 0200  vancomycin (VANCOCIN) IVPB 1000 mg/200 mL premix  Status:  Discontinued     1,000 mg 200 mL/hr over 60 Minutes Intravenous  Once 01/07/19 0153 01/07/19 0203   01/07/19 0200  ceFEPIme (MAXIPIME) 2 g in sodium chloride 0.9 % 100 mL IVPB     2 g 200 mL/hr over 30 Minutes Intravenous  Once 01/07/19 0153 01/07/19 0319       Objective: Vitals:   01/09/19 0549 01/09/19 0646 01/09/19 0800 01/09/19 1247  BP: 139/72  118/66 (!) 114/55   Pulse: 81  80 72  Resp: (!) 21 16  17   Temp: 97.9 F (36.6 C)  97.7 F (36.5 C) 97.8 F (36.6 C)  TempSrc: Oral  Oral Oral  SpO2: 91%  90% 93%  Weight:      Height:        Intake/Output Summary (Last 24 hours) at 01/10/2019 0911 Last data filed at 01/10/2019 0630 Gross per 24 hour  Intake 661.63 ml  Output 350 ml  Net 311.63 ml   Filed Weights   01/08/19 1334  Weight: 57.6 kg    Examination: General exam: Appears comfortable  HEENT: PERRL, oral mucosa moist, no sclera icterus or thrush Respiratory system: Clear to auscultation. Respiratory effort normal. Cardiovascular system: S1 & S2 heard,  No murmurs  Gastrointestinal system: Abdomen soft, non-tender, nondistended. Normal bowel sounds   Extremities: No cyanosis, clubbing or edema Skin: No rashes or ulcers    Data Reviewed: I have personally reviewed following labs and imaging studies  CBC: Recent Labs  Lab 01/07/19 0111 01/08/19 0745 01/09/19 0808  WBC 17.7* 14.1* 21.6*  NEUTROABS 13.1*  --   --   HGB 10.1* 7.0* 8.8*  HCT 33.2* 23.2* 27.9*  MCV 90.2 91.3 89.4  PLT 248 220 [id number]   Basic Metabolic Panel: Recent Labs  Lab 01/07/19 0111 01/08/19 0745 01/09/19 0808  NA 146* 149* 149*  K 3.8 3.6 3.7  CL 112* 118* 117*  CO2 25 24 23   GLUCOSE 141* 108* 113*  BUN 37* 42* 53*  CREATININE 0.96 0.69 1.04  CALCIUM 9.5 9.1 9.3  MG 1.9  --   --    GFR: Estimated Creatinine Clearance: 37.7 mL/min (by C-G formula based on SCr of 1.04 mg/dL). Liver Function Tests: Recent Labs  Lab 01/07/19 0111  AST 18  ALT 25  ALKPHOS 80  BILITOT 0.4  PROT 5.5*  ALBUMIN 2.2*   No results for input(s): LIPASE, AMYLASE in the last 168 hours. No results for input(s): AMMONIA in the last 168 hours. Coagulation Profile: No results for input(s): INR, PROTIME in the last 168 hours. Cardiac Enzymes: No results for input(s): CKTOTAL, CKMB, CKMBINDEX, TROPONINI in the last 168 hours. BNP (last 3 results) No results for  input(s): PROBNP in the last 8760 hours. HbA1C: No results for input(s): HGBA1C in the last 72 hours. CBG: No results for input(s): GLUCAP in the last 168 hours. Lipid Profile: No results for input(s): CHOL, HDL, LDLCALC, TRIG, CHOLHDL, LDLDIRECT in the last 72 hours. Thyroid Function Tests: No results for input(s): TSH, T4TOTAL, FREET4, T3FREE, THYROIDAB in the last 72 hours. Anemia Panel: Recent Labs    01/09/19 0808  VITAMINB12 745  FOLATE 15.7  FERRITIN 136  TIBC 186*  IRON 89  RETICCTPCT 2.7   Urine  analysis:    Component Value Date/Time   COLORURINE YELLOW 01/07/2019 0245   APPEARANCEUR CLEAR 01/07/2019 0245   LABSPEC 1.023 01/07/2019 0245   PHURINE 5.0 01/07/2019 0245   GLUCOSEU NEGATIVE 01/07/2019 0245   HGBUR NEGATIVE 01/07/2019 0245   BILIRUBINUR NEGATIVE 01/07/2019 0245   KETONESUR NEGATIVE 01/07/2019 0245   PROTEINUR NEGATIVE 01/07/2019 0245   NITRITE NEGATIVE 01/07/2019 0245   LEUKOCYTESUR NEGATIVE 01/07/2019 0245   Sepsis Labs: @LABRCNTIP (procalcitonin:4,lacticidven:4) ) Recent Results (from the past 240 hour(s))  Culture, blood (Routine X 2) w Reflex to ID Panel     Status: Abnormal   Collection Time: 01/07/19  2:30 AM   Specimen: BLOOD LEFT FOREARM  Result Value Ref Range Status   Specimen Description BLOOD LEFT FOREARM  Final   Special Requests   Final    BOTTLES DRAWN AEROBIC AND ANAEROBIC Blood Culture results may not be optimal due to an inadequate volume of blood received in culture bottles   Culture  Setup Time   Final    ANAEROBIC BOTTLE ONLY GRAM POSITIVE RODS CRITICAL RESULT CALLED TO, READ BACK BY AND VERIFIED WITH: J MILLEN PHARMD 01/07/19 1953 JDW AEROBIC BOTTLE ONLY GRAM POSITIVE COCCI Organism ID to follow CRITICAL RESULT CALLED TO, READ BACK BY AND VERIFIED WITH: PHRMD @0536  01/08/19 BY S GEZAHEGN Performed at [facility], [place] [address] [place], [place] [address]    Culture (A)  Final    STAPHYLOCOCCUS SPECIES (COAGULASE  NEGATIVE) THE SIGNIFICANCE OF ISOLATING THIS ORGANISM FROM A SINGLE SET OF BLOOD CULTURES WHEN MULTIPLE SETS ARE DRAWN IS UNCERTAIN. PLEASE NOTIFY THE MICROBIOLOGY DEPARTMENT WITHIN ONE WEEK IF SPECIATION AND SENSITIVITIES ARE REQUIRED. CLOSTRIDIUM PERFRINGENS    Report Status 01/10/2019 FINAL  Final  Culture, blood (Routine X 2) w Reflex to ID Panel     Status: None (Preliminary result)   Collection Time: 01/07/19  2:30 AM   Specimen: BLOOD LEFT FOREARM  Result Value Ref Range Status   Specimen Description BLOOD LEFT FOREARM  Final   Special Requests   Final    BOTTLES DRAWN AEROBIC ONLY Blood Culture results may not be optimal due to an inadequate volume of blood received in culture bottles   Culture   Final    NO GROWTH 3 DAYS Performed at [facility], [place] [address], [place],  [address]    Report Status PENDING  Incomplete  Blood Culture ID Panel (Reflexed)     Status: Abnormal   Collection Time: 01/07/19  2:30 AM  Result Value Ref Range Status   Enterococcus species NOT DETECTED NOT DETECTED Final   Listeria monocytogenes NOT DETECTED NOT DETECTED Final   Staphylococcus species DETECTED (A) NOT DETECTED Final    Comment: Methicillin (oxacillin) resistant coagulase negative staphylococcus. Possible blood culture contaminant (unless isolated from more than one blood culture draw or clinical case suggests pathogenicity). No antibiotic treatment is indicated for blood  culture contaminants. CRITICAL RESULT CALLED TO, READ BACK BY AND VERIFIED WITH: PHRMD @0536  01/08/19 BY S GEZAHEGN    Staphylococcus aureus (BCID) NOT DETECTED NOT DETECTED Final   Methicillin resistance DETECTED (A) NOT DETECTED Final    Comment: CRITICAL RESULT CALLED TO, READ BACK BY AND VERIFIED WITH: PHRMD @0536  01/08/19 BY S GEZAHEGN    Streptococcus species NOT DETECTED NOT DETECTED Final   Streptococcus agalactiae NOT DETECTED NOT DETECTED Final   Streptococcus pneumoniae NOT DETECTED NOT  DETECTED Final   Streptococcus pyogenes NOT DETECTED NOT DETECTED Final   Acinetobacter  baumannii NOT DETECTED NOT DETECTED Final   Enterobacteriaceae species NOT DETECTED NOT DETECTED Final   Enterobacter cloacae complex NOT DETECTED NOT DETECTED Final   Escherichia coli NOT DETECTED NOT DETECTED Final   Klebsiella oxytoca NOT DETECTED NOT DETECTED Final   Klebsiella pneumoniae NOT DETECTED NOT DETECTED Final   Proteus species NOT DETECTED NOT DETECTED Final   Serratia marcescens NOT DETECTED NOT DETECTED Final   Haemophilus influenzae NOT DETECTED NOT DETECTED Final   Neisseria meningitidis NOT DETECTED NOT DETECTED Final   Pseudomonas aeruginosa NOT DETECTED NOT DETECTED Final   Candida albicans NOT DETECTED NOT DETECTED Final   Candida glabrata NOT DETECTED NOT DETECTED Final   Candida krusei NOT DETECTED NOT DETECTED Final   Candida parapsilosis NOT DETECTED NOT DETECTED Final   Candida tropicalis NOT DETECTED NOT DETECTED Final    Comment: Performed at [facility], [place] [address], [place], [place] [address]  SARS Coronavirus 2 [facility] 2 order, Performed in [facility] hospital lab) Nasopharyngeal     Status: None   Collection Time: 01/07/19  2:47 AM   Specimen: Nasopharyngeal  Result Value Ref Range Status   SARS Coronavirus 2 NEGATIVE NEGATIVE Final    Comment: (NOTE) If result is NEGATIVE SARS-CoV-2 target nucleic acids are NOT DETECTED. The SARS-CoV-2 RNA is generally detectable in upper and lower  respiratory specimens during the acute phase of infection. The lowest  concentration of SARS-CoV-2 viral copies this assay can detect is 250  copies / mL. A negative result does not preclude SARS-CoV-2 infection  and should not be used as the sole basis for treatment or other  patient management decisions.  A negative result may occur with  improper specimen collection / handling, submission of specimen other  than nasopharyngeal swab, presence of viral mutation(s) within  the  areas targeted by this assay, and inadequate number of viral copies  (<250 copies / mL). A negative result must be combined with clinical  observations, patient history, and epidemiological information. If result is POSITIVE SARS-CoV-2 target nucleic acids are DETECTED. The SARS-CoV-2 RNA is generally detectable in upper and lower  respiratory specimens dur ing the acute phase of infection.  Positive  results are indicative of active infection with SARS-CoV-2.  Clinical  correlation with patient history and other diagnostic information is  necessary to determine patient infection status.  Positive results do  not rule out bacterial infection or co-infection with other viruses. If result is PRESUMPTIVE POSTIVE SARS-CoV-2 nucleic acids MAY BE PRESENT.   A presumptive positive result was obtained on the submitted specimen  and confirmed on repeat testing.  While 2019 novel coronavirus  (SARS-CoV-2) nucleic acids may be present in the submitted sample  additional confirmatory testing may be necessary for epidemiological  and / or clinical management purposes  to differentiate between  SARS-CoV-2 and other Sarbecovirus currently known to infect humans.  If clinically indicated additional testing with an alternate test  methodology [phone]) is advised. The SARS-CoV-2 RNA is generally  detectable in upper and lower respiratory sp ecimens during the acute  phase of infection. The expected result is Negative. Fact Sheet for Patients:  StrictlyIdeas.no Fact Sheet for Healthcare Providers: BankingDealers.co.za This test is not yet approved or cleared by the [place] FDA and has been authorized for detection and/or diagnosis of SARS-CoV-2 by FDA under an Emergency Use Authorization (EUA).  This EUA will remain in effect (meaning this test can be used) for the duration of the COVID-19 declaration under Section 564(b)(1)  of the Act, 21  U.S.C. section 360bbb-3(b)(1), unless the authorization is terminated or revoked sooner. Performed at [facility], [place] [address] [place], [place] [address]   MRSA PCR Screening     Status: Abnormal   Collection Time: 01/07/19  3:35 AM   Specimen: Nasal Mucosa; Nasopharyngeal  Result Value Ref Range Status   MRSA by PCR POSITIVE (A) NEGATIVE Final    Comment:        The GeneXpert MRSA Assay (FDA approved for NASAL specimens only), is one component of a comprehensive MRSA colonization surveillance program. It is not intended to diagnose MRSA infection nor to guide or monitor treatment for MRSA infections. RESULT CALLED TO, READ BACK BY AND VERIFIED WITH: [facility] RN [id number] 01/07/2019 MITCHELL,L Performed at [facility], [place] [address], [place], [place] [address]   MRSA PCR Screening     Status: Abnormal   Collection Time: 01/09/19  8:45 AM   Specimen: Nasal Mucosa; Nasopharyngeal  Result Value Ref Range Status   MRSA by PCR POSITIVE (A) NEGATIVE Final    Comment:        The GeneXpert MRSA Assay (FDA approved for NASAL specimens only), is one component of a comprehensive MRSA colonization surveillance program. It is not intended to diagnose MRSA infection nor to guide or monitor treatment for MRSA infections. RESULT CALLED TO, READ BACK BY AND VERIFIED WITH: RN Garlon Hatchet [id number] Performed at [facility], [place] [address] [place], [place] [address]          Radiology Studies: Ir US Chest  Result Date: 01/08/2019 CLINICAL DATA:  85 year old with altered mental status and request for thoracentesis. EXAM: CHEST ULTRASOUND COMPARISON:  Chest radiograph 01/07/2019 FINDINGS: Small amount of right pleural fluid is present. Could not identify a safe percutaneous window for thoracentesis. IMPRESSION: Small right pleural effusion.  Thoracentesis not performed. Electronically Signed   By: Markus Daft M.D.   On: 01/08/2019 14:34       Scheduled Meds: . carvedilol  12.5 mg Oral BID WC  . Chlorhexidine Gluconate Cloth  6 each Topical Q0600  . collagenase   Topical Daily  . docusate  100 mg Oral BID  . levETIRAcetam  250 mg Oral BID  . Melatonin  3 mg Oral QHS  . mirtazapine  7.5 mg Oral QHS  . mupirocin ointment   Nasal BID   Continuous Infusions: . azithromycin 500 mg (01/10/19 0556)  . cefTRIAXone (ROCEPHIN)  IV Stopped (01/09/19 1337)  . sodium chloride       LOS: 3 days      Debbe Odea, MD [facility] Pager: [email or web address] Password [email or web address] 01/10/2019, 9:11 AM

## 2019-01-11 MED ORDER — MORPHINE SULFATE 10 MG/5ML PO SOLN
2.5000 mg | Freq: Two times a day (BID) | ORAL | Status: DC
  Administered 2019-01-11 – 2019-01-12 (×2): 2.5 mg via ORAL
  Filled 2019-01-11 (×2): qty 2

## 2019-01-11 MED ORDER — MORPHINE SULFATE 10 MG/5ML PO SOLN: 2.5000 mg | Freq: Three times a day (TID) | ORAL | Status: DC

## 2019-01-11 NOTE — Progress Notes (Signed)
PROGRESS NOTE    Maurice Mckee   [id number]  DOB: [birth year]  DOA: 01/07/2019 PCP: Patient, No Pcp Per   Brief Narrative:  Maurice Mckee is a 85 y.o.malewith medical history significant ofend stage dementia, possible seizure and was started on Keppra, HTN, CAD, CHF EF 45-50%, AICD placement. Patient presents to the ED from [facility] for HR in 200s and pulse ox in 80s (per ED provider's note).  Placed on 2 L O2 in ED. Per son, the patient was on antibiotics for a pneumonia for 3- 4 days.   Subjective: Asleep.     Assessment & Plan:  Principal Problem:  HCAP (healthcare-associated pneumonia), acute respiratory failure with right pleural effusion - ? Aspiration- MRSA PCR +  - was on Levaquin as outpt- now on Vanc, Ceftraixone and Azithromycin - IR consulted to drain pleural effusion but ultrasound by IR yesterday showed there was not enough fluid to tap - SLP eval > Dys 1 diet with thin liquids  - palliative care consulted in June >  family wanted aggressive measures- now family has transitioned to comfort care- awaiting a hospice home bed   Active Problems: A-fib with RVR 8/21- Hypotension - A-fib with rates in 140s associated with hypotension - given a bolus of NS and Amiodarone but no improvement in rate noted - Amiodarone infusion subsequently started and given 1 U PRBC - he had converted back to NSR - as BP has improved,   increased Coreg  - hold off on full anticoagulation- can continue Plavix - Amiodarone infusion d/c'd when family elected to move towards comfort care    Chronic systolic congestive heart failure  - no ECHO in Epic   Anemia - Hb dropping from 10 to 7.0 on 8/20- no acute bleeding- likely is dilutional- given 1 U PRBC overnight when he was hypotensive -  check anemia panel     Severe Dementia with behavioral disturbance - only knows his name, speech nonsensical at times- often does not follow commands - needs total care -  supportive care- on Remeron    H/o possible seizure in June - cont Keppra    ICD (implantable cardioverter-defibrillator) in place    Adult failure to thrive      Sacral decubitus ulcer, stage II ([place]) - wound care consulted and recommendations noted   Time spent in minutes: 35 DVT prophylaxis: Lovenox Code Status: DNR Family Communication: son, Maurice Mckee Disposition Plan: hospice home Consultants:   Palliative care Procedures:   none Antimicrobials:  Anti-infectives (From admission, onward)   Start     Dose/Rate Route Frequency Ordered Stop   01/10/19 1000  azithromycin (ZITHROMAX) tablet 500 mg     500 mg Oral Daily 01/10/19 0914     01/10/19 1000  cefdinir (OMNICEF) capsule 300 mg     300 mg Oral Every 12 hours 01/10/19 0914     01/10/19 0200  vancomycin (VANCOCIN) IVPB 1000 mg/200 mL premix  Status:  Discontinued     1,000 mg 200 mL/hr over 60 Minutes Intravenous Every 24 hours 01/09/19 0932 01/09/19 1355   01/08/19 0200  vancomycin (VANCOCIN) 1,250 mg in sodium chloride 0.9 % 250 mL IVPB  Status:  Discontinued     1,250 mg 166.7 mL/hr over 90 Minutes Intravenous Every 24 hours 01/07/19 0648 01/09/19 0932   01/07/19 1400  cefTRIAXone (ROCEPHIN) 2 g in sodium chloride 0.9 % 100 mL IVPB  Status:  Discontinued     2 g 200 mL/hr over 30  Minutes Intravenous Every 24 hours 01/07/19 0317 01/10/19 0914   01/07/19 0400  azithromycin (ZITHROMAX) 500 mg in sodium chloride 0.9 % 250 mL IVPB  Status:  Discontinued     500 mg 250 mL/hr over 60 Minutes Intravenous Every 24 hours 01/07/19 0317 01/10/19 0914   01/07/19 0230  vancomycin (VANCOCIN) 1,250 mg in sodium chloride 0.9 % 250 mL IVPB     1,250 mg 166.7 mL/hr over 90 Minutes Intravenous  Once 01/07/19 0203 01/07/19 0415   01/07/19 0200  vancomycin (VANCOCIN) IVPB 1000 mg/200 mL premix  Status:  Discontinued     1,000 mg 200 mL/hr over 60 Minutes Intravenous  Once 01/07/19 0153 01/07/19 0203   01/07/19 0200  ceFEPIme  (MAXIPIME) 2 g in sodium chloride 0.9 % 100 mL IVPB     2 g 200 mL/hr over 30 Minutes Intravenous  Once 01/07/19 0153 01/07/19 0319       Objective: Vitals:   01/09/19 0800 01/09/19 1247 01/10/19 1500 01/11/19 0527  BP: 118/66 (!) 114/55 128/83 (!) 157/92  Pulse: 80 72 87 84  Resp:  17 16 15   Temp: 97.7 F (36.5 C) 97.8 F (36.6 C) 97.7 F (36.5 C) 97.6 F (36.4 C)  TempSrc: Oral Oral Oral   SpO2: 90% 93% 97% 97%  Weight:      Height:        Intake/Output Summary (Last 24 hours) at 01/11/2019 0928 Last data filed at 01/10/2019 2345 Gross per 24 hour  Intake 240 ml  Output 875 ml  Net -635 ml   Filed Weights   01/08/19 1334  Weight: 57.6 kg    Examination: General exam: Appears comfortable  HEENT: PERRLA, oral mucosa moist, no sclera icterus or thrush Respiratory system: Clear to auscultation. Respiratory effort normal. Cardiovascular system: S1 & S2 heard,  No murmurs  Gastrointestinal system: Abdomen soft, non-tender, nondistended. Normal bowel sounds   Extremities: No cyanosis, clubbing or edema Skin: No rashes or ulcers  Data Reviewed: I have personally reviewed following labs and imaging studies  CBC: Recent Labs  Lab 01/07/19 0111 01/08/19 0745 01/09/19 0808  WBC 17.7* 14.1* 21.6*  NEUTROABS 13.1*  --   --   HGB 10.1* 7.0* 8.8*  HCT 33.2* 23.2* 27.9*  MCV 90.2 91.3 89.4  PLT 248 220 [id number]   Basic Metabolic Panel: Recent Labs  Lab 01/07/19 0111 01/08/19 0745 01/09/19 0808  NA 146* 149* 149*  K 3.8 3.6 3.7  CL 112* 118* 117*  CO2 25 24 23   GLUCOSE 141* 108* 113*  BUN 37* 42* 53*  CREATININE 0.96 0.69 1.04  CALCIUM 9.5 9.1 9.3  MG 1.9  --   --    GFR: Estimated Creatinine Clearance: 37.7 mL/min (by C-G formula based on SCr of 1.04 mg/dL). Liver Function Tests: Recent Labs  Lab 01/07/19 0111  AST 18  ALT 25  ALKPHOS 80  BILITOT 0.4  PROT 5.5*  ALBUMIN 2.2*   No results for input(s): LIPASE, AMYLASE in the last 168 hours. No  results for input(s): AMMONIA in the last 168 hours. Coagulation Profile: No results for input(s): INR, PROTIME in the last 168 hours. Cardiac Enzymes: No results for input(s): CKTOTAL, CKMB, CKMBINDEX, TROPONINI in the last 168 hours. BNP (last 3 results) No results for input(s): PROBNP in the last 8760 hours. HbA1C: No results for input(s): HGBA1C in the last 72 hours. CBG: No results for input(s): GLUCAP in the last 168 hours. Lipid Profile: No results for input(s): CHOL, HDL,  LDLCALC, TRIG, CHOLHDL, LDLDIRECT in the last 72 hours. Thyroid Function Tests: No results for input(s): TSH, T4TOTAL, FREET4, T3FREE, THYROIDAB in the last 72 hours. Anemia Panel: Recent Labs    01/09/19 0808  VITAMINB12 745  FOLATE 15.7  FERRITIN 136  TIBC 186*  IRON 89  RETICCTPCT 2.7   Urine analysis:    Component Value Date/Time   COLORURINE YELLOW 01/07/2019 0245   APPEARANCEUR CLEAR 01/07/2019 0245   LABSPEC 1.023 01/07/2019 0245   PHURINE 5.0 01/07/2019 0245   GLUCOSEU NEGATIVE 01/07/2019 0245   HGBUR NEGATIVE 01/07/2019 0245   BILIRUBINUR NEGATIVE 01/07/2019 0245   KETONESUR NEGATIVE 01/07/2019 0245   PROTEINUR NEGATIVE 01/07/2019 0245   NITRITE NEGATIVE 01/07/2019 0245   LEUKOCYTESUR NEGATIVE 01/07/2019 0245   Sepsis Labs: @LABRCNTIP (procalcitonin:4,lacticidven:4) ) Recent Results (from the past 240 hour(s))  Culture, blood (Routine X 2) w Reflex to ID Panel     Status: Abnormal   Collection Time: 01/07/19  2:30 AM   Specimen: BLOOD LEFT FOREARM  Result Value Ref Range Status   Specimen Description BLOOD LEFT FOREARM  Final   Special Requests   Final    BOTTLES DRAWN AEROBIC AND ANAEROBIC Blood Culture results may not be optimal due to an inadequate volume of blood received in culture bottles   Culture  Setup Time   Final    ANAEROBIC BOTTLE ONLY GRAM POSITIVE RODS CRITICAL RESULT CALLED TO, READ BACK BY AND VERIFIED WITH: J MILLEN PHARMD 01/07/19 1953 JDW AEROBIC BOTTLE ONLY  GRAM POSITIVE COCCI Organism ID to follow CRITICAL RESULT CALLED TO, READ BACK BY AND VERIFIED WITH: PHRMD @0536  01/08/19 BY S GEZAHEGN Performed at [facility], [place] [address] [place], [place] [address]    Culture (A)  Final    STAPHYLOCOCCUS SPECIES (COAGULASE NEGATIVE) THE SIGNIFICANCE OF ISOLATING THIS ORGANISM FROM A SINGLE SET OF BLOOD CULTURES WHEN MULTIPLE SETS ARE DRAWN IS UNCERTAIN. PLEASE NOTIFY THE MICROBIOLOGY DEPARTMENT WITHIN ONE WEEK IF SPECIATION AND SENSITIVITIES ARE REQUIRED. CLOSTRIDIUM PERFRINGENS    Report Status 01/10/2019 FINAL  Final  Culture, blood (Routine X 2) w Reflex to ID Panel     Status: None (Preliminary result)   Collection Time: 01/07/19  2:30 AM   Specimen: BLOOD LEFT FOREARM  Result Value Ref Range Status   Specimen Description BLOOD LEFT FOREARM  Final   Special Requests   Final    BOTTLES DRAWN AEROBIC ONLY Blood Culture results may not be optimal due to an inadequate volume of blood received in culture bottles   Culture   Final    NO GROWTH 4 DAYS Performed at [facility], [place] [address] [place], [place] [address]    Report Status PENDING  Incomplete  Blood Culture ID Panel (Reflexed)     Status: Abnormal   Collection Time: 01/07/19  2:30 AM  Result Value Ref Range Status   Enterococcus species NOT DETECTED NOT DETECTED Final   Listeria monocytogenes NOT DETECTED NOT DETECTED Final   Staphylococcus species DETECTED (A) NOT DETECTED Final    Comment: Methicillin (oxacillin) resistant coagulase negative staphylococcus. Possible blood culture contaminant (unless isolated from more than one blood culture draw or clinical case suggests pathogenicity). No antibiotic treatment is indicated for blood  culture contaminants. CRITICAL RESULT CALLED TO, READ BACK BY AND VERIFIED WITH: PHRMD @0536  01/08/19 BY S GEZAHEGN    Staphylococcus aureus (BCID) NOT DETECTED NOT DETECTED Final   Methicillin resistance DETECTED (A) NOT DETECTED Final     Comment:  CRITICAL RESULT CALLED TO, READ BACK BY AND VERIFIED WITH: PHRMD @0536  01/08/19 BY S GEZAHEGN    Streptococcus species NOT DETECTED NOT DETECTED Final   Streptococcus agalactiae NOT DETECTED NOT DETECTED Final   Streptococcus pneumoniae NOT DETECTED NOT DETECTED Final   Streptococcus pyogenes NOT DETECTED NOT DETECTED Final   Acinetobacter baumannii NOT DETECTED NOT DETECTED Final   Enterobacteriaceae species NOT DETECTED NOT DETECTED Final   Enterobacter cloacae complex NOT DETECTED NOT DETECTED Final   Escherichia coli NOT DETECTED NOT DETECTED Final   Klebsiella oxytoca NOT DETECTED NOT DETECTED Final   Klebsiella pneumoniae NOT DETECTED NOT DETECTED Final   Proteus species NOT DETECTED NOT DETECTED Final   Serratia marcescens NOT DETECTED NOT DETECTED Final   Haemophilus influenzae NOT DETECTED NOT DETECTED Final   Neisseria meningitidis NOT DETECTED NOT DETECTED Final   Pseudomonas aeruginosa NOT DETECTED NOT DETECTED Final   Candida albicans NOT DETECTED NOT DETECTED Final   Candida glabrata NOT DETECTED NOT DETECTED Final   Candida krusei NOT DETECTED NOT DETECTED Final   Candida parapsilosis NOT DETECTED NOT DETECTED Final   Candida tropicalis NOT DETECTED NOT DETECTED Final    Comment: Performed at [facility], [place] [address] [place], [place] [address]  SARS Coronavirus 2 [facility] order, Performed in [facility] hospital lab) Nasopharyngeal     Status: None   Collection Time: 01/07/19  2:47 AM   Specimen: Nasopharyngeal  Result Value Ref Range Status   SARS Coronavirus 2 NEGATIVE NEGATIVE Final    Comment: (NOTE) If result is NEGATIVE SARS-CoV-2 target nucleic acids are NOT DETECTED. The SARS-CoV-2 RNA is generally detectable in upper and lower  respiratory specimens during the acute phase of infection. The lowest  concentration of SARS-CoV-2 viral copies this assay can detect is 250  copies / mL. A negative result does not preclude SARS-CoV-2 infection   and should not be used as the sole basis for treatment or other  patient management decisions.  A negative result may occur with  improper specimen collection / handling, submission of specimen other  than nasopharyngeal swab, presence of viral mutation(s) within the  areas targeted by this assay, and inadequate number of viral copies  (<250 copies / mL). A negative result must be combined with clinical  observations, patient history, and epidemiological information. If result is POSITIVE SARS-CoV-2 target nucleic acids are DETECTED. The SARS-CoV-2 RNA is generally detectable in upper and lower  respiratory specimens dur ing the acute phase of infection.  Positive  results are indicative of active infection with SARS-CoV-2.  Clinical  correlation with patient history and other diagnostic information is  necessary to determine patient infection status.  Positive results do  not rule out bacterial infection or co-infection with other viruses. If result is PRESUMPTIVE POSTIVE SARS-CoV-2 nucleic acids MAY BE PRESENT.   A presumptive positive result was obtained on the submitted specimen  and confirmed on repeat testing.  While 2019 novel coronavirus  (SARS-CoV-2) nucleic acids may be present in the submitted sample  additional confirmatory testing may be necessary for epidemiological  and / or clinical management purposes  to differentiate between  SARS-CoV-2 and other Sarbecovirus currently known to infect humans.  If clinically indicated additional testing with an alternate test  methodology [phone]) is advised. The SARS-CoV-2 RNA is generally  detectable in upper and lower respiratory sp ecimens during the acute  phase of infection. The expected result is Negative. Fact Sheet for Patients:  StrictlyIdeas.no Fact Sheet for Healthcare  Providers: BankingDealers.co.za This test is not yet approved or cleared by the [place] and  has been authorized for detection and/or diagnosis of SARS-CoV-2 by FDA under an Emergency Use Authorization (EUA).  This EUA will remain in effect (meaning this test can be used) for the duration of the COVID-19 declaration under Section 564(b)(1) of the Act, 21 U.S.C. section 360bbb-3(b)(1), unless the authorization is terminated or revoked sooner. Performed at [facility], [place] [address], [place], [place] [address]   MRSA PCR Screening     Status: Abnormal   Collection Time: 01/07/19  3:35 AM   Specimen: Nasal Mucosa; Nasopharyngeal  Result Value Ref Range Status   MRSA by PCR POSITIVE (A) NEGATIVE Final    Comment:        The GeneXpert MRSA Assay (FDA approved for NASAL specimens only), is one component of a comprehensive MRSA colonization surveillance program. It is not intended to diagnose MRSA infection nor to guide or monitor treatment for MRSA infections. RESULT CALLED TO, READ BACK BY AND VERIFIED WITH: [facility] RN [id number] 01/07/2019 MITCHELL,L Performed at [facility], [place] [address], [place], [place] [address]   MRSA PCR Screening     Status: Abnormal   Collection Time: 01/09/19  8:45 AM   Specimen: Nasal Mucosa; Nasopharyngeal  Result Value Ref Range Status   MRSA by PCR POSITIVE (A) NEGATIVE Final    Comment:        The GeneXpert MRSA Assay (FDA approved for NASAL specimens only), is one component of a comprehensive MRSA colonization surveillance program. It is not intended to diagnose MRSA infection nor to guide or monitor treatment for MRSA infections. RESULT CALLED TO, READ BACK BY AND VERIFIED WITH: RN Garlon Hatchet [id number] MLM Performed at [facility], [place] [address] [place], [place] [address]          Radiology Studies: No results found.    Scheduled Meds: . azithromycin  500 mg Oral Daily  . carvedilol  12.5 mg Oral BID WC  . cefdinir  300 mg Oral Q12H  . Chlorhexidine Gluconate Cloth  6 each Topical Q0600   . collagenase   Topical Daily  . docusate  100 mg Oral BID  . levETIRAcetam  250 mg Oral BID  . Melatonin  3 mg Oral QHS  . mirtazapine  7.5 mg Oral QHS  . mupirocin ointment   Nasal BID   Continuous Infusions: . sodium chloride       LOS: 4 days      Debbe Odea, MD [facility] Pager: [email or web address] Password [facility] 01/11/2019, 9:28 AM

## 2019-01-11 NOTE — TOC Progression Note (Signed)
Transition of Care [facility]) - Progression Note    Patient Details  Name: Maurice Mckee MRN: [id number] Date of Birth: May 11, 1926  Transition of [facility]) CM/SW [place], LCSW Phone Number: 01/11/2019, 3:18 PM  Clinical Narrative:   Patient at this time was denied by [facility]. [facility] felt that patient did not meet the requirement for there Hospice House. Representative Beverlee Nims stated they will reassess patient in the morning to see if he will qualify for hospice placement     Expected Discharge Plan: [place] Barriers to Discharge: Hospice Bed not available  Expected Discharge Plan and Services Expected Discharge Plan: [place] In-house Referral: Clinical Social Work Discharge Planning Services: NA Post Acute Care Choice: Hospice Living arrangements for the past 2 months: [place]                 DME Arranged: N/A DME Agency: NA       HH Arranged: NA HH Agency: NA         Social Determinants of Health (SDOH) Interventions    Readmission Risk Interventions No flowsheet data found.

## 2019-01-11 NOTE — Progress Notes (Signed)
Daily Progress Note   Patient Name: Maurice Mckee       Date: 01/11/2019 DOB: 10-14-1925  Age: 85 y.o. MRN#: [id number] Attending Physician: Debbe Odea, MD Primary Care Physician: Patient, No Pcp Per Admit Date: 01/07/2019  Reason for Consultation/Follow-up: Establishing goals of care, Pain control, Psychosocial/spiritual support and Terminal Care  Subjective: Son at bedside.  Patient will wake, grimace, lift his hands in the air and then fall back to sleep.  He has refused anything to eat his morning even with his son's encouragement.  Discussed care at hospice house.  Son is concerned patient will not be fed.  I reassured him that Hospice will focus on his comfort.  If wants to eat or drink they will feed him.  Son can bring things from home if he wants.  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  He did heavy work his whole life.  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   Assessment: Not responding to me.  Has not eaten.  Lethargic.  Intermittently grimacing as though he is in pain.   Patient Profile/HPI:  85 y.o. male  admitted on 01/07/2019 with  with past medical history significant ofend stage dementia, recent hip fx, HTN, CAD, CVA, CHF EF 45-50%, AICD.  Patient presents to the ED from [facility], with reported tachycardia.    Currently treated being treated for pneumonia, elevated WBCs.  Patient declined and became persistently lethargic not eating.     Length of Stay: 4  Current Medications: Scheduled Meds:  . azithromycin  500 mg Oral Daily  . carvedilol  12.5 mg Oral BID WC  . cefdinir  300 mg Oral Q12H  . Chlorhexidine Gluconate Cloth  6 each Topical Q0600  . collagenase   Topical Daily  . docusate  100 mg  Oral BID  . levETIRAcetam  250 mg Oral BID  . Melatonin  3 mg Oral QHS  . mirtazapine  7.5 mg Oral QHS  . mupirocin ointment   Nasal BID    Continuous Infusions: . sodium chloride      PRN Meds: acetaminophen **OR** acetaminophen, antiseptic oral rinse, glycopyrrolate **OR** glycopyrrolate **OR** glycopyrrolate, haloperidol **OR** haloperidol **OR** haloperidol lactate, morphine injection, ondansetron **OR** ondansetron (ZOFRAN) IV, polyvinyl alcohol  Physical Exam  [name] in fetal position.  Does not respond to me CV rrr  Sound distant resp NAD Abdomen thin, NT, ND   Vital Signs: BP (!) 157/92 (BP Location: Left Arm)   Pulse 84   Temp 97.6 F (36.4 C)   Resp 15   Ht 5\' 11"  (1.803 m)   Wt 57.6 kg Comment: from 11/17/18 encounter  SpO2 97%   BMI 17.71 kg/m  SpO2: SpO2: 97 % O2 Device: O2 Device: Room Air O2 Flow Rate: O2 Flow Rate (L/min): 2 L/min  Intake/output summary:   Intake/Output Summary (Last 24 hours) at 01/11/2019 1040 Last data filed at 01/10/2019 2345 Gross per 24 hour  Intake 240 ml  Output 875 ml  Net -635 ml   LBM: Last BM Date: 01/11/19 Baseline Weight: Weight: 57.6 kg(from 11/17/18 encounter) Most recent weight: Weight: 57.6 kg(from 11/17/18 encounter)       Palliative Assessment/Data: 20%      Patient Active Problem List   Diagnosis Date Noted  . Paroxysmal atrial fibrillation (HCC)   . Anemia   . Pleural effusion   . Dementia associated with other underlying disease without behavioral disturbance ([place])   . Palliative care encounter   . HCAP (healthcare-associated pneumonia) 01/07/2019  . Parapneumonic effusion 01/07/2019  . Sacral decubitus ulcer, stage II ([place]) 01/07/2019  . Adult failure to thrive   . Hypotension   . Near syncope   . Agitation   . Altered mental status, unspecified 11/12/2018  . Hypoxia   . Protein-calorie malnutrition, severe 10/16/2018  . Pneumothorax   . Pressure injury of skin 10/11/2018  .  Palliative care by specialist   . Hip fracture ([place]) 10/10/2018  . Multiple falls 03/18/2018  . Fracture of inferior pubic ramus ([place]) 03/18/2018  . Delirium 03/18/2018  . BPH (benign prostatic hyperplasia) 03/18/2018  . Urinary urgency 06/21/2017  . Spinal stenosis of lumbar region 05/26/2017  . Fracture of two ribs of right side, closed, initial encounter 01/25/2017  . Anemia, normocytic normochromic 11/09/2016  . Non-seasonal allergic rhinitis 02/15/2016  . Risk for falls 11/16/2015  . Vision problem 11/16/2015  . Vitamin D deficiency 07/29/2015  . Urinary incontinence 07/08/2015  . Pulmonary emphysema ([place]) 11/26/2014  . 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 Primary osteoarthritis of right hip 08/30/2014  . Hyperlipidemia 05/10/2014  . Right hip pain 12/24/2013  . Enlarged prostate without lower urinary tract symptoms (l[place]) 12/24/2013  . Hyperglycemia 12/24/2013  . Numbness 12/24/2013  . Chronic systolic congestive heart failure ([place]) 06/11/2013  . Congestive heart failure ([place]) 06/11/2013  . Dementia with behavioral disturbance ([place]) 06/11/2013  . Essential hypertension 06/11/2013  . Primary cardiomyopathy ([place]) 06/11/2013  . Abnormal glucose 03/02/2013  . Diarrhea 03/02/2013  . Insomnia 03/02/2013  . Abnormality of gait 10/25/2012  . DNR (do not resuscitate) discussion 10/25/2012  . Generalized osteoarthritis of multiple sites 10/25/2012  . Skin sensation disturbance 10/25/2012  . Backache 08/20/2011  . Other allergy, other than to medicinal agents 01/19/2010    Palliative Care Plan    Recommendations/Plan:  Comfort care.  DC to hospice house when bed available.  [place] in the family's 1st choice due to location.   Goals of Care and Additional Recommendations:  Limitations on Scope of Treatment: Full Comfort Care  Code Status:  DNR  Prognosis:   less than 2 weeks.  Discharge Planning:  Hospice facility  Care plan  was discussed with son, RN  Thank you for allowing the Palliative Medicine  Team to assist in the care of this patient.  Total time spent:  25 min.     Greater than 50%  of this time was spent counseling and coordinating care related to the above assessment and plan.  Florentina Jenny, PA-C Palliative Medicine  Please contact Palliative MedicineTeam phone at [phone] for questions and concerns between 7 am - 7 pm.   Please see AMION for individual provider pager numbers.

## 2019-01-11 NOTE — Progress Notes (Signed)
.  I received a call from Francee Piccolo (son).  Before calling him back I reviewed Epic and checked on Mr. Demary.  I could not wake him.  His breathing was irregular, heart sounds were faint.  I spoke to his bedside RN who reported he was awake 30 min ago and ate a good lunch.  I returned [name]'s call.  He asked several questions - "Have I made the right choice?" "Am I letting him go too soon?"  After which he told me he couldn't talk right now because he was going to his anniversary dinner.   We agreed to talk in the morning on 8/23.  Florentina Jenny, PA-C Palliative Medicine Pager: [phone]   Greater than 50%  of this time was spent counseling and coordinating care related to the above assessment and plan   15 min.

## 2019-01-12 DIAGNOSIS — F0280 Dementia in other diseases classified elsewhere without behavioral disturbance: Secondary | ICD-10-CM

## 2019-01-12 LAB — CULTURE, BLOOD (ROUTINE X 2): Culture: NO GROWTH

## 2019-01-12 LAB — GLUCOSE, CAPILLARY: Glucose-Capillary: 107 mg/dL — ABNORMAL HIGH (ref 70–99)

## 2019-01-12 NOTE — Progress Notes (Addendum)
Report called at this time to [place] and given to [place].  CarolAnn asked that Maurice Mckee remain in place when transferred.

## 2019-01-12 NOTE — Discharge Summary (Signed)
Physician Discharge Summary  Maurice Mckee [id number] DOB: 04-29-1926 DOA: 01/07/2019  PCP: Patient, No Pcp Per  Admit date: 01/07/2019 Discharge date: 01/12/2019  Admitted From: SNF  Disposition:  Hospice home   Recommendations for Outpatient Follow-up:  Please awaken him at mealtime to eat and assist with feeds   Discharge Condition:  stable   CODE STATUS:  DNR   Diet recommendation:  Pureed diet with thin liquids Consultations:  Palliative care   Discharge Diagnoses:  Principal Problem:   HCAP (healthcare-associated pneumonia) Active Problems:   Paroxysmal atrial fibrillation    Chronic systolic congestive heart failure ([place])   Dementia    Adult failure to thrive   Essential hypertension   ICD (implantable cardioverter-defibrillator) in place   Sacral decubitus ulcer, stage II ([place])   Anemia   Pleural effusion   Palliative care encounter    Brief Summary: Maurice Mckee is a 84 y.o.malewith medical history significant ofend stage dementia,possible seizure and was started on Keppra,HTN, CAD, CHF EF 45-50%, AICD placement. [facility] pulse ox in 80s (per ED provider's note). Placed on 2 L O2 in ED. Per son, the patient was on antibiotics for a pneumonia for 3- 4 days.    Hospital Course:   Principal Problem: HCAP (healthcare-associated pneumonia), acute respiratory failure with right pleural effusion - ? Aspiration- MRSA PCR +  - was on Levaquin as outpt- now on Vanc, Ceftraixone and Azithromycin - IR consulted to drain pleural effusion but ultrasound by IR yesterday showed there was not enough fluid to tap - SLP eval > Dys 1 diet with thin liquids - palliative care consulted in June >family wanted aggressive measures- now family has transitioned to comfort care- awaiting a hospice home bed   Active Problems: A-fib with RVR 8/21- Hypotension - A-fib with rates in 140s associated with  hypotension - given a bolus of NS and Amiodarone but no improvement in rate noted - Amiodarone infusion subsequently started and given 1 U PRBC - he had converted back to NSR - as BP has improved,   increased Coreg  - hold off on full anticoagulation- can continue Plavix - Amiodarone infusion d/c'd when family elected to move towards comfort care  Chronic systolic congestive heart failure  - no ECHO in Epic   Anemia - Hb dropping from 10 to 7.0 on 8/20- no acute bleeding- likely is dilutional- given 1 U PRBC overnight when he was hypotensive -  check anemia panel    SevereDementia with behavioral disturbance - only knows his name, speech nonsensical at times- often does not follow commands - needs total care - supportive care- on Remeron    H/o possible seizure in June - cont Keppra  ICD (implantable cardioverter-defibrillator) in place - this was turned off this week  Adult failure to thrive   Sacral decubitus ulcer, stage II ([place]) - wound care consulted and recommendations noted   Discharge Exam: Vitals:   01/12/19 0545 01/12/19 1350  BP: 117/85 140/77  Pulse: 64 60  Resp:  16  Temp:  97.6 F (36.4 C)  SpO2: 93% 93%   Vitals:   01/11/19 0527 01/11/19 1725 01/12/19 0545 01/12/19 1350  BP: (!) 157/92 121/81 117/85 140/77  Pulse: 84 60 64 60  Resp: 15   16  Temp: 97.6 F (36.4 C)   97.6 F (36.4 C)  TempSrc:    Axillary  SpO2: 97%  93% 93%  Weight:  Height:        General: Pt is  not in acute distress Cardiovascular: RRR, S1/S2 +, no rubs, no gallops Respiratory: CTA bilaterally, no wheezing, no rhonchi Abdominal: Soft, NT, ND, bowel sounds + Extremities: no edema, no cyanosis   Discharge Instructions   Allergies as of 01/12/2019      Reactions   Levaquin [levofloxacin] Other (See Comments)   Seizures!   Lactose Intolerance (gi) Other (See Comments)   "Allergic," per [facility]      Medication List    STOP taking these  medications   aspirin EC 81 MG tablet   atorvastatin 40 MG tablet Commonly known as: LIPITOR   cholecalciferol 1000 units tablet Commonly known as: VITAMIN D   clopidogrel 75 MG tablet Commonly known as: PLAVIX   Ensure Plus Liqd   ferrous sulfate 325 (65 FE) MG tablet   furosemide 20 MG tablet Commonly known as: LASIX   lactose free nutrition Liqd   levofloxacin 500 MG tablet Commonly known as: LEVAQUIN   multivitamin with minerals Tabs tablet   One-A-Day Proactive 65+ Tabs     TAKE these medications   acetaminophen 325 MG tablet Commonly known as: TYLENOL Take 650 mg by mouth every 6 (six) hours as needed (for pain or fever >101 F).   carvedilol 12.5 MG tablet Commonly known as: COREG Take 12.5 mg by mouth 2 (two) times daily with a meal.   collagenase ointment Commonly known as: SANTYL Apply 1 application topically daily.   docusate sodium 100 MG capsule Commonly known as: COLACE Take 1 capsule (100 mg total) by mouth 2 (two) times daily.   levETIRAcetam 250 MG tablet Commonly known as: KEPPRA Take 1 tablet (250 mg total) by mouth 2 (two) times daily.   mirtazapine 7.5 MG tablet Commonly known as: REMERON Take 7.5 mg by mouth at bedtime.   MUCINEX COLD CHILDRENS PO Take 15 mg by mouth daily. For seven days Started on 8.15.20 (dose is not clarified on MAR)       Allergies  Allergen Reactions  . Levaquin [Levofloxacin] Other (See Comments)    Seizures!  . Lactose Intolerance (Gi) Other (See Comments)    "Allergic," per [facility]     Procedures/Studies:    Dg Chest Port 1 View  Result Date: 01/07/2019 CLINICAL DATA:  Altered mental status EXAM: PORTABLE CHEST 1 VIEW COMPARISON:  Numerous radiographs most recently November 12, 2018, CT 04/11/2018 FINDINGS: New moderate right pleural effusion with thickening laterally, likely tracking into the minor fissure. There is persistent collapse of the right middle lobe. More focal patchy airspace disease is  remaining aerated portions of the right lower lobe. Pacer/defibrillator pack overlies the left chest wall with leads in the right atrium and cardiac apex. The aorta is calcified and tortuous. The remaining cardiomediastinal contours are unchanged from prior exam including numerous upper abdominal and mediastinal surgical clips. No acute osseous or soft tissue abnormality. IMPRESSION: New airspace disease in the right lower lung with moderate right pleural effusion tracking into minor fissure. Suspect persistent subtotal collapse of the right middle lobe Electronically Signed   By: Lovena Le M.D.   On: 01/07/2019 01:35   Ir US Chest  Result Date: 01/08/2019 CLINICAL DATA:  85 year old with altered mental status and request for thoracentesis. EXAM: CHEST ULTRASOUND COMPARISON:  Chest radiograph 01/07/2019 FINDINGS: Small amount of right pleural fluid is present. Could not identify a safe percutaneous window for thoracentesis. IMPRESSION: Small right pleural effusion.  Thoracentesis not performed. Electronically  Signed   By: Markus Daft M.D.   On: 01/08/2019 14:34     The results of significant diagnostics from this hospitalization (including imaging, microbiology, ancillary and laboratory) are listed below for reference.     Microbiology: Recent Results (from the past 240 hour(s))  Culture, blood (Routine X 2) w Reflex to ID Panel     Status: Abnormal   Collection Time: 01/07/19  2:30 AM   Specimen: BLOOD LEFT FOREARM  Result Value Ref Range Status   Specimen Description BLOOD LEFT FOREARM  Final   Special Requests   Final    BOTTLES DRAWN AEROBIC AND ANAEROBIC Blood Culture results may not be optimal due to an inadequate volume of blood received in culture bottles   Culture  Setup Time   Final    ANAEROBIC BOTTLE ONLY GRAM POSITIVE RODS CRITICAL RESULT CALLED TO, READ BACK BY AND VERIFIED WITH: J MILLEN PHARMD 01/07/19 1953 JDW AEROBIC BOTTLE ONLY GRAM POSITIVE COCCI Organism ID to  follow CRITICAL RESULT CALLED TO, READ BACK BY AND VERIFIED WITH: PHRMD @0536  01/08/19 BY S GEZAHEGN Performed at [facility], [place] [address], [place], [place] [address]    Culture (A)  Final    STAPHYLOCOCCUS SPECIES (COAGULASE NEGATIVE) THE SIGNIFICANCE OF ISOLATING THIS ORGANISM FROM A SINGLE SET OF BLOOD CULTURES WHEN MULTIPLE SETS ARE DRAWN IS UNCERTAIN. PLEASE NOTIFY THE MICROBIOLOGY DEPARTMENT WITHIN ONE WEEK IF SPECIATION AND SENSITIVITIES ARE REQUIRED. CLOSTRIDIUM PERFRINGENS    Report Status 01/10/2019 FINAL  Final  Culture, blood (Routine X 2) w Reflex to ID Panel     Status: None   Collection Time: 01/07/19  2:30 AM   Specimen: BLOOD LEFT FOREARM  Result Value Ref Range Status   Specimen Description BLOOD LEFT FOREARM  Final   Special Requests   Final    BOTTLES DRAWN AEROBIC ONLY Blood Culture results may not be optimal due to an inadequate volume of blood received in culture bottles   Culture   Final    NO GROWTH 5 DAYS Performed at [facility], [place] [address], [place], [place] [address]    Report Status 01/12/2019 FINAL  Final  Blood Culture ID Panel (Reflexed)     Status: Abnormal   Collection Time: 01/07/19  2:30 AM  Result Value Ref Range Status   Enterococcus species NOT DETECTED NOT DETECTED Final   Listeria monocytogenes NOT DETECTED NOT DETECTED Final   Staphylococcus species DETECTED (A) NOT DETECTED Final    Comment: Methicillin (oxacillin) resistant coagulase negative staphylococcus. Possible blood culture contaminant (unless isolated from more than one blood culture draw or clinical case suggests pathogenicity). No antibiotic treatment is indicated for blood  culture contaminants. CRITICAL RESULT CALLED TO, READ BACK BY AND VERIFIED WITH: PHRMD @0536  01/08/19 BY S GEZAHEGN    Staphylococcus aureus (BCID) NOT DETECTED NOT DETECTED Final   Methicillin resistance DETECTED (A) NOT DETECTED Final    Comment: CRITICAL RESULT CALLED TO, READ BACK BY  AND VERIFIED WITH: PHRMD @0536  01/08/19 BY S GEZAHEGN    Streptococcus species NOT DETECTED NOT DETECTED Final   Streptococcus agalactiae NOT DETECTED NOT DETECTED Final   Streptococcus pneumoniae NOT DETECTED NOT DETECTED Final   Streptococcus pyogenes NOT DETECTED NOT DETECTED Final   Acinetobacter baumannii NOT DETECTED NOT DETECTED Final   Enterobacteriaceae species NOT DETECTED NOT DETECTED Final   Enterobacter cloacae complex NOT DETECTED NOT DETECTED Final   Escherichia coli NOT DETECTED NOT DETECTED Final   Klebsiella oxytoca NOT  DETECTED NOT DETECTED Final   Klebsiella pneumoniae NOT DETECTED NOT DETECTED Final   Proteus species NOT DETECTED NOT DETECTED Final   Serratia marcescens NOT DETECTED NOT DETECTED Final   Haemophilus influenzae NOT DETECTED NOT DETECTED Final   Neisseria meningitidis NOT DETECTED NOT DETECTED Final   Pseudomonas aeruginosa NOT DETECTED NOT DETECTED Final   Candida albicans NOT DETECTED NOT DETECTED Final   Candida glabrata NOT DETECTED NOT DETECTED Final   Candida krusei NOT DETECTED NOT DETECTED Final   Candida parapsilosis NOT DETECTED NOT DETECTED Final   Candida tropicalis NOT DETECTED NOT DETECTED Final    Comment: Performed at [facility], [place] [address], [place], [place] [address]  SARS Coronavirus 2 [facility] order, Performed in [facility] hospital lab) Nasopharyngeal     Status: None   Collection Time: 01/07/19  2:47 AM   Specimen: Nasopharyngeal  Result Value Ref Range Status   SARS Coronavirus 2 NEGATIVE NEGATIVE Final    Comment: (NOTE) If result is NEGATIVE SARS-CoV-2 target nucleic acids are NOT DETECTED. The SARS-CoV-2 RNA is generally detectable in upper and lower  respiratory specimens during the acute phase of infection. The lowest  concentration of SARS-CoV-2 viral copies this assay can detect is 250  copies / mL. A negative result does not preclude SARS-CoV-2 infection  and should not be used as the sole basis for  treatment or other  patient management decisions.  A negative result may occur with  improper specimen collection / handling, submission of specimen other  than nasopharyngeal swab, presence of viral mutation(s) within the  areas targeted by this assay, and inadequate number of viral copies  (<250 copies / mL). A negative result must be combined with clinical  observations, patient history, and epidemiological information. If result is POSITIVE SARS-CoV-2 target nucleic acids are DETECTED. The SARS-CoV-2 RNA is generally detectable in upper and lower  respiratory specimens dur ing the acute phase of infection.  Positive  results are indicative of active infection with SARS-CoV-2.  Clinical  correlation with patient history and other diagnostic information is  necessary to determine patient infection status.  Positive results do  not rule out bacterial infection or co-infection with other viruses. If result is PRESUMPTIVE POSTIVE SARS-CoV-2 nucleic acids MAY BE PRESENT.   A presumptive positive result was obtained on the submitted specimen  and confirmed on repeat testing.  While 2019 novel coronavirus  (SARS-CoV-2) nucleic acids may be present in the submitted sample  additional confirmatory testing may be necessary for epidemiological  and / or clinical management purposes  to differentiate between  SARS-CoV-2 and other Sarbecovirus currently known to infect humans.  If clinically indicated additional testing with an alternate test  methodology [phone]) is advised. The SARS-CoV-2 RNA is generally  detectable in upper and lower respiratory sp ecimens during the acute  phase of infection. The expected result is Negative. Fact Sheet for Patients:  StrictlyIdeas.no Fact Sheet for Healthcare Providers: BankingDealers.co.za This test is not yet approved or cleared by the [place] FDA and has been authorized for detection and/or  diagnosis of SARS-CoV-2 by FDA under an Emergency Use Authorization (EUA).  This EUA will remain in effect (meaning this test can be used) for the duration of the COVID-19 declaration under Section 564(b)(1) of the Act, 21 U.S.C. section 360bbb-3(b)(1), unless the authorization is terminated or revoked sooner. Performed at [facility], [place] [address] [place], [place] [address]   MRSA PCR Screening     Status:  Abnormal   Collection Time: 01/07/19  3:35 AM   Specimen: Nasal Mucosa; Nasopharyngeal  Result Value Ref Range Status   MRSA by PCR POSITIVE (A) NEGATIVE Final    Comment:        The GeneXpert MRSA Assay (FDA approved for NASAL specimens only), is one component of a comprehensive MRSA colonization surveillance program. It is not intended to diagnose MRSA infection nor to guide or monitor treatment for MRSA infections. RESULT CALLED TO, READ BACK BY AND VERIFIED WITH: [facility] RN [id number] 01/07/2019 MITCHELL,L Performed at [facility], [place] [address] [place], [place] [address]   MRSA PCR Screening     Status: Abnormal   Collection Time: 01/09/19  8:45 AM   Specimen: Nasal Mucosa; Nasopharyngeal  Result Value Ref Range Status   MRSA by PCR POSITIVE (A) NEGATIVE Final    Comment:        The GeneXpert MRSA Assay (FDA approved for NASAL specimens only), is one component of a comprehensive MRSA colonization surveillance program. It is not intended to diagnose MRSA infection nor to guide or monitor treatment for MRSA infections. RESULT CALLED TO, READ BACK BY AND VERIFIED WITH: RN Garlon Hatchet [id number] Performed at [facility], [place] [address], [place], [place] [address]      Labs: BNP (last 3 results) Recent Labs    10/10/18 1559 01/07/19 0440  BNP 113.0* [id number]   Basic Metabolic Panel: Recent Labs  Lab 01/07/19 0111 01/08/19 0745 01/09/19 0808  NA 146* 149* 149*  K 3.8 3.6 3.7  CL 112* 118* 117*  CO2 25 24 23   GLUCOSE 141*  108* 113*  BUN 37* 42* 53*  CREATININE 0.96 0.69 1.04  CALCIUM 9.5 9.1 9.3  MG 1.9  --   --    Liver Function Tests: Recent Labs  Lab 01/07/19 0111  AST 18  ALT 25  ALKPHOS 80  BILITOT 0.4  PROT 5.5*  ALBUMIN 2.2*   No results for input(s): LIPASE, AMYLASE in the last 168 hours. No results for input(s): AMMONIA in the last 168 hours. CBC: Recent Labs  Lab 01/07/19 0111 01/08/19 0745 01/09/19 0808  WBC 17.7* 14.1* 21.6*  NEUTROABS 13.1*  --   --   HGB 10.1* 7.0* 8.8*  HCT 33.2* 23.2* 27.9*  MCV 90.2 91.3 89.4  PLT 248 220 239   Cardiac Enzymes: No results for input(s): CKTOTAL, CKMB, CKMBINDEX, TROPONINI in the last 168 hours. BNP: Invalid input(s): POCBNP CBG: Recent Labs  Lab 01/12/19 0037  GLUCAP 107*   D-Dimer No results for input(s): DDIMER in the last 72 hours. Hgb A1c No results for input(s): HGBA1C in the last 72 hours. Lipid Profile No results for input(s): CHOL, HDL, LDLCALC, TRIG, CHOLHDL, LDLDIRECT in the last 72 hours. Thyroid function studies No results for input(s): TSH, T4TOTAL, T3FREE, THYROIDAB in the last 72 hours.  Invalid input(s): FREET3 Anemia work up No results for input(s): VITAMINB12, FOLATE, FERRITIN, TIBC, IRON, RETICCTPCT in the last 72 hours. Urinalysis    Component Value Date/Time   COLORURINE YELLOW 01/07/2019 0245   APPEARANCEUR CLEAR 01/07/2019 0245   LABSPEC 1.023 01/07/2019 0245   PHURINE 5.0 01/07/2019 0245   GLUCOSEU NEGATIVE 01/07/2019 0245   HGBUR NEGATIVE 01/07/2019 0245   BILIRUBINUR NEGATIVE 01/07/2019 0245   KETONESUR NEGATIVE 01/07/2019 0245   PROTEINUR NEGATIVE 01/07/2019 0245   NITRITE NEGATIVE 01/07/2019 0245   LEUKOCYTESUR NEGATIVE 01/07/2019 0245   Sepsis Labs Invalid input(s): PROCALCITONIN,  WBC,  [place] [facility] Recent Results (from the past 240 hour(s))  Culture, blood (Routine X 2) w Reflex to ID Panel     Status: Abnormal   Collection Time: 01/07/19  2:30 AM   Specimen: BLOOD  LEFT FOREARM  Result Value Ref Range Status   Specimen Description BLOOD LEFT FOREARM  Final   Special Requests   Final    BOTTLES DRAWN AEROBIC AND ANAEROBIC Blood Culture results may not be optimal due to an inadequate volume of blood received in culture bottles   Culture  Setup Time   Final    ANAEROBIC BOTTLE ONLY GRAM POSITIVE RODS CRITICAL RESULT CALLED TO, READ BACK BY AND VERIFIED WITH: J MILLEN PHARMD 01/07/19 1953 JDW AEROBIC BOTTLE ONLY GRAM POSITIVE COCCI Organism ID to follow CRITICAL RESULT CALLED TO, READ BACK BY AND VERIFIED WITH: PHRMD @0536  01/08/19 BY S GEZAHEGN Performed at [facility], [place] [address] [place], [place] [address]    Culture (A)  Final    STAPHYLOCOCCUS SPECIES (COAGULASE NEGATIVE) THE SIGNIFICANCE OF ISOLATING THIS ORGANISM FROM A SINGLE SET OF BLOOD CULTURES WHEN MULTIPLE SETS ARE DRAWN IS UNCERTAIN. PLEASE NOTIFY THE MICROBIOLOGY DEPARTMENT WITHIN ONE WEEK IF SPECIATION AND SENSITIVITIES ARE REQUIRED. CLOSTRIDIUM PERFRINGENS    Report Status 01/10/2019 FINAL  Final  Culture, blood (Routine X 2) w Reflex to ID Panel     Status: None   Collection Time: 01/07/19  2:30 AM   Specimen: BLOOD LEFT FOREARM  Result Value Ref Range Status   Specimen Description BLOOD LEFT FOREARM  Final   Special Requests   Final    BOTTLES DRAWN AEROBIC ONLY Blood Culture results may not be optimal due to an inadequate volume of blood received in culture bottles   Culture   Final    NO GROWTH 5 DAYS Performed at [facility], [place] [address] [place], [place] [address]    Report Status 01/12/2019 FINAL  Final  Blood Culture ID Panel (Reflexed)     Status: Abnormal   Collection Time: 01/07/19  2:30 AM  Result Value Ref Range Status   Enterococcus species NOT DETECTED NOT DETECTED Final   Listeria monocytogenes NOT DETECTED NOT DETECTED Final   Staphylococcus species DETECTED (A) NOT DETECTED Final    Comment: Methicillin (oxacillin) resistant coagulase  negative staphylococcus. Possible blood culture contaminant (unless isolated from more than one blood culture draw or clinical case suggests pathogenicity). No antibiotic treatment is indicated for blood  culture contaminants. CRITICAL RESULT CALLED TO, READ BACK BY AND VERIFIED WITH: PHRMD @0536  01/08/19 BY S GEZAHEGN    Staphylococcus aureus (BCID) NOT DETECTED NOT DETECTED Final   Methicillin resistance DETECTED (A) NOT DETECTED Final    Comment: CRITICAL RESULT CALLED TO, READ BACK BY AND VERIFIED WITH: PHRMD @0536  01/08/19 BY S GEZAHEGN    Streptococcus species NOT DETECTED NOT DETECTED Final   Streptococcus agalactiae NOT DETECTED NOT DETECTED Final   Streptococcus pneumoniae NOT DETECTED NOT DETECTED Final   Streptococcus pyogenes NOT DETECTED NOT DETECTED Final   Acinetobacter baumannii NOT DETECTED NOT DETECTED Final   Enterobacteriaceae species NOT DETECTED NOT DETECTED Final   Enterobacter cloacae complex NOT DETECTED NOT DETECTED Final   Escherichia coli NOT DETECTED NOT DETECTED Final   Klebsiella oxytoca NOT DETECTED NOT DETECTED Final   Klebsiella pneumoniae NOT DETECTED NOT DETECTED Final   Proteus species NOT DETECTED NOT DETECTED Final   Serratia marcescens NOT DETECTED NOT DETECTED Final   Haemophilus influenzae NOT DETECTED NOT DETECTED  Final   Neisseria meningitidis NOT DETECTED NOT DETECTED Final   Pseudomonas aeruginosa NOT DETECTED NOT DETECTED Final   Candida albicans NOT DETECTED NOT DETECTED Final   Candida glabrata NOT DETECTED NOT DETECTED Final   Candida krusei NOT DETECTED NOT DETECTED Final   Candida parapsilosis NOT DETECTED NOT DETECTED Final   Candida tropicalis NOT DETECTED NOT DETECTED Final    Comment: Performed at [facility], [place] [address], [place], [place] [address]  SARS Coronavirus 2 [facility] order, Performed in [facility] hospital lab) Nasopharyngeal     Status: None   Collection Time: 01/07/19  2:47 AM   Specimen: Nasopharyngeal   Result Value Ref Range Status   SARS Coronavirus 2 NEGATIVE NEGATIVE Final    Comment: (NOTE) If result is NEGATIVE SARS-CoV-2 target nucleic acids are NOT DETECTED. The SARS-CoV-2 RNA is generally detectable in upper and lower  respiratory specimens during the acute phase of infection. The lowest  concentration of SARS-CoV-2 viral copies this assay can detect is 250  copies / mL. A negative result does not preclude SARS-CoV-2 infection  and should not be used as the sole basis for treatment or other  patient management decisions.  A negative result may occur with  improper specimen collection / handling, submission of specimen other  than nasopharyngeal swab, presence of viral mutation(s) within the  areas targeted by this assay, and inadequate number of viral copies  (<250 copies / mL). A negative result must be combined with clinical  observations, patient history, and epidemiological information. If result is POSITIVE SARS-CoV-2 target nucleic acids are DETECTED. The SARS-CoV-2 RNA is generally detectable in upper and lower  respiratory specimens dur ing the acute phase of infection.  Positive  results are indicative of active infection with SARS-CoV-2.  Clinical  correlation with patient history and other diagnostic information is  necessary to determine patient infection status.  Positive results do  not rule out bacterial infection or co-infection with other viruses. If result is PRESUMPTIVE POSTIVE SARS-CoV-2 nucleic acids MAY BE PRESENT.   A presumptive positive result was obtained on the submitted specimen  and confirmed on repeat testing.  While 2019 novel coronavirus  (SARS-CoV-2) nucleic acids may be present in the submitted sample  additional confirmatory testing may be necessary for epidemiological  and / or clinical management purposes  to differentiate between  SARS-CoV-2 and other Sarbecovirus currently known to infect humans.  If clinically indicated additional  testing with an alternate test  methodology [phone]) is advised. The SARS-CoV-2 RNA is generally  detectable in upper and lower respiratory sp ecimens during the acute  phase of infection. The expected result is Negative. Fact Sheet for Patients:  StrictlyIdeas.no Fact Sheet for Healthcare Providers: BankingDealers.co.za This test is not yet approved or cleared by the [place] FDA and has been authorized for detection and/or diagnosis of SARS-CoV-2 by FDA under an Emergency Use Authorization (EUA).  This EUA will remain in effect (meaning this test can be used) for the duration of the COVID-19 declaration under Section 564(b)(1) of the Act, 21 U.S.C. section 360bbb-3(b)(1), unless the authorization is terminated or revoked sooner. Performed at [facility], [place] [address], [place], [place] [address]   MRSA PCR Screening     Status: Abnormal   Collection Time: 01/07/19  3:35 AM   Specimen: Nasal Mucosa; Nasopharyngeal  Result Value Ref Range Status   MRSA by PCR POSITIVE (A) NEGATIVE Final    Comment:  The GeneXpert MRSA Assay (FDA approved for NASAL specimens only), is one component of a comprehensive MRSA colonization surveillance program. It is not intended to diagnose MRSA infection nor to guide or monitor treatment for MRSA infections. RESULT CALLED TO, READ BACK BY AND VERIFIED WITH: [facility] RN [id number] 01/07/2019 MITCHELL,L Performed at [facility], [place] [address], [place], [place] [address]   MRSA PCR Screening     Status: Abnormal   Collection Time: 01/09/19  8:45 AM   Specimen: Nasal Mucosa; Nasopharyngeal  Result Value Ref Range Status   MRSA by PCR POSITIVE (A) NEGATIVE Final    Comment:        The GeneXpert MRSA Assay (FDA approved for NASAL specimens only), is one component of a comprehensive MRSA colonization surveillance program. It is not intended to diagnose MRSA infection nor  to guide or monitor treatment for MRSA infections. RESULT CALLED TO, READ BACK BY AND VERIFIED WITH: RN Garlon Hatchet [id number] MLM Performed at [facility], [place] [address] [place], [place] [address]      Time coordinating discharge in minutes: 43  SIGNED:   Debbe Odea, MD  [facility] 01/12/2019, 2:06 PM Pager   If 7PM-7AM, please contact night-coverage [email or web address] Password [email or web address]

## 2019-01-12 NOTE — Plan of Care (Signed)

## 2019-01-12 NOTE — Care Management Important Message (Signed)
Important Message  Patient Details  Name: Maurice Mckee MRN: [id number] Date of Birth: 1926/03/07   Medicare Important Message Given:  Yes     Maurice Mckee 01/12/2019, 9:13 AM

## 2019-01-12 NOTE — TOC Transition Note (Signed)
Transition of Care [facility]) - CM/SW Discharge Note   Patient Details  Name: Maurice Mckee MRN: [id number] Date of Birth: [birth year]  Transition of Care [facility]) CM/SW Contact:  Sharin Mons, RN Phone Number: 01/12/2019, 2:12 PM   Clinical Narrative:    Patient will DC to: [facility]/ residential hospice Anticipated DC date: 01/12/2019 Family notified: Francee Piccolo( son) Transport by: Corey Harold  NCM received call from [place] of pt's approval for residential hospice. NCM made MD aware. Per MD patient ready for d/c to [facility]. RN, patient, patient's family notified of DC. RN to call report prior to discharge [phone]).  DC packet on front of  chart. A 4:30 pm ambulance transport requested for patient.   RNCM will sign off for now as intervention is no longer needed. Please consult [place] again if new needs arise.  [place] Reuter     [phone]        Final next level of care: Hospice Medical Facility([facility]) Barriers to Discharge: No Barriers Identified   Patient Goals and CMS Choice Patient states their goals for this hospitalization and ongoing recovery are:: Pt will transition to comfort care CMS Medicare.gov Compare Post Acute Care list provided to:: Patient Represenative (must comment) Choice offered to / list presented to : Adult Children  Discharge Placement                Patient to be transferred to facility by: [facility] Name of family member notified: Francee Piccolo ( son) Patient and family notified of of transfer: 01/12/19  Discharge Plan and Services In-house Referral: Clinical Social Work Discharge Planning Services: NA Post Acute Care Choice: Hospice          DME Arranged: N/A DME Agency: NA       HH Arranged: NA HH Agency: NA        Social Determinants of Health (SDOH) Interventions     Readmission Risk Interventions No flowsheet data found.

## 2019-01-20 DEATH — deceased

## 2019-11-26 IMAGING — DX PORTABLE CHEST - 1 VIEW
1 series · 1 of 1 positions shown · non-contrast
Comparison: Chest radiograph from one day prior.

CLINICAL DATA: Pneumothorax

EXAM:
PORTABLE CHEST 1 VIEW

[chest ap]
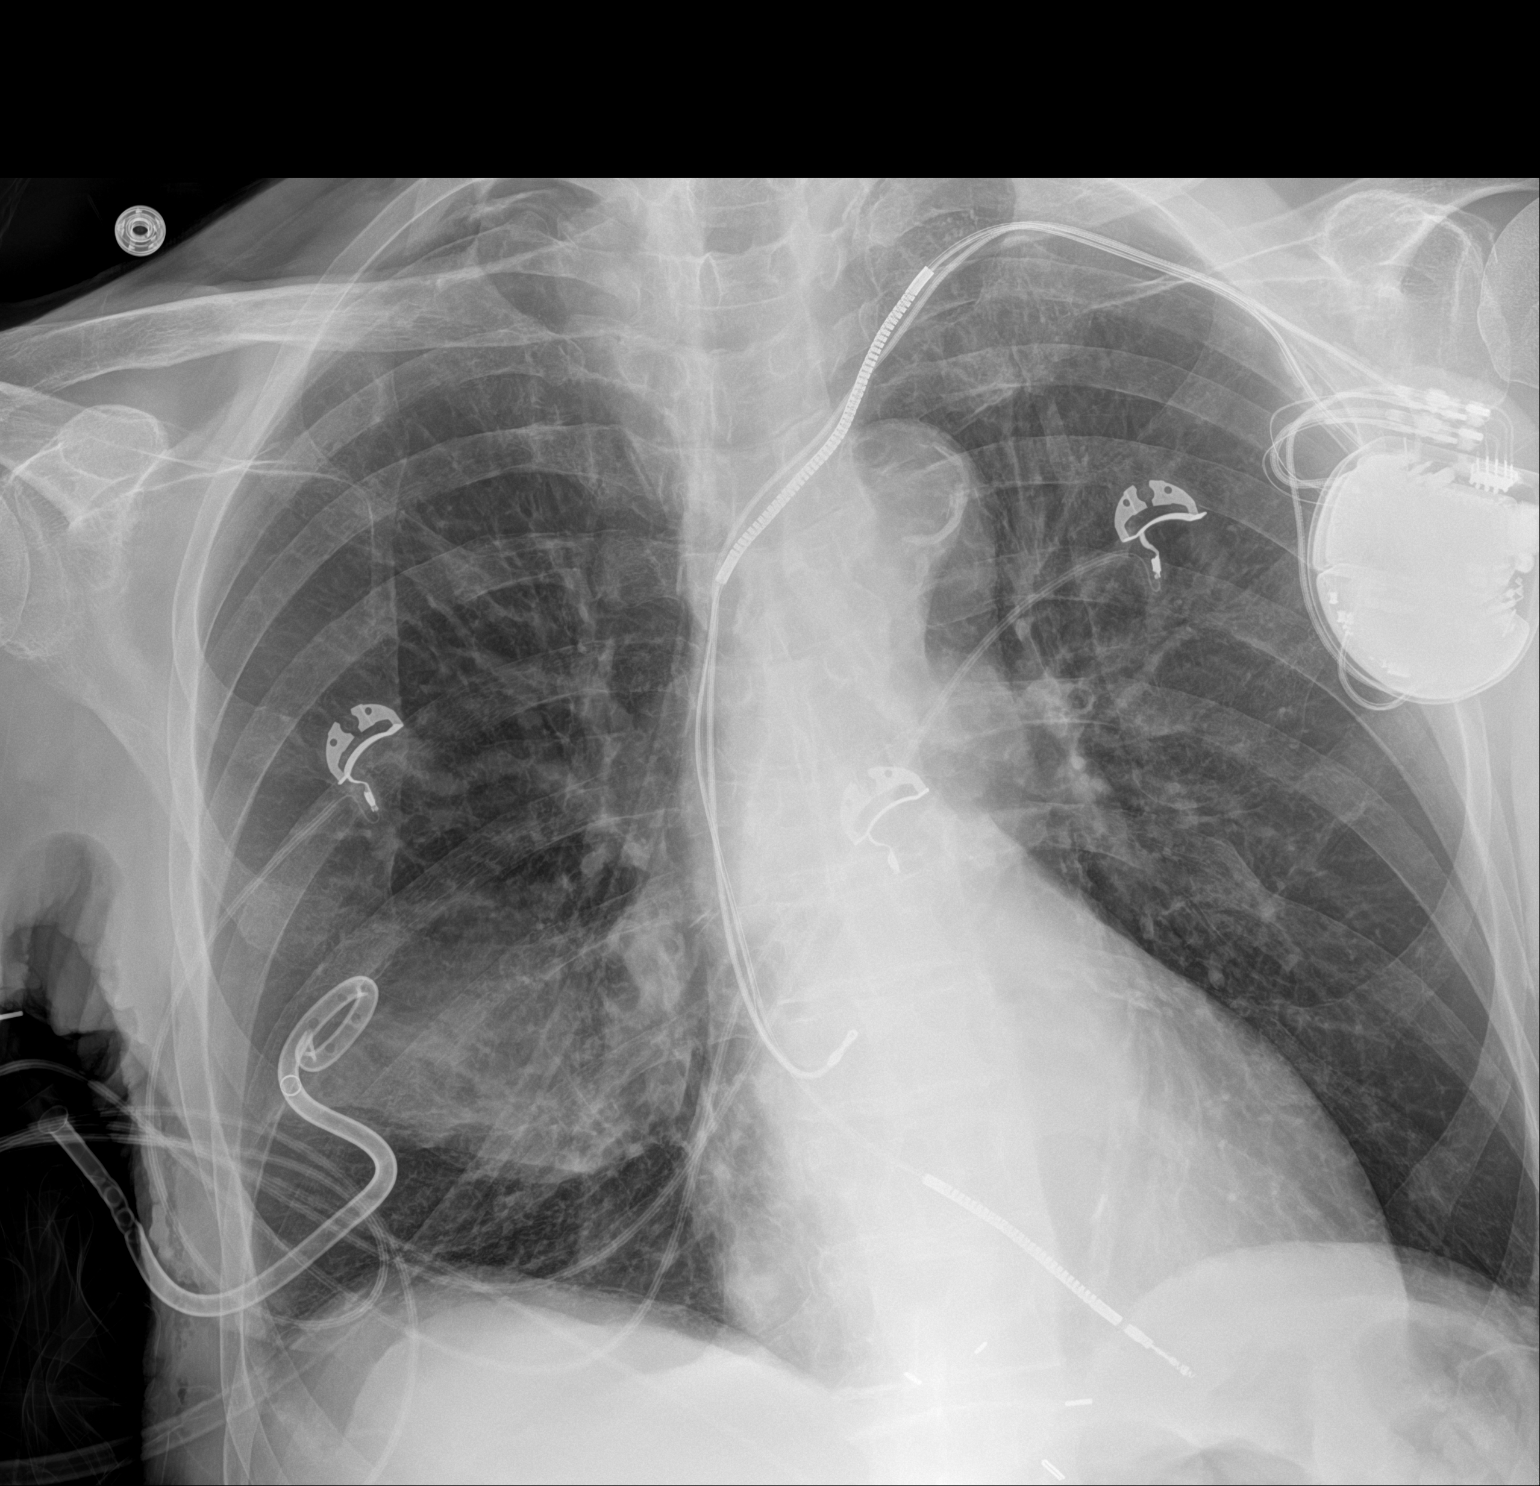

[1 of 1 positions shown; findings below may reference images not displayed]

FINDINGS: Stable configuration of 2 lead left subclavian ICD. Stable right
basilar pigtail chest tube. Stable cardiomediastinal silhouette with
normal heart size. Tiny right apical pneumothorax, less than 5%, not
definitely seen on prior. No left pneumothorax. No pleural effusion.
Patchy lower right lung opacity is similar. No pulmonary edema. No
mediastinal shift.
IMPRESSION: 1. Tiny right apical pneumothorax, less than 5%, not definitely seen
on prior. Right pigtail chest tube in place. No mediastinal shift.
2. Stable patchy lower right lung opacity.

## 2019-11-27 IMAGING — DX CHEST  1 VIEW
1 series · 1 of 1 positions shown · non-contrast
Comparison: Single-view of the chest earlier today and 10/18/2018.

CLINICAL DATA: Status post right chest tube removal today.

EXAM:
CHEST  1 VIEW

[chest ap]
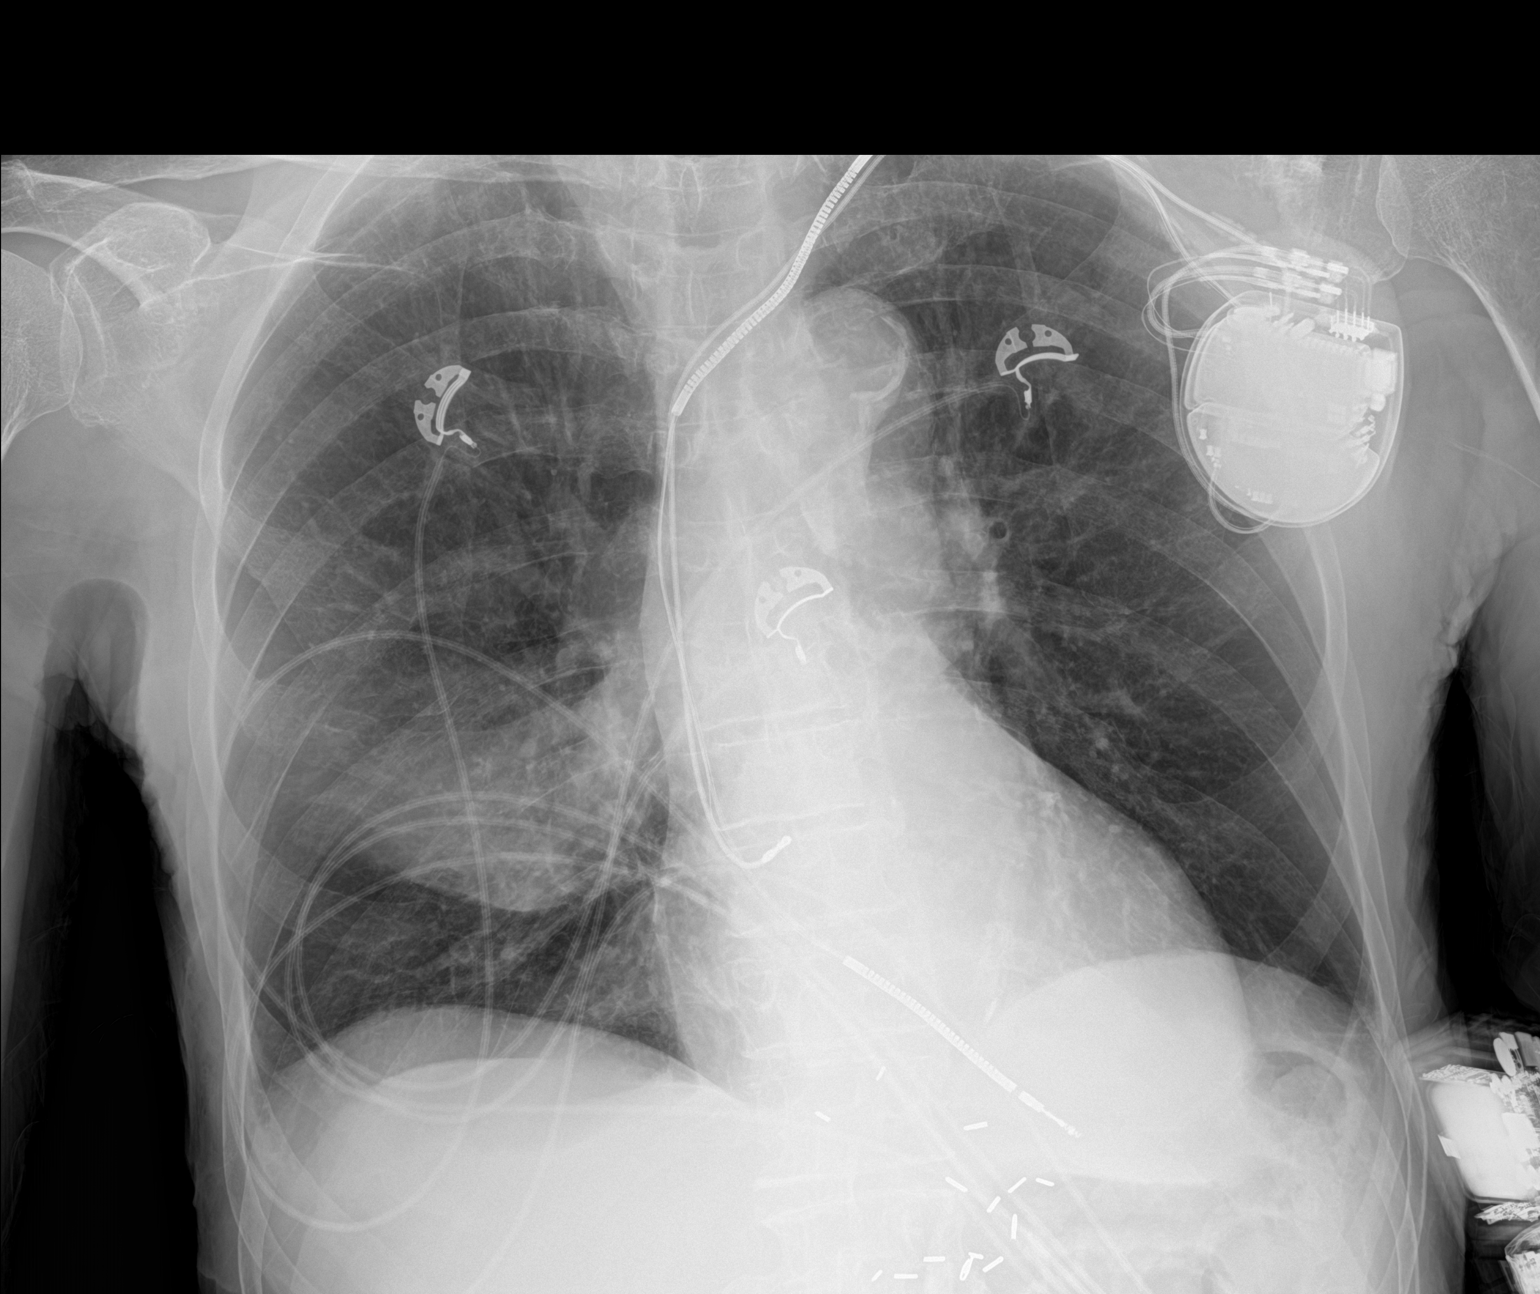

[1 of 1 positions shown; findings below may reference images not displayed]

FINDINGS: Pigtail catheter has been removed from the right chest. No
pneumothorax. Right infrahilar opacity is unchanged. Left lung
clear. Heart size normal. Aortic atherosclerosis noted. No pleural
effusion. AICD in place.
IMPRESSION: Negative for pneumothorax after chest tube removal. No other change
since the exam earlier today.
